# Patient Record
Sex: Male | Born: 1935 | Race: White | Hispanic: No | Marital: Married | Smoking: Never smoker
Health system: Southern US, Community
[De-identification: ages and names within clinical notes are randomized; demographics above are authoritative.]

## PROBLEM LIST (undated history)

## (undated) DIAGNOSIS — I4891 Unspecified atrial fibrillation: Secondary | ICD-10-CM

## (undated) DIAGNOSIS — K279 Peptic ulcer, site unspecified, unspecified as acute or chronic, without hemorrhage or perforation: Secondary | ICD-10-CM

## (undated) DIAGNOSIS — I119 Hypertensive heart disease without heart failure: Secondary | ICD-10-CM

## (undated) DIAGNOSIS — I10 Essential (primary) hypertension: Secondary | ICD-10-CM

## (undated) DIAGNOSIS — I5042 Chronic combined systolic (congestive) and diastolic (congestive) heart failure: Secondary | ICD-10-CM

## (undated) DIAGNOSIS — J90 Pleural effusion, not elsewhere classified: Secondary | ICD-10-CM

## (undated) DIAGNOSIS — I495 Sick sinus syndrome: Secondary | ICD-10-CM

## (undated) DIAGNOSIS — Z950 Presence of cardiac pacemaker: Secondary | ICD-10-CM

## (undated) DIAGNOSIS — N183 Chronic kidney disease, stage 3 unspecified: Secondary | ICD-10-CM

## (undated) DIAGNOSIS — E669 Obesity, unspecified: Secondary | ICD-10-CM

## (undated) DIAGNOSIS — I255 Ischemic cardiomyopathy: Secondary | ICD-10-CM

## (undated) DIAGNOSIS — E119 Type 2 diabetes mellitus without complications: Secondary | ICD-10-CM

## (undated) DIAGNOSIS — N400 Enlarged prostate without lower urinary tract symptoms: Secondary | ICD-10-CM

## (undated) DIAGNOSIS — I2699 Other pulmonary embolism without acute cor pulmonale: Secondary | ICD-10-CM

## (undated) DIAGNOSIS — D649 Anemia, unspecified: Secondary | ICD-10-CM

## (undated) DIAGNOSIS — I219 Acute myocardial infarction, unspecified: Secondary | ICD-10-CM

## (undated) DIAGNOSIS — I340 Nonrheumatic mitral (valve) insufficiency: Secondary | ICD-10-CM

## (undated) DIAGNOSIS — K449 Diaphragmatic hernia without obstruction or gangrene: Secondary | ICD-10-CM

## (undated) DIAGNOSIS — F419 Anxiety disorder, unspecified: Secondary | ICD-10-CM

---

## 2006-05-07 ENCOUNTER — Inpatient Hospital Stay: Payer: Self-pay | Admitting: Internal Medicine

## 2006-05-07 ENCOUNTER — Other Ambulatory Visit: Payer: Self-pay

## 2010-07-28 ENCOUNTER — Ambulatory Visit: Payer: Self-pay | Admitting: Gastroenterology

## 2010-07-29 LAB — PATHOLOGY REPORT

## 2014-04-16 ENCOUNTER — Inpatient Hospital Stay: Payer: Self-pay | Admitting: Internal Medicine

## 2014-04-16 LAB — COMPREHENSIVE METABOLIC PANEL
ALBUMIN: 3.6 g/dL (ref 3.4–5.0)
ALK PHOS: 64 U/L
ALT: 49 U/L (ref 12–78)
ANION GAP: 5 — AB (ref 7–16)
AST: 26 U/L (ref 15–37)
BILIRUBIN TOTAL: 1 mg/dL (ref 0.2–1.0)
BUN: 46 mg/dL — ABNORMAL HIGH (ref 7–18)
CHLORIDE: 112 mmol/L — AB (ref 98–107)
Calcium, Total: 8.4 mg/dL — ABNORMAL LOW (ref 8.5–10.1)
Co2: 22 mmol/L (ref 21–32)
Creatinine: 1.93 mg/dL — ABNORMAL HIGH (ref 0.60–1.30)
EGFR (African American): 37 — ABNORMAL LOW
EGFR (Non-African Amer.): 32 — ABNORMAL LOW
Glucose: 172 mg/dL — ABNORMAL HIGH (ref 65–99)
Osmolality: 294 (ref 275–301)
Potassium: 5.4 mmol/L — ABNORMAL HIGH (ref 3.5–5.1)
SODIUM: 139 mmol/L (ref 136–145)
Total Protein: 6.7 g/dL (ref 6.4–8.2)

## 2014-04-16 LAB — CBC WITH DIFFERENTIAL/PLATELET
Basophil #: 0.1 10*3/uL (ref 0.0–0.1)
Basophil %: 0.8 %
EOS PCT: 2.9 %
Eosinophil #: 0.2 10*3/uL (ref 0.0–0.7)
HCT: 26.8 % — AB (ref 40.0–52.0)
HGB: 8.9 g/dL — ABNORMAL LOW (ref 13.0–18.0)
Lymphocyte #: 1.2 10*3/uL (ref 1.0–3.6)
Lymphocyte %: 15.3 %
MCH: 32 pg (ref 26.0–34.0)
MCHC: 33.3 g/dL (ref 32.0–36.0)
MCV: 96 fL (ref 80–100)
MONOS PCT: 5.6 %
Monocyte #: 0.4 x10 3/mm (ref 0.2–1.0)
Neutrophil #: 5.8 10*3/uL (ref 1.4–6.5)
Neutrophil %: 75.4 %
Platelet: 135 10*3/uL — ABNORMAL LOW (ref 150–440)
RBC: 2.79 10*6/uL — AB (ref 4.40–5.90)
RDW: 14.9 % — ABNORMAL HIGH (ref 11.5–14.5)
WBC: 7.6 10*3/uL (ref 3.8–10.6)

## 2014-04-16 LAB — TROPONIN I: Troponin-I: <0.02 ng/mL

## 2014-04-16 LAB — PRO B NATRIURETIC PEPTIDE: B-Type Natriuretic Peptide: 8307 pg/mL — ABNORMAL HIGH (ref 0–450)

## 2014-04-17 LAB — CBC WITH DIFFERENTIAL/PLATELET
BASOS ABS: 0.1 10*3/uL (ref 0.0–0.1)
Basophil %: 0.8 %
EOS ABS: 0.2 10*3/uL (ref 0.0–0.7)
Eosinophil %: 2.6 %
HCT: 25.4 % — AB (ref 40.0–52.0)
HGB: 8.4 g/dL — AB (ref 13.0–18.0)
Lymphocyte #: 1.3 10*3/uL (ref 1.0–3.6)
Lymphocyte %: 19.4 %
MCH: 31.3 pg (ref 26.0–34.0)
MCHC: 33 g/dL (ref 32.0–36.0)
MCV: 95 fL (ref 80–100)
MONOS PCT: 6.8 %
Monocyte #: 0.5 x10 3/mm (ref 0.2–1.0)
Neutrophil #: 4.7 10*3/uL (ref 1.4–6.5)
Neutrophil %: 70.4 %
Platelet: 122 10*3/uL — ABNORMAL LOW (ref 150–440)
RBC: 2.68 10*6/uL — ABNORMAL LOW (ref 4.40–5.90)
RDW: 14.6 % — ABNORMAL HIGH (ref 11.5–14.5)
WBC: 6.7 10*3/uL (ref 3.8–10.6)

## 2014-04-17 LAB — IRON AND TIBC
IRON SATURATION: 8 %
IRON: 21 ug/dL — AB (ref 65–175)
Iron Bind.Cap.(Total): 248 ug/dL — ABNORMAL LOW (ref 250–450)
Unbound Iron-Bind.Cap.: 227 ug/dL

## 2014-04-17 LAB — BASIC METABOLIC PANEL
Anion Gap: 4 — ABNORMAL LOW (ref 7–16)
BUN: 46 mg/dL — ABNORMAL HIGH (ref 7–18)
CALCIUM: 8.5 mg/dL (ref 8.5–10.1)
CHLORIDE: 109 mmol/L — AB (ref 98–107)
CO2: 27 mmol/L (ref 21–32)
Creatinine: 1.84 mg/dL — ABNORMAL HIGH (ref 0.60–1.30)
EGFR (Non-African Amer.): 34 — ABNORMAL LOW
GFR CALC AF AMER: 39 — AB
Glucose: 105 mg/dL — ABNORMAL HIGH (ref 65–99)
Osmolality: 292 (ref 275–301)
Potassium: 4.6 mmol/L (ref 3.5–5.1)
Sodium: 140 mmol/L (ref 136–145)

## 2014-04-17 LAB — FERRITIN: Ferritin (ARMC): 45 ng/mL (ref 8–388)

## 2014-04-18 LAB — CBC WITH DIFFERENTIAL/PLATELET
BASOS PCT: 1.1 %
Basophil #: 0.1 10*3/uL (ref 0.0–0.1)
EOS PCT: 3.5 %
Eosinophil #: 0.2 10*3/uL (ref 0.0–0.7)
HCT: 24.6 % — ABNORMAL LOW (ref 40.0–52.0)
HGB: 8.6 g/dL — ABNORMAL LOW (ref 13.0–18.0)
Lymphocyte #: 1.4 10*3/uL (ref 1.0–3.6)
Lymphocyte %: 22.5 %
MCH: 32.4 pg (ref 26.0–34.0)
MCHC: 34.9 g/dL (ref 32.0–36.0)
MCV: 93 fL (ref 80–100)
Monocyte #: 0.4 x10 3/mm (ref 0.2–1.0)
Monocyte %: 6.4 %
NEUTROS ABS: 4 10*3/uL (ref 1.4–6.5)
NEUTROS PCT: 66.5 %
Platelet: 132 10*3/uL — ABNORMAL LOW (ref 150–440)
RBC: 2.66 10*6/uL — ABNORMAL LOW (ref 4.40–5.90)
RDW: 14.4 % (ref 11.5–14.5)
WBC: 6 10*3/uL (ref 3.8–10.6)

## 2014-04-18 LAB — BASIC METABOLIC PANEL
Anion Gap: 6 — ABNORMAL LOW (ref 7–16)
BUN: 42 mg/dL — ABNORMAL HIGH (ref 7–18)
Calcium, Total: 8.4 mg/dL — ABNORMAL LOW (ref 8.5–10.1)
Chloride: 106 mmol/L (ref 98–107)
Co2: 27 mmol/L (ref 21–32)
Creatinine: 1.77 mg/dL — ABNORMAL HIGH (ref 0.60–1.30)
GFR CALC AF AMER: 41 — AB
GFR CALC NON AF AMER: 35 — AB
Glucose: 122 mg/dL — ABNORMAL HIGH (ref 65–99)
Osmolality: 289 (ref 275–301)
POTASSIUM: 4.3 mmol/L (ref 3.5–5.1)
SODIUM: 139 mmol/L (ref 136–145)

## 2014-04-18 LAB — PROTIME-INR
INR: 1.2
PROTHROMBIN TIME: 14.8 s — AB (ref 11.5–14.7)

## 2014-04-18 LAB — APTT: ACTIVATED PTT: 37.7 s — AB (ref 23.6–35.9)

## 2014-04-19 HISTORY — PX: INSERT / REPLACE / REMOVE PACEMAKER: SUR710

## 2014-04-19 LAB — BASIC METABOLIC PANEL
ANION GAP: 8 (ref 7–16)
BUN: 32 mg/dL — ABNORMAL HIGH (ref 7–18)
CALCIUM: 8.9 mg/dL (ref 8.5–10.1)
CHLORIDE: 103 mmol/L (ref 98–107)
Co2: 29 mmol/L (ref 21–32)
Creatinine: 1.58 mg/dL — ABNORMAL HIGH (ref 0.60–1.30)
GFR CALC AF AMER: 47 — AB
GFR CALC NON AF AMER: 40 — AB
GLUCOSE: 113 mg/dL — AB (ref 65–99)
Osmolality: 287 (ref 275–301)
Potassium: 3.9 mmol/L (ref 3.5–5.1)
SODIUM: 140 mmol/L (ref 136–145)

## 2014-04-19 LAB — HEMOGLOBIN: HGB: 10.3 g/dL — ABNORMAL LOW (ref 13.0–18.0)

## 2014-06-25 DIAGNOSIS — I495 Sick sinus syndrome: Secondary | ICD-10-CM | POA: Insufficient documentation

## 2014-06-25 DIAGNOSIS — I2510 Atherosclerotic heart disease of native coronary artery without angina pectoris: Secondary | ICD-10-CM | POA: Insufficient documentation

## 2014-06-25 DIAGNOSIS — E119 Type 2 diabetes mellitus without complications: Secondary | ICD-10-CM | POA: Insufficient documentation

## 2014-06-25 DIAGNOSIS — D649 Anemia, unspecified: Secondary | ICD-10-CM | POA: Insufficient documentation

## 2015-01-14 DIAGNOSIS — I482 Chronic atrial fibrillation, unspecified: Secondary | ICD-10-CM | POA: Insufficient documentation

## 2015-01-14 DIAGNOSIS — N183 Chronic kidney disease, stage 3 unspecified: Secondary | ICD-10-CM | POA: Insufficient documentation

## 2015-02-19 ENCOUNTER — Ambulatory Visit: Payer: Self-pay | Admitting: Internal Medicine

## 2015-02-25 ENCOUNTER — Ambulatory Visit
Admit: 2015-02-25 | Disposition: A | Discharge status: Home / Self Care | Payer: Self-pay | Attending: Internal Medicine | Admitting: Internal Medicine

## 2015-03-28 ENCOUNTER — Ambulatory Visit
Admit: 2015-03-28 | Disposition: A | Discharge status: Home / Self Care | Payer: Self-pay | Attending: Internal Medicine | Admitting: Internal Medicine

## 2015-04-19 NOTE — Op Note (Signed)
PATIENT NAME:  Donald Henry, Donald Henry MR#:  [id number] DATE OF BIRTH:  17-Sep-1930  DATE OF PROCEDURE:  04/18/2014  PRIMARY CARE PHYSICIAN:  Dr. Gilford Rile.  PREPROCEDURE DIAGNOSIS: Sick sinus syndrome, atrial fibrillation.   PROCEDURE: Single-chamber pacemaker implantation.   POSTPROCEDURE DIAGNOSIS: Ventricular pacing.   INDICATION: The patient is an 81 year old gentleman who presents with a 3 day history of shortness of breath. In the Emergency Room, the patient was noted to be bradycardic. EKG revealed atrial fibrillation with a slow ventricular rate in the 30s. Following admission clonidine was withheld, however, the patient has remained bradycardic with symptoms of shortness of breath. The procedure, risks, benefits, and alternatives of pacemaker implantation were explained to the patient and informed written consent was obtained.   DESCRIPTION OF PROCEDURE: He was brought to the operating room in a fasting state. The left pectoral region was prepped and draped in the usual sterile manner. Anesthesia was obtained with 1% Xylocaine locally. A 6 cm incision was performed over the left pectoral region. The pacemaker pocket was generated by electrocautery and blunt dissection. Access was obtained to the left subclavian vein by fine needle aspiration. A ventricular lead (Medtronic [id number]) was positioned to right ventricular apex, apical septum. After proper thresholds were obtained, the lead was sutured in place. The lead was connected to a single-chamber rate responsive pacemaker generator (Medtronic Adapta [id number]). The pacemaker pocket was irrigated with gentamicin solution. The pacemaker generator was positioned into the pocket and the pocket was closed with 2-0 and 4-0 Vicryl, respectively. Steri-Strips and pressure dressing were applied.   ____________________________ Isaias Cowman, MD ap:sb D: 04/18/2014 14:29:06 ET T: 04/18/2014 14:47:44 ET JOB#: [id number]  cc: Isaias Cowman, MD,  <Dictator> Isaias Cowman MD ELECTRONICALLY SIGNED 04/30/2014 12:46

## 2015-04-19 NOTE — Consult Note (Signed)
General Aspect 81 year old male admitted with 3 day history of shortness of breath with EKG showing new onset fibrillation with slow ventricular response in the 30s as well as chest x-ray showing  evidence for congestive heart failure. Patient states he went out for the paper this morning and thought he would not make it back to the house.  As long as he sitting or lying quietly he is not symptomatic. He denies having any any evidence of heart failure in the past as well.  He is on atenolol but no other chronotropic type drugs, which he has been on for years,his last dose was last night.  He also has chronic kidney disease with a creatinine around 1.93. Otherwise is been in his usual state of health. No sensation of dizziness or presyncope. Troponin negative patient is anemic, with hemoglobin of 8.9, potassium 5.4. Echocardiogram is pending.   Physical Exam:  GEN well developed, no acute distress   HEENT pink conjunctivae   NECK supple   RESP normal resp effort   CARD Irregular rate and rhythm  Bradycardic   ABD denies tenderness  soft   EXTR negative edema   SKIN skin turgor decreased   NEURO cranial nerves intact, motor/sensory function intact   PSYCH alert, A+O to time, place, person, good insight   Review of Systems:  Subjective/Chief Complaint Shortness of breath   Respiratory: Short of breath  x3 days   Cardiovascular: Dyspnea   Review of Systems: All other systems were reviewed and found to be negative   Medications/Allergies Reviewed Medications/Allergies reviewed   Family & Social History:  Family and Social History:  Family History Non-Contributory   Social History negative tobacco, negative ETOH   Place of Living Home   Lab Results: Hepatic:  21-Apr-15 10:29   Bilirubin, Total 1.0  Alkaline Phosphatase 64 (45-117 NOTE: New Reference Range 11/16/13)  SGPT (ALT) 49  SGOT (AST) 26  Total Protein, Serum 6.7  Albumin, Serum 3.6  Routine Chem:   21-Apr-15 10:29   B-Type Natriuretic Peptide ([facility])  8307 (Result(s) reported on 16 Apr 2014 at 11:01AM.)  Glucose, Serum  172  BUN  46  Creatinine (comp)  1.93  Sodium, Serum 139  Potassium, Serum  5.4  Chloride, Serum  112  CO2, Serum 22  Calcium (Total), Serum  8.4  Osmolality (calc) 294  eGFR (African American)  37  eGFR (Non-African American)  32 (eGFR values <56m/min/1.73 m2 may be an indication of chronic kidney disease (CKD). Calculated eGFR is useful in patients with stable renal function. The eGFR calculation will not be reliable in acutely ill patients when serum creatinine is changing rapidly. It is not useful in  patients on dialysis. The eGFR calculation may not be applicable to patients at the low and high extremes of body sizes, pregnant women, and vegetarians.)  Anion Gap  5  Cardiac:  21-Apr-15 10:29   Troponin I < 0.02 (0.00-0.05 0.05 ng/mL or less: NEGATIVE  Repeat testing in 3-6 hrs  if clinically indicated. >0.05 ng/mL: POTENTIAL  MYOCARDIAL INJURY. Repeat  testing in 3-6 hrs if  clinically indicated. NOTE: An increase or decrease  of 30% or more on serial  testing suggests a  clinically important change)  Routine Hem:  21-Apr-15 10:29   WBC (CBC) 7.6  RBC (CBC)  2.79  Hemoglobin (CBC)  8.9  Hematocrit (CBC)  26.8  Platelet Count (CBC)  135  MCV 96  MCH 32.0  MCHC 33.3  RDW  14.9  Neutrophil % 75.4  Lymphocyte % 15.3  Monocyte % 5.6  Eosinophil % 2.9  Basophil % 0.8  Neutrophil # 5.8  Lymphocyte # 1.2  Monocyte # 0.4  Eosinophil # 0.2  Basophil # 0.1 (Result(s) reported on 16 Apr 2014 at 10:47AM.)   Radiology Results: XRay:    21-Apr-15 11:06, Chest Portable Single View  Chest Portable Single View   REASON FOR EXAM:    SHORT OF BREATH  COMMENTS:       PROCEDURE: DXR - DXR PORTABLE CHEST SINGLE VIEW  - Apr 16 2014 11:06AM     CLINICAL DATA:  Shortness of breath    EXAM:  PORTABLE CHEST - 1 VIEW    COMPARISON:   None.    FINDINGS:  There is bilateral diffuse interstitial thickening likely  representing interstitial edema versus interstitial pneumonitis  secondary to an infectious or inflammatory etiology. There is no  focal parenchymal opacity, pleural effusion, or pneumothorax. The  heart and mediastinal contours are unremarkable.    The osseous structures are unremarkable.     IMPRESSION:  There is bilateral diffuse interstitial thickening likely  representing interstitial edema versus interstitial pneumonitis  secondary to an infectious or inflammatory etiology.      Electronically Signed    By: Kathreen Devoid    On: 04/16/2014 11:16     Verified By: Jennette Banker, M.D., MD    Aspirin: GI Distress  Vital Signs/Nurse's Notes: **Vital Signs.:   21-Apr-15 14:10  Vital Signs Type Admission  Temperature Temperature (F) 97.9  Celsius 36.6  Pulse Pulse 42  Respirations Respirations 18  Systolic BP Systolic BP 938  Diastolic BP (mmHg) Diastolic BP (mmHg) 59  Mean BP 82  Pulse Ox % Pulse Ox % 97  Pulse Ox Activity Level  At rest  Oxygen Delivery 2L    Impression 81 year old male with new onset of atrial fibrillation with slow ventricular response as well as congestive heart failure probably driven by bradycardia.   Plan 1.  Echocardiogram is pending. 2.  Atenolol is on hold to see affect on ventricular rate.  If continues with sick sinus syndrome with symptomatic bradycardia may be a pacemaker candidate. 3.  If atrial fibrillation continues, will have to consider patient for long-term anticoagulant. 4.  Further recommendations per Dr. [name] .   Electronic Signatures: Roderic Palau (NP)  (Signed 21-Apr-15 17:31)  Authored: General Aspect/Present Illness, History and Physical Exam, Review of System, Family & Social History, Labs, Radiology, Allergies, Vital Signs/Nurse's Notes, Impression/Plan   Last Updated: 21-Apr-15 17:31 by Roderic Palau (NP)

## 2015-04-19 NOTE — Discharge Summary (Signed)
PATIENT NAME:  Donald Henry, Donald Henry MR#:  [id number] DATE OF BIRTH:  Jun 02, 1930  DATE OF ADMISSION:  04/16/2014 DATE OF DISCHARGE:  04/19/2014  HISTORY OF PRESENT ILLNESS: Donald Henry was an 81 year old white gentleman with known coronary artery disease who presented to the Emergency Room with increasing shortness of breath. In the Emergency Room, he was noted to have pulmonary edema on chest x-ray. He was also noted to have a slow heart rate in the 30s to 40s. The patient was therefore admitted for further evaluation and treatment.   PAST MEDICAL HISTORY: Type 2 diabetes, hyperlipidemia, coronary artery disease, peptic ulcer disease, benign prosthetic hypertrophy, obesity, chronic anemia, chronic kidney disease, and new history of atrial fibrillation.   ALLERGIES: No known drug allergies.   MEDICATIONS ON ADMISSION: Included simvastatin 40 mg daily, metformin 1000 mg daily, glipizide 10 mg daily, atenolol 25 mg daily, chlorthalidone 25 mg daily, omeprazole 20 mg daily, and terazosin 2 mg daily.   ADMISSION PHYSICAL EXAMINATION: As described by the admitting physician, revealed a temperature of 97, a pulse of 56, respirations 18, and a blood pressure of 137/55. Despite his heart failure he had a pulse ox of 95% on room air. There was no JVD noted. He was noted to have crackles in both lung bases. He was also noted to have a slow, irregular rate. He did have 2+ pitting edema.   DIAGNOSTIC DATA: Admission CBC showed a hemoglobin of 8.9 with a hematocrit of 26.8. White count was 7600. Platelet count was 135,000. Admission basic metabolic panel showed a BUN of 46 with a creatinine 1.93. Blood sugar was 172, sodium 139, potassium 5.4, chloride 112, bicarb 22, and a calcium 8.4. Liver function studies were within normal limits. Troponin was less than 0.02. BNP was 8307.   Chest x-ray showed bilateral diffuse interstitial thickening.   EKG showed atrial fibrillation with a slow ventricular response.    HOSPITAL COURSE: The patient was admitted to the regular floor where he was placed on telemetry. He was treated with IV diuretics for his mild congestive heart failure. He was seen in consultation by cardiology and eventually underwent cardiac pacemaker placement due to pauses in his rhythm up to 3 seconds. The patient was eventually converted to p.o. medication. Echocardiogram showed a left ventricular ejection fraction of 55% to 60%. He did have a moderately dilated left atrium, right atrium. He had moderately elevated pulmonary artery pressures. There was moderate mitral valve regurgitation.   DISCHARGE DIAGNOSES: 1.  Sick sinus syndrome.  2.  Acute systolic congestive heart failure.   DISCHARGE MEDICATIONS: The patient is continued on his preadmission medications with the exception of chlorthalidone which was replaced by furosemide 20 mg once a day.   DISCHARGE DIET: The patient was discharged on a low sodium diet. He is also to follow an ADA diet.   DISCHARGE FOLLOWUP: He is to be followed up in the office in 1 to 2 days by Dr. [name] in cardiology. He is to keep his regular appointment with me later in the year.  ____________________________ Hewitt Blade. Sarina Ser, MD jbw:sb D: 05/07/2014 14:01:12 ET T: 05/07/2014 16:12:16 ET JOB#: [id number]  cc: Jenny Reichmann B. Sarina Ser, MD, <Dictator> Lottie Mussel III MD ELECTRONICALLY SIGNED 05/14/2014 8:08

## 2015-04-19 NOTE — H&P (Signed)
PATIENT NAME:  Donald Henry, Donald Henry MR#:  [id number] DATE OF BIRTH:  [birth year]  DATE OF ADMISSION:  04/16/2014  ADMITTING PHYSICIAN:  Dr. Gladstone Lighter.  PRIMARY CARE PHYSICIAN: Dr. Lisette Grinder.   CHIEF COMPLAINT: Difficulty breathing.   HISTORY OF PRESENT ILLNESS: Donald Henry is an 81 year old, very pleasant Caucasian male with past medical history significant for non-insulin-dependent diabetes mellitus, hyperlipidemia, coronary artery disease, peptic ulcer disease in the past, chronic kidney disease, who presents to the hospital secondary to dyspnea going on for 3 days now. The patient said he never had a prior diagnosis of congestive heart failure or atrial fibrillation. He started noticing difficulty breathing on Sunday, which was 2 days ago from now, and then it gradually got worse; that when he woke up this morning, he could not breathe and decided to come to the ER. He denies any chest pain. No palpitations, nausea, vomiting or diaphoresis. He is noted to have pulmonary edema on a chest x-ray, and he is being admitted for congestive heart failure exacerbation. Also of note, the patient's heart rate has been in the 30 to 45 range while in the hospital. He denies any dizziness, lightheadedness, chest pain secondary to that. He received a dose of atropine for heart rate less than 30 here once in the Emergency Room. His rhythm is noted to be atrial fibrillation.   PAST MEDICAL HISTORY: 1.  Atrial fibrillation. No prior diagnosis.  2.  Diabetes mellitus, non-insulin-dependent.  3.  Hyperlipidemia.  4.  Coronary artery disease, status post myocardial infarction without any intervention other than medical therapy in 1984.  5.  Peptic ulcer disease.  6.  Benign prostatic hypertrophy.  7.  Obesity.  8.  Chronic anemia.  9.  Chronic kidney disease with baseline creatinine of 1.9.  10.  Anemia of chronic disease.  PAST SURGICAL HISTORY: None.   ALLERGIES TO MEDICATIONS: HE WAS ADVISED TO  STOP ASPIRIN WHEN HE HAD THE PEPTIC ULCER DISEASE, BUT OTHERWISE ASPIRIN DOES NOT CAUSE ANY REACTIONS TO HIM.   CURRENT HOME MEDICATIONS:  1.  Simvastatin 40 mg p.o. daily.  2.  Metformin 1000 mg p.o. daily.  3.  Glipizide 10 mg p.o. daily.  4.  Atenolol 25 mg p.o. daily.  5.  Chlorthalidone 25 mg a daily.  6.  Omeprazole 20 mg p.o. daily.  7.  Terazosin 2 mg p.o. daily.   SOCIAL HISTORY: Lives at home with his wife. No history of any smoking. He is a retired Engineer, building services. He drinks occasional red wine.   FAMILY HISTORY: Mom lived up to age 40 and died of natural causes and dad with heart disease in his 38s.   REVIEW OF SYSTEMS:    CONSTITUTIONAL: No fever, fatigue or weakness.  EYES: No blurred vision, double vision, inflammation, glaucoma or cataracts.  ENT: No tinnitus, ear pain, hearing loss, epistaxis or discharge.  RESPIRATORY: No cough, wheeze, hemoptysis or COPD.  CARDIOVASCULAR: No chest pain. Positive for orthopnea and pedal edema. Positive for dyspnea on exertion.  GASTROINTESTINAL: No nausea, vomiting, diarrhea, abdominal pain, hematemesis or melena.  GENITOURINARY: No dysuria, hematuria, renal calculus, frequently or incontinence.  ENDOCRINE: No polyuria, nocturia, thyroid problems, heat or cold tolerance.  HEMATOLOGY: No anemia, easy bruising or bleeding.  SKIN: No acne, rash or lesions.  MUSCULOSKELETAL: No neck, back, shoulder pain, arthritis or gout.  NEUROLOGIC: No numbness, weakness, CVA, TIA or seizures.  PSYCHOLOGICAL: No anxiety, insomnia, depression.   PHYSICAL EXAMINATION: VITAL SIGNS: Temperature 97 degrees Fahrenheit, pulse 56,  respirations 18, blood pressure 137/55, pulse ox 95% on room air.  GENERAL: Well-built, well-nourished male, sitting in bed, not in any acute distress.  HEENT: Normocephalic, atraumatic. Pupils equal, round, reacting to light. Anicteric sclerae. Extraocular movements intact. Oropharynx clear without any erythema, mass or exudates.   NECK: Supple. No thyromegaly, JVD or carotid bruits. No lymphadenopathy. Normal range of motion without pain.  LUNGS: Moving air bilaterally. Coarse crackles heard, posterior around two thirds of the lungs. No wheezes. No use of accessory muscles for breathing.  CARDIOVASCULAR: S1, S2. Slow rate and irregular rhythm. No murmurs, rubs or gallops.  ABDOMEN: Soft, nontender, nondistended. No hepatosplenomegaly. Normal bowel sounds.  EXTREMITIES: 2+ pitting edema. No clubbing or cyanosis, 3+ dorsalis pedis pulses palpable bilaterally.  SKIN: No acne, rash or lesions.  LYMPHATICS: No cervical or inguinal lymphadenopathy.  NEUROLOGIC: Cranial nerves intact. No focal motor or sensory deficits.  PSYCHOLOGICAL: The patient is awake, alert, oriented x 3.   LABORATORY DATA: WBC 7.6, hemoglobin 8.9, hematocrit 26.8, platelet count 135.   Sodium 139, potassium 5.4, chloride 112, bicarb 22, BUN 46, creatinine 1.93, glucose 172 and calcium of 8.4.   ALT 49, AST 26, alk phos 64, total bili 1.0 and albumin of 3.6. Troponin less than 0.02. BNP is 8307.    Chest x-ray showing bilateral diffuse interstitial thickening, likely interstitial edema or pneumonitis.  EKG showing atrial fibrillation with slow ventricular response; heart rate of 41. No acute ST-T wave changes noted.   ASSESSMENT AND PLAN: This is an 81 year old male with history of diabetes, hypertension, coronary artery disease, gastroesophageal reflux disease, comes with dyspnea on exertion and noted to be in atrial fib with slow ventricular response and also acute congestive heart failure.  1.  Acute congestive heart failure exacerbation. No prior history, unknown ejection fraction. We will admit to telemetry, get an echocardiogram. Cardiology has been consulted and started on IV Lasix b.i.d. at this time.  2.  Atrial fib with slow ventricular response, likely has underlying sick sinus syndrome and will need pacemaker at some point. Currently, heart  rate in the 30 to 40 range. The patient is completely asymptomatic. He took atenolol at home; last dose was last night, so hopefully will clear off his system maybe later in the day, especially likely due to his CKD.  Hold off on any beta blockers or calcium channel blockers, monitor on telemetry, and have atropine and pacer pads at bedside p.r.n.  3.  Chronic kidney disease stage III. Creatinine appears to be at baseline. Continue to monitor.  4.  Hyperlipidemia, continue statin.  5.  Diabetes mellitus, non-insulin-dependent. Continue with glipizide and sliding scale insulin is added here.  Hold metformin due to his CKD. 6.  Hypertension. Continue chlorthalidone for now. Atenolol is held.  7.  Gastroesophageal reflux disease.  Continue PPI on Protonix.   Code status of the patient is FULL CODE.   Time spent on admission is 50 minutes.     ____________________________ Gladstone Lighter, MD rk:dmm D: 04/16/2014 11:59:00 ET T: 04/16/2014 12:20:35 ET JOB#: [id number]  cc: Gladstone Lighter, MD, <Dictator> John B. Sarina Ser, MD Corey Skains, MD Gladstone Lighter MD ELECTRONICALLY SIGNED 04/16/2014 13:27

## 2015-05-22 DIAGNOSIS — I10 Essential (primary) hypertension: Secondary | ICD-10-CM | POA: Insufficient documentation

## 2015-10-29 DIAGNOSIS — I071 Rheumatic tricuspid insufficiency: Secondary | ICD-10-CM | POA: Insufficient documentation

## 2015-10-29 DIAGNOSIS — I340 Nonrheumatic mitral (valve) insufficiency: Secondary | ICD-10-CM | POA: Insufficient documentation

## 2015-11-15 ENCOUNTER — Emergency Department: Payer: Medicare Other

## 2015-11-15 ENCOUNTER — Emergency Department
Admission: EM | Admit: 2015-11-15 | Discharge: 2015-11-15 | Disposition: A | Discharge status: Home / Self Care | Payer: Medicare Other | Source: Home / Self Care | Attending: Emergency Medicine | Admitting: Emergency Medicine

## 2015-11-15 ENCOUNTER — Encounter: Payer: Self-pay | Admitting: Emergency Medicine

## 2015-11-15 DIAGNOSIS — Y9289 Other specified places as the place of occurrence of the external cause: Secondary | ICD-10-CM | POA: Insufficient documentation

## 2015-11-15 DIAGNOSIS — W101XXA Fall (on)(from) sidewalk curb, initial encounter: Secondary | ICD-10-CM

## 2015-11-15 DIAGNOSIS — S0990XA Unspecified injury of head, initial encounter: Secondary | ICD-10-CM | POA: Insufficient documentation

## 2015-11-15 DIAGNOSIS — Z950 Presence of cardiac pacemaker: Secondary | ICD-10-CM | POA: Diagnosis not present

## 2015-11-15 DIAGNOSIS — Y998 Other external cause status: Secondary | ICD-10-CM

## 2015-11-15 DIAGNOSIS — Z8249 Family history of ischemic heart disease and other diseases of the circulatory system: Secondary | ICD-10-CM | POA: Diagnosis not present

## 2015-11-15 DIAGNOSIS — S60511A Abrasion of right hand, initial encounter: Secondary | ICD-10-CM

## 2015-11-15 DIAGNOSIS — S01111A Laceration without foreign body of right eyelid and periocular area, initial encounter: Secondary | ICD-10-CM | POA: Insufficient documentation

## 2015-11-15 DIAGNOSIS — E119 Type 2 diabetes mellitus without complications: Secondary | ICD-10-CM

## 2015-11-15 DIAGNOSIS — W19XXXA Unspecified fall, initial encounter: Secondary | ICD-10-CM

## 2015-11-15 DIAGNOSIS — S80211A Abrasion, right knee, initial encounter: Secondary | ICD-10-CM

## 2015-11-15 DIAGNOSIS — I214 Non-ST elevation (NSTEMI) myocardial infarction: Secondary | ICD-10-CM | POA: Diagnosis not present

## 2015-11-15 DIAGNOSIS — Z23 Encounter for immunization: Secondary | ICD-10-CM | POA: Insufficient documentation

## 2015-11-15 DIAGNOSIS — Y9301 Activity, walking, marching and hiking: Secondary | ICD-10-CM

## 2015-11-15 DIAGNOSIS — S80212A Abrasion, left knee, initial encounter: Secondary | ICD-10-CM | POA: Insufficient documentation

## 2015-11-15 DIAGNOSIS — I252 Old myocardial infarction: Secondary | ICD-10-CM | POA: Diagnosis not present

## 2015-11-15 DIAGNOSIS — S20211A Contusion of right front wall of thorax, initial encounter: Secondary | ICD-10-CM | POA: Insufficient documentation

## 2015-11-15 DIAGNOSIS — I420 Dilated cardiomyopathy: Secondary | ICD-10-CM | POA: Diagnosis not present

## 2015-11-15 DIAGNOSIS — I495 Sick sinus syndrome: Secondary | ICD-10-CM | POA: Diagnosis not present

## 2015-11-15 DIAGNOSIS — I5022 Chronic systolic (congestive) heart failure: Secondary | ICD-10-CM | POA: Diagnosis not present

## 2015-11-15 DIAGNOSIS — I2699 Other pulmonary embolism without acute cor pulmonale: Secondary | ICD-10-CM | POA: Diagnosis not present

## 2015-11-15 DIAGNOSIS — Z79899 Other long term (current) drug therapy: Secondary | ICD-10-CM | POA: Diagnosis not present

## 2015-11-15 DIAGNOSIS — T148XXA Other injury of unspecified body region, initial encounter: Secondary | ICD-10-CM

## 2015-11-15 DIAGNOSIS — I2510 Atherosclerotic heart disease of native coronary artery without angina pectoris: Secondary | ICD-10-CM | POA: Diagnosis not present

## 2015-11-15 DIAGNOSIS — I480 Paroxysmal atrial fibrillation: Secondary | ICD-10-CM | POA: Diagnosis not present

## 2015-11-15 DIAGNOSIS — I10 Essential (primary) hypertension: Secondary | ICD-10-CM | POA: Diagnosis not present

## 2015-11-15 DIAGNOSIS — K219 Gastro-esophageal reflux disease without esophagitis: Secondary | ICD-10-CM | POA: Diagnosis not present

## 2015-11-15 HISTORY — DX: Type 2 diabetes mellitus without complications: E11.9

## 2015-11-15 MED ORDER — BACITRACIN ZINC 500 UNIT/GM EX OINT
TOPICAL_OINTMENT | CUTANEOUS | Status: AC
  Administered 2015-11-15: 1
  Filled 2015-11-15: qty 0.9

## 2015-11-15 MED ORDER — TRAMADOL HCL 50 MG PO TABS
50.0000 mg | ORAL_TABLET | Freq: Once | ORAL | Status: AC
  Administered 2015-11-15: 50 mg via ORAL
  Filled 2015-11-15: qty 1

## 2015-11-15 MED ORDER — TETANUS-DIPHTH-ACELL PERTUSSIS 5-2.5-18.5 LF-MCG/0.5 IM SUSP
0.5000 mL | Freq: Once | INTRAMUSCULAR | Status: AC
  Administered 2015-11-15: 0.5 mL via INTRAMUSCULAR
  Filled 2015-11-15: qty 0.5

## 2015-11-15 MED ORDER — TRAMADOL HCL 50 MG PO TABS: 50.0000 mg | ORAL_TABLET | Freq: Four times a day (QID) | ORAL | Status: DC | PRN

## 2015-11-15 NOTE — ED Provider Notes (Signed)
[facility] Emergency Department Provider Note    ____________________________________________  Time seen: 102  I have reviewed the triage vital signs and the nursing notes.   HISTORY  Chief Complaint Fall and Head Injury   History limited by: Not Limited   HPI Donald Henry is a 81 y.o. male who presents to the emergency department today with concerns for head injury after a fall. The patient states he was walking around his car when he cut his foot on his current. He then fell. He said he did strike his head however he did not lose consciousness. He is now complaining primarily of right chest wall pain. He denies any shortness of breath with this. He also has a small laceration to his right forehead. He additionally suffered abrasions to his right hand and bilateral knees. He was able to get up and bear weight after he fell.   Past Medical History  Diagnosis Date  . Diabetes mellitus without complication ([place])   . Pacemaker   . Coronary artery disease   . MI (myocardial infarction) ([place])   . Hernia, abdominal     There are no active problems to display for this patient.   History reviewed. No pertinent past surgical history.  No current outpatient prescriptions on file.  Allergies Review of patient's allergies indicates no known allergies.  No family history on file.  Social History Social History  Substance Use Topics  . Smoking status: Never Smoker   . Smokeless tobacco: None  . Alcohol Use: No    Review of Systems  Constitutional: Negative for fever. Cardiovascular: positive for right chest wall pain Respiratory: Negative for shortness of breath. Gastrointestinal: Negative for abdominal pain, vomiting and diarrhea. Genitourinary: Negative for dysuria. Musculoskeletal: Negative for back pain. Skin: Negative for rash.small abrasions and lacerations Neurological: Negative for headaches, focal weakness or numbness.  10-point  ROS otherwise negative.  ____________________________________________   PHYSICAL EXAM:  VITAL SIGNS: ED Triage Vitals  Enc Vitals Group     BP 11/15/15 1521 134/71 mmHg     Pulse Rate 11/15/15 1521 60     Resp 11/15/15 1521 18     Temp 11/15/15 1521 98.1 F (36.7 C)     Temp Source 11/15/15 1521 Oral     SpO2 11/15/15 1521 94 %     Weight 11/15/15 1517 175 lb (79.379 kg)     Height 11/15/15 1517 5\' 8"  (1.727 m)     Head Cir --      Peak Flow --      Pain Score 11/15/15 1517 9   Constitutional: Alert and oriented. Well appearing and in no distress. Eyes: Conjunctivae are normal. PERRL. Normal extraocular movements. ENT   Head: Normocephalic. Small stellate laceration to his right lateral eyebrow. Hemostatic. Small abrasions to his right cheek.   Nose: No congestion/rhinnorhea.   Mouth/Throat: Mucous membranes are moist.   Neck: No stridor. Hematological/Lymphatic/Immunilogical: No cervical lymphadenopathy. Cardiovascular: Normal rate, regular rhythm.  No murmurs, rubs, or gallops. Respiratory: Normal respiratory effort without tachypnea nor retractions. Breath sounds are clear and equal bilaterally. No [email or web address] tenderness to outpatient of the right chest wall. No ecchymosis. Gastrointestinal: Soft and nontender. No distention.  Genitourinary: Deferred Musculoskeletal: Normal range of motion in all extremities. No joint effusions.  No lower extremity tenderness nor edema. Neurologic:  Normal speech and language. No gross focal neurologic deficits are appreciated.  Skin:  Abrasions to right face, right hand and bilateral knees. Psychiatric: Mood and affect are  normal. Speech and behavior are normal. Patient exhibits appropriate insight and judgment.  ____________________________________________    LABS (pertinent positives/negatives)  None ____________________________________________   EKG  I, Nance Pear, attending physician,  personally viewed and interpreted this EKG  EKG Time: 1522 Rate: 61 Rhythm: ventricular paced rhythm Axis: left axis deviation Intervals: qtc 473 QRS: wide, ventricular paced rhythm ST changes: no st elevation equivalent Impression: ventricular paced rhythm ____________________________________________    RADIOLOGY  CT head/cervical spine  IMPRESSION: 1. No evidence of acute intracranial abnormality. No calvarial fracture. 2. Intracranial atherosclerosis and chronic small vessel ischemic white matter change. 3. No cervical spine fracture. 4. Moderate degenerative disc disease in the cervical spine, most prominent at C6-7. Degenerative foraminal stenosis and minimal spondylolisthesis as described.  CXR IMPRESSION: No acute cardiopulmonary disease.  ____________________________________________   PROCEDURES  Procedure(s) performed: None  Critical Care performed: No  ____________________________________________   INITIAL IMPRESSION / ASSESSMENT AND PLAN / ED COURSE  Pertinent labs & imaging results that were available during my care of the patient were reviewed by me and considered in my medical decision making (see chart for details).  Patient presented to the emergency department today after a mechanical fall. The patient hit the right side of his head and chest. Head CT, cervical spine CT and chest x-ray without any acute osseous injury. On exam patient has some abrasions. No abrasions or cuts that would require advanced closure. Will update tetanus. Additionally I think likely patient suffering from right rib contusion. Will have him receive an incentive spirometer.  ____________________________________________   FINAL CLINICAL IMPRESSION(S) / ED DIAGNOSES  Final diagnoses:  Fall, initial encounter  Rib contusion, right, initial encounter  Abrasion     Nance Pear, MD 11/15/15 2001

## 2015-11-15 NOTE — ED Notes (Signed)
Patient to ER after fall. Patient states he tripped over curb. Denies any LOC. Has laceration to right side of head.

## 2015-11-15 NOTE — Discharge Instructions (Signed)
Please seek medical attention for any high fevers, chest pain, shortness of breath, change in behavior, persistent vomiting, bloody stool or any other new or concerning symptoms.   Chest Contusion A chest contusion is a deep bruise on your chest area. Contusions are the result of an injury that caused bleeding under the skin. A chest contusion may involve bruising of the skin, muscles, or ribs. The contusion may turn blue, purple, or yellow. Minor injuries will give you a painless contusion, but more severe contusions may stay painful and swollen for a few weeks. CAUSES  A contusion is usually caused by a blow, trauma, or direct force to an area of the body. SYMPTOMS   Swelling and redness of the injured area.  Discoloration of the injured area.  Tenderness and soreness of the injured area.  Pain. DIAGNOSIS  The diagnosis can be made by taking a history and performing a physical exam. An X-ray, CT scan, or MRI may be needed to determine if there were any associated injuries, such as broken bones (fractures) or internal injuries. TREATMENT  Often, the best treatment for a chest contusion is resting, icing, and applying cold compresses to the injured area. Deep breathing exercises may be recommended to reduce the risk of pneumonia. Over-the-counter medicines may also be recommended for pain control. HOME CARE INSTRUCTIONS   Put ice on the injured area.  Put ice in a plastic bag.  Place a towel between your skin and the bag.  Leave the ice on for 15-20 minutes, 03-04 times a day.  Only take over-the-counter or prescription medicines as directed by your caregiver. Your caregiver may recommend avoiding anti-inflammatory medicines (aspirin, ibuprofen, and naproxen) for 48 hours because these medicines may increase bruising.  Rest the injured area.  Perform deep-breathing exercises as directed by your caregiver.  Stop smoking if you smoke.  Do not lift objects over 5 pounds (2.3 kg)  for 3 days or longer if recommended by your caregiver. SEEK IMMEDIATE MEDICAL CARE IF:   You have increased bruising or swelling.  You have pain that is getting worse.  You have difficulty breathing.  You have dizziness, weakness, or fainting.  You have blood in your urine or stool.  You cough up or vomit blood.  Your swelling or pain is not relieved with medicines. MAKE SURE YOU:   Understand these instructions.  Will watch your condition.  Will get help right away if you are not doing well or get worse.   This information is not intended to replace advice given to you by your health care provider. Make sure you discuss any questions you have with your health care provider.   Document Released: 09/07/2001 Document Revised: 09/06/2012 Document Reviewed: 06/05/2012 Elsevier Interactive Patient Education [employer].

## 2015-11-15 NOTE — ED Notes (Signed)
Pt verbalizes understanding of discharge instructions.

## 2015-11-18 ENCOUNTER — Emergency Department: Payer: Medicare Other

## 2015-11-18 ENCOUNTER — Inpatient Hospital Stay
Admission: EM | Admit: 2015-11-18 | Discharge: 2015-11-24 | DRG: 175 | Disposition: A | Discharge status: Skilled Nursing Facility | Payer: Medicare Other | Attending: Internal Medicine | Admitting: Internal Medicine

## 2015-11-18 ENCOUNTER — Encounter: Payer: Self-pay | Admitting: *Deleted

## 2015-11-18 DIAGNOSIS — I2510 Atherosclerotic heart disease of native coronary artery without angina pectoris: Secondary | ICD-10-CM | POA: Diagnosis present

## 2015-11-18 DIAGNOSIS — E119 Type 2 diabetes mellitus without complications: Secondary | ICD-10-CM | POA: Diagnosis present

## 2015-11-18 DIAGNOSIS — Z79899 Other long term (current) drug therapy: Secondary | ICD-10-CM | POA: Diagnosis not present

## 2015-11-18 DIAGNOSIS — I5022 Chronic systolic (congestive) heart failure: Secondary | ICD-10-CM | POA: Diagnosis present

## 2015-11-18 DIAGNOSIS — I420 Dilated cardiomyopathy: Secondary | ICD-10-CM | POA: Diagnosis present

## 2015-11-18 DIAGNOSIS — Z950 Presence of cardiac pacemaker: Secondary | ICD-10-CM

## 2015-11-18 DIAGNOSIS — I2699 Other pulmonary embolism without acute cor pulmonale: Principal | ICD-10-CM | POA: Diagnosis present

## 2015-11-18 DIAGNOSIS — I252 Old myocardial infarction: Secondary | ICD-10-CM | POA: Diagnosis not present

## 2015-11-18 DIAGNOSIS — I214 Non-ST elevation (NSTEMI) myocardial infarction: Secondary | ICD-10-CM | POA: Diagnosis present

## 2015-11-18 DIAGNOSIS — Z8249 Family history of ischemic heart disease and other diseases of the circulatory system: Secondary | ICD-10-CM | POA: Diagnosis not present

## 2015-11-18 DIAGNOSIS — W19XXXA Unspecified fall, initial encounter: Secondary | ICD-10-CM | POA: Diagnosis present

## 2015-11-18 DIAGNOSIS — I480 Paroxysmal atrial fibrillation: Secondary | ICD-10-CM | POA: Diagnosis present

## 2015-11-18 DIAGNOSIS — I495 Sick sinus syndrome: Secondary | ICD-10-CM | POA: Diagnosis present

## 2015-11-18 DIAGNOSIS — I10 Essential (primary) hypertension: Secondary | ICD-10-CM | POA: Diagnosis present

## 2015-11-18 DIAGNOSIS — I4891 Unspecified atrial fibrillation: Secondary | ICD-10-CM | POA: Diagnosis present

## 2015-11-18 DIAGNOSIS — K219 Gastro-esophageal reflux disease without esophagitis: Secondary | ICD-10-CM | POA: Diagnosis present

## 2015-11-18 HISTORY — DX: Essential (primary) hypertension: I10

## 2015-11-18 LAB — TROPONIN I
TROPONIN I: 0.24 ng/mL — AB (ref <0.031)
Troponin I: 0.2 ng/mL — ABNORMAL HIGH (ref <0.031)

## 2015-11-18 LAB — CBC WITH DIFFERENTIAL/PLATELET
BASOS ABS: 0 10*3/uL (ref 0–0.1)
Basophils Relative: 1 %
Eosinophils Absolute: 0.1 10*3/uL (ref 0–0.7)
Eosinophils Relative: 1 %
HEMATOCRIT: 31 % — AB (ref 40.0–52.0)
HEMOGLOBIN: 10.3 g/dL — AB (ref 13.0–18.0)
LYMPHS PCT: 5 %
Lymphs Abs: 0.4 10*3/uL — ABNORMAL LOW (ref 1.0–3.6)
MCH: 30.4 pg (ref 26.0–34.0)
MCHC: 33.1 g/dL (ref 32.0–36.0)
MCV: 91.7 fL (ref 80.0–100.0)
MONO ABS: 0.6 10*3/uL (ref 0.2–1.0)
MONOS PCT: 7 %
NEUTROS ABS: 7.3 10*3/uL — AB (ref 1.4–6.5)
NEUTROS PCT: 86 %
Platelets: 178 10*3/uL (ref 150–440)
RBC: 3.38 MIL/uL — ABNORMAL LOW (ref 4.40–5.90)
RDW: 17.1 % — AB (ref 11.5–14.5)
WBC: 8.4 10*3/uL (ref 3.8–10.6)

## 2015-11-18 LAB — COMPREHENSIVE METABOLIC PANEL
ALBUMIN: 3.7 g/dL (ref 3.5–5.0)
ALK PHOS: 148 U/L — AB (ref 38–126)
ALT: 21 U/L (ref 17–63)
AST: 28 U/L (ref 15–41)
Anion gap: 10 (ref 5–15)
BILIRUBIN TOTAL: 2.1 mg/dL — AB (ref 0.3–1.2)
BUN: 41 mg/dL — ABNORMAL HIGH (ref 6–20)
CHLORIDE: 94 mmol/L — AB (ref 101–111)
CO2: 25 mmol/L (ref 22–32)
CREATININE: 1.59 mg/dL — AB (ref 0.61–1.24)
Calcium: 8.7 mg/dL — ABNORMAL LOW (ref 8.9–10.3)
GFR calc Af Amer: 45 mL/min — ABNORMAL LOW (ref >60)
GFR, EST NON AFRICAN AMERICAN: 38 mL/min — AB (ref >60)
Glucose, Bld: 299 mg/dL — ABNORMAL HIGH (ref 65–99)
POTASSIUM: 4.4 mmol/L (ref 3.5–5.1)
Sodium: 129 mmol/L — ABNORMAL LOW (ref 135–145)
Total Protein: 7.1 g/dL (ref 6.5–8.1)

## 2015-11-18 LAB — TSH: TSH: 1.866 u[IU]/mL (ref 0.350–4.500)

## 2015-11-18 LAB — APTT: APTT: 37 s — AB (ref 24–36)

## 2015-11-18 LAB — BRAIN NATRIURETIC PEPTIDE: B NATRIURETIC PEPTIDE 5: 1114 pg/mL — AB (ref 0.0–100.0)

## 2015-11-18 LAB — PROTIME-INR
INR: 1.37
Prothrombin Time: 17 seconds — ABNORMAL HIGH (ref 11.4–15.0)

## 2015-11-18 LAB — GLUCOSE, CAPILLARY: GLUCOSE-CAPILLARY: 228 mg/dL — AB (ref 65–99)

## 2015-11-18 MED ORDER — IPRATROPIUM-ALBUTEROL 0.5-2.5 (3) MG/3ML IN SOLN: 3.0000 mL | RESPIRATORY_TRACT | Status: DC | PRN

## 2015-11-18 MED ORDER — PANTOPRAZOLE SODIUM 40 MG PO TBEC
40.0000 mg | DELAYED_RELEASE_TABLET | Freq: Every day | ORAL | Status: DC
Start: 2015-11-19 — End: 2015-11-24
  Administered 2015-11-19 – 2015-11-24 (×6): 40 mg via ORAL
  Filled 2015-11-18 (×6): qty 1

## 2015-11-18 MED ORDER — MORPHINE SULFATE (PF) 4 MG/ML IV SOLN
4.0000 mg | Freq: Once | INTRAVENOUS | Status: AC
  Administered 2015-11-18: 4 mg via INTRAVENOUS
  Filled 2015-11-18: qty 1

## 2015-11-18 MED ORDER — POLYETHYLENE GLYCOL 3350 17 G PO PACK
17.0000 g | PACK | Freq: Every day | ORAL | Status: DC | PRN
  Administered 2015-11-22: 17 g via ORAL
  Filled 2015-11-18: qty 1

## 2015-11-18 MED ORDER — ACETAMINOPHEN 650 MG RE SUPP: 650.0000 mg | Freq: Four times a day (QID) | RECTAL | Status: DC | PRN

## 2015-11-18 MED ORDER — OXYCODONE HCL 5 MG PO TABS
5.0000 mg | ORAL_TABLET | ORAL | Status: DC | PRN
  Administered 2015-11-18 – 2015-11-22 (×10): 5 mg via ORAL
  Filled 2015-11-18 (×10): qty 1

## 2015-11-18 MED ORDER — IOHEXOL 350 MG/ML SOLN
75.0000 mL | Freq: Once | INTRAVENOUS | Status: AC | PRN
  Administered 2015-11-18: 75 mL via INTRAVENOUS

## 2015-11-18 MED ORDER — MORPHINE SULFATE (PF) 2 MG/ML IV SOLN
2.0000 mg | INTRAVENOUS | Status: DC | PRN
  Administered 2015-11-19 (×2): 2 mg via INTRAVENOUS
  Filled 2015-11-18 (×2): qty 1

## 2015-11-18 MED ORDER — ACETAMINOPHEN 325 MG PO TABS
650.0000 mg | ORAL_TABLET | Freq: Four times a day (QID) | ORAL | Status: DC | PRN
  Administered 2015-11-22: 650 mg via ORAL
  Filled 2015-11-18: qty 2

## 2015-11-18 MED ORDER — HEPARIN (PORCINE) IN NACL 100-0.45 UNIT/ML-% IJ SOLN
1200.0000 [IU]/h | INTRAMUSCULAR | Status: DC
  Administered 2015-11-18: 1300 [IU]/h via INTRAVENOUS
  Administered 2015-11-19: 1200 [IU]/h via INTRAVENOUS
  Filled 2015-11-18 (×5): qty 250

## 2015-11-18 MED ORDER — ASPIRIN 325 MG PO TABS
325.0000 mg | ORAL_TABLET | Freq: Once | ORAL | Status: AC
  Administered 2015-11-18: 325 mg via ORAL
  Filled 2015-11-18: qty 1

## 2015-11-18 MED ORDER — ONDANSETRON HCL 4 MG PO TABS: 4.0000 mg | ORAL_TABLET | Freq: Four times a day (QID) | ORAL | Status: DC | PRN

## 2015-11-18 MED ORDER — VITAMIN B-12 1000 MCG PO TABS
1000.0000 ug | ORAL_TABLET | Freq: Every day | ORAL | Status: DC
  Administered 2015-11-19 – 2015-11-24 (×6): 1000 ug via ORAL
  Filled 2015-11-18 (×6): qty 1

## 2015-11-18 MED ORDER — TERAZOSIN HCL 2 MG PO CAPS
2.0000 mg | ORAL_CAPSULE | Freq: Every day | ORAL | Status: DC
  Administered 2015-11-18 – 2015-11-21 (×4): 2 mg via ORAL
  Filled 2015-11-18 (×5): qty 1

## 2015-11-18 MED ORDER — SODIUM CHLORIDE 0.9 % IJ SOLN
3.0000 mL | Freq: Two times a day (BID) | INTRAMUSCULAR | Status: DC
  Administered 2015-11-18 – 2015-11-19 (×3): 3 mL via INTRAVENOUS

## 2015-11-18 MED ORDER — HEPARIN BOLUS VIA INFUSION
4000.0000 [IU] | Freq: Once | INTRAVENOUS | Status: AC
  Administered 2015-11-18: 4000 [IU] via INTRAVENOUS
  Filled 2015-11-18: qty 4000

## 2015-11-18 MED ORDER — FUROSEMIDE 20 MG PO TABS
20.0000 mg | ORAL_TABLET | ORAL | Status: DC
  Administered 2015-11-19 – 2015-11-24 (×3): 20 mg via ORAL
  Filled 2015-11-18 (×3): qty 1

## 2015-11-18 MED ORDER — INSULIN ASPART 100 UNIT/ML ~~LOC~~ SOLN: 0.0000 [IU] | Freq: Three times a day (TID) | SUBCUTANEOUS | Status: DC

## 2015-11-18 MED ORDER — INSULIN ASPART 100 UNIT/ML ~~LOC~~ SOLN
0.0000 [IU] | Freq: Three times a day (TID) | SUBCUTANEOUS | Status: DC
  Administered 2015-11-19 – 2015-11-21 (×4): 2 [IU] via SUBCUTANEOUS
  Administered 2015-11-21 – 2015-11-22 (×2): 3 [IU] via SUBCUTANEOUS
  Administered 2015-11-23 (×2): 2 [IU] via SUBCUTANEOUS
  Administered 2015-11-24: 3 [IU] via SUBCUTANEOUS
  Administered 2015-11-24: 1 [IU] via SUBCUTANEOUS
  Filled 2015-11-18 (×2): qty 2
  Filled 2015-11-18: qty 1
  Filled 2015-11-18: qty 2
  Filled 2015-11-18 (×2): qty 3
  Filled 2015-11-18 (×3): qty 2

## 2015-11-18 MED ORDER — INSULIN ASPART 100 UNIT/ML ~~LOC~~ SOLN
0.0000 [IU] | Freq: Every day | SUBCUTANEOUS | Status: DC
  Administered 2015-11-18: 2 [IU] via SUBCUTANEOUS
  Administered 2015-11-20: 3 [IU] via SUBCUTANEOUS
  Filled 2015-11-18: qty 2
  Filled 2015-11-18: qty 3
  Filled 2015-11-18: qty 2
  Filled 2015-11-18: qty 3

## 2015-11-18 MED ORDER — CHLORTHALIDONE 25 MG PO TABS
12.5000 mg | ORAL_TABLET | Freq: Every day | ORAL | Status: DC
  Administered 2015-11-19 – 2015-11-22 (×4): 12.5 mg via ORAL
  Filled 2015-11-18 (×4): qty 1

## 2015-11-18 MED ORDER — INSULIN ASPART 100 UNIT/ML ~~LOC~~ SOLN: 0.0000 [IU] | Freq: Every day | SUBCUTANEOUS | Status: DC

## 2015-11-18 MED ORDER — TRAMADOL HCL 50 MG PO TABS: 50.0000 mg | ORAL_TABLET | Freq: Four times a day (QID) | ORAL | Status: DC | PRN

## 2015-11-18 MED ORDER — ISOSORBIDE MONONITRATE ER 30 MG PO TB24
30.0000 mg | ORAL_TABLET | Freq: Every day | ORAL | Status: DC
  Administered 2015-11-19 – 2015-11-22 (×4): 30 mg via ORAL
  Filled 2015-11-18 (×4): qty 1

## 2015-11-18 MED ORDER — ONDANSETRON HCL 4 MG/2ML IJ SOLN: 4.0000 mg | Freq: Four times a day (QID) | INTRAMUSCULAR | Status: DC | PRN

## 2015-11-18 MED ORDER — DOCUSATE SODIUM 100 MG PO CAPS
100.0000 mg | ORAL_CAPSULE | Freq: Two times a day (BID) | ORAL | Status: DC
  Administered 2015-11-18 – 2015-11-22 (×8): 100 mg via ORAL
  Filled 2015-11-18 (×8): qty 1

## 2015-11-18 MED ORDER — ZOLPIDEM TARTRATE 5 MG PO TABS
5.0000 mg | ORAL_TABLET | Freq: Every evening | ORAL | Status: DC | PRN
  Administered 2015-11-23: 5 mg via ORAL
  Filled 2015-11-18: qty 1

## 2015-11-18 NOTE — ED Notes (Signed)
Patient transported to CT 

## 2015-11-18 NOTE — ED Notes (Signed)
MD Yao at bedside 

## 2015-11-18 NOTE — ED Notes (Signed)
Pt to ED from home via EMS due to right shoulder pain/chest pain with movement. Pt fell Saturday, seen here, d/c same day. Pt states increased pain since then, full ROM but painful 9/10. Right radial pulses present and strong, vitals wnl at this time. No acute distress noted.

## 2015-11-18 NOTE — Progress Notes (Signed)
Pt admitted into 258. Patient alert and oriented.  Vital signs stable. Skin checked with Jake Samples, RN. Safety precautions implemented, bed alarm on. Assisted patient with bathroom help. Patient oriented to unit. Patient is resting in bed. Sons at bedside. Will continue to monitor.   Iran Sizer M

## 2015-11-18 NOTE — ED Provider Notes (Signed)
CSN: [id number]     Arrival date & time 11/18/15  1610 History   First MD Initiated Contact with Patient 11/18/15 1616     Chief Complaint  Patient presents with  . Shoulder Pain  . Chest Pain     (Consider location/radiation/quality/duration/timing/severity/associated sxs/prior Treatment) The history is provided by the patient.  Donald Henry is a 81 y.o. male history of diabetes, MI, here presenting with persistent right-sided chest pain. Patient states that he fell about 3 days ago. He came to the ER had a unremarkable CT head neck and chest x-ray. Patient was prescribed pain medicines and incentive spirometer but he states that he was in too much pain to use incentive spirometer. States that he has persistent right sided rib pain since the fall. Denies another fall. Denies fever or cough.    Level V caveat- DM   Past Medical History  Diagnosis Date  . Diabetes mellitus without complication ([place])   . Pacemaker   . Coronary artery disease   . MI (myocardial infarction) ([place])   . Hernia, abdominal   . CHF (congestive heart failure) ([place])   . Hypertension    History reviewed. No pertinent past surgical history. History reviewed. No pertinent family history. Social History  Substance Use Topics  . Smoking status: Never Smoker   . Smokeless tobacco: None  . Alcohol Use: No    Review of Systems  Cardiovascular: Positive for chest pain.  All other systems reviewed and are negative.     Allergies  Review of patient's allergies indicates no known allergies.  Home Medications   Prior to Admission medications   Medication Sig Start Date End Date Taking? Authorizing Provider  traMADol (ULTRAM) 50 MG tablet Take 1 tablet (50 mg total) by mouth every 6 (six) hours as needed. 11/15/15 11/14/16  Nance Pear, MD   BP 138/84 mmHg  Pulse 98  Temp(Src) 98 F (36.7 C) (Oral)  Resp 20  Ht 5\' 8"  (1.727 m)  Wt 175 lb (79.379 kg)  BMI 26.61 kg/m2  SpO2 93% Physical  Exam  Constitutional: He is oriented to person, place, and time.  Uncomfortable   HENT:  Head: Normocephalic.  Mouth/Throat: Oropharynx is clear and moist.  Eyes: Conjunctivae are normal. Pupils are equal, round, and reactive to light.  Neck: Normal range of motion. Neck supple.  Cardiovascular: Normal rate, regular rhythm and normal heart sounds.   Pulmonary/Chest: Effort normal and breath sounds normal. No respiratory distress. He has no wheezes. He has no rales.  + reproducible R sided tenderness, no obvious bruising   Abdominal: Soft. Bowel sounds are normal. He exhibits no distension. There is no tenderness. There is no rebound.  Musculoskeletal: Normal range of motion. He exhibits no edema or tenderness.  Neurological: He is alert and oriented to person, place, and time. No cranial nerve deficit. Coordination normal.  Skin: Skin is warm and dry.  Psychiatric: He has a normal mood and affect. His behavior is normal. Judgment and thought content normal.  Nursing note and vitals reviewed.   ED Course  Procedures (including critical care time)  CRITICAL CARE Performed by: Darl Householder, Espyn Radwan   Total critical care time: 30 minutes  Critical care time was exclusive of separately billable procedures and treating other patients.  Critical care was necessary to treat or prevent imminent or life-threatening deterioration.  Critical care was time spent personally by me on the following activities: development of treatment plan with patient and/or surrogate as well as nursing, discussions  with consultants, evaluation of patient's response to treatment, examination of patient, obtaining history from patient or surrogate, ordering and performing treatments and interventions, ordering and review of laboratory studies, ordering and review of radiographic studies, pulse oximetry and re-evaluation of patient's condition.   Labs Review Labs Reviewed  CBC WITH DIFFERENTIAL/PLATELET - Abnormal; Notable  for the following:    RBC 3.38 (*)    Hemoglobin 10.3 (*)    HCT 31.0 (*)    RDW 17.1 (*)    Neutro Abs 7.3 (*)    Lymphs Abs 0.4 (*)    All other components within normal limits  COMPREHENSIVE METABOLIC PANEL - Abnormal; Notable for the following:    Sodium 129 (*)    Chloride 94 (*)    Glucose, Bld 299 (*)    BUN 41 (*)    Creatinine, Ser 1.59 (*)    Calcium 8.7 (*)    Alkaline Phosphatase 148 (*)    Total Bilirubin 2.1 (*)    GFR calc non Af Amer 38 (*)    GFR calc Af Amer 45 (*)    All other components within normal limits  TROPONIN I - Abnormal; Notable for the following:    Troponin I 0.20 (*)    All other components within normal limits  BRAIN NATRIURETIC PEPTIDE - Abnormal; Notable for the following:    B Natriuretic Peptide 1114.0 (*)    All other components within normal limits  APTT  PROTIME-INR    Imaging Review Dg Chest 2 View  11/18/2015  CLINICAL DATA:  Right-sided shoulder and chest pain with movement. Post fall on Saturday. EXAM: CHEST  2 VIEW COMPARISON:  11/15/2015; 04/18/2014 FINDINGS: Grossly unchanged borderline enlarged cardiac silhouette and mediastinal contours given reduced lung volumes. Stable position of support apparatus. Interval development of bilateral medial basilar heterogeneous possible airspace opacities. Interval development trace bilateral effusions. Mild cephalization of flow. No pneumothorax. No definite acute osseous abnormalities. Specifically, no definite displaced right-sided rib fractures. IMPRESSION: 1. Decreased lung volumes with worsening bibasilar heterogeneous potential airspace opacities in development small bilateral effusions constellation of findings worrisome for mild pulmonary edema with associated bibasilar atelectasis though underlying infection not excluded. Continued attention on follow-up is recommended 2. No definite displaced right-sided rib fractures. Electronically Signed   By: Sandi Mariscal M.D.   On: 11/18/2015 17:41    Ct Angio Chest Pe W/cm &/or Wo Cm  11/18/2015  CLINICAL DATA:  Right shoulder in chest pain with movement. Fell Saturday EXAM: CT ANGIOGRAPHY CHEST WITH CONTRAST TECHNIQUE: Multidetector CT imaging of the chest was performed using the standard protocol during bolus administration of intravenous contrast. Multiplanar CT image reconstructions and MIPs were obtained to evaluate the vascular anatomy. CONTRAST:  30mL OMNIPAQUE IOHEXOL 350 MG/ML SOLN COMPARISON:  None. FINDINGS: Right arm IV contrast administration. The SVC is patent. Left subclavian transvenous pacemaker lead extends towards the right ventricular apex. There is mild four-chamber cardiomegaly. RV/LV ratio less than 1. Partially occlusive segmental embolus in apical segment right upper lobe pulmonary artery branch. Occlusive segmental embolus in the right lower lobe pulmonary artery. Partially occlusive thrombus in left upper lobe pulmonary artery. Segmental embolus distally in the lingular branch of the pulmonary artery. Pulmonary veins are patent. Moderate coronary calcifications. Incomplete opacification of the aortic lumen. There is 3 vessel brachiocephalic arterial origin anatomy with calcified plaque in the proximal left subclavian artery. Scattered plaque in the aortic arch and descending thoracic aorta without aneurysm. Moderate right and smaller left pleural effusions. No pericardial  effusion. Subcentimeter prevascular, AP window, pretracheal, and precarinal lymph nodes. No definite hilar adenopathy. Dependent atelectasis/consolidation posteriorly in both lower lobes. Advanced degenerative disc disease C6-7 and T9-10. Sternum intact. Visualized portions of upper abdomen unremarkable. Review of the MIP images confirms the above findings. IMPRESSION: 1. Scattered bilateral central and segmental pulmonary emboli without CT evidence of right heart strain. Critical Value/emergent results were called by telephone at the time of interpretation on  11/18/2015 at 6:47 pm to Dr. Shirlyn Goltz , who verbally acknowledged these results. 2. Bilateral pleural effusions, right greater than left, with atelectasis/consolidation posteriorly in both lower lobes. 3. Atherosclerosis, including aortic and coronary artery disease. Please note that although the presence of coronary artery calcium documents the presence of coronary artery disease, the severity of this disease and any potential stenosis cannot be assessed on this non-gated CT examination. Assessment for potential risk factor modification, dietary therapy or pharmacologic therapy may be warranted, if clinically indicated. Electronically Signed   By: Lucrezia Europe M.D.   On: 11/18/2015 18:49   I have personally reviewed and evaluated these images and lab results as part of my medical decision-making.   EKG Interpretation None        ED ECG REPORT I, Winter Trefz, the attending physician, personally viewed and interpreted this ECG.   Date: 11/18/2015  EKG Time:18:07  Rate: 61  Rhythm: atrial fibrillation, rate 61  Axis: normal  Intervals:left bundle branch block  ST&T Change: nonspecific   MDM   Final diagnoses:  None    TYMELL Henry is a 81 y.o. male here with rib pain. I think likely chest contusion from previous fall. Vitals stable. Will repeat CXR, get labs, EKG.   6:15 pm  Trop positive 0.2. EKG unchanged. CXR showed possible pulm edema vs pneumonia. WBC nl. Will add BNP. Will get CT to further assess.   7:01 PM CT showed bilateral PEs. BNP elevated. I think elevated trop likely demand ischemia from PE. Also new onset afib. Started on heparin. Hospitalist to admit. Patient's family came and told me that he actually has been short of breath for several weeks that is getting worse.    Wandra Arthurs, MD 11/18/15 [phone]

## 2015-11-18 NOTE — ED Notes (Signed)
EKG done upon pt's arrival, printed and given to MD Darl Householder.

## 2015-11-18 NOTE — ED Notes (Signed)
Heparin unable to be started at this time due to lack of pumps, orderly notified, sending some down to ED momentarily.

## 2015-11-18 NOTE — ED Notes (Signed)
Lab called regarding add on APTT and PT- INR

## 2015-11-18 NOTE — H&P (Signed)
[facility] NAME: Donald Henry    MR#:  [id number]  DATE OF BIRTH:  11/16/1930   DATE OF ADMISSION:  11/18/2015  PRIMARY CARE PHYSICIAN: Madelyn Brunner, MD   REQUESTING/REFERRING PHYSICIAN: yao  CHIEF COMPLAINT:   Chief Complaint  Patient presents with  . Shoulder Pain  . Chest Pain    HISTORY OF PRESENT ILLNESS:  Donald Henry  is a 81 y.o. male with a known history of type 2 diabetes non-insulin-requiring, essential hypertension, coronary artery disease who is presenting with right-sided chest/shoulder pain. The patient suffered a mechanical fall approximately 4 days prior to this presentation he is evaluated [facility] emergency Department at that time discharged with as needed medications for pain. His symptoms have been persistent and worsening so he presented Hospital further workup and evaluation. He now complains of pleuritic chest pain.Of note on further questioning he's actually been complaining of a dry nonproductive cough for a total of 1 month duration with associated dyspnea on exertion. He denies fevers chills or further chest pain symptoms lower extremity edema, extended travel, extended periods of time in activity. Emergency department course workup was revealing of multiple pulmonary emboli as well as elevated troponin subsequent was started on heparin drip. He originally was in atrial fibrillation rate controlled and emergency department  PAST MEDICAL HISTORY:   Past Medical History  Diagnosis Date  . Diabetes mellitus without complication ([place])   . Pacemaker   . Coronary artery disease   . MI (myocardial infarction) ([place])   . Hernia, abdominal   . CHF (congestive heart failure) ([place])   . Hypertension     PAST SURGICAL HISTORY:  History reviewed. No pertinent past surgical history.  SOCIAL HISTORY:   Social History  Substance Use Topics  . Smoking status:  Never Smoker   . Smokeless tobacco: Not on file  . Alcohol Use: No    FAMILY HISTORY:   Family History  Problem Relation Age of Onset  . Hypertension Other     DRUG ALLERGIES:  No Known Allergies  REVIEW OF SYSTEMS:  REVIEW OF SYSTEMS:  CONSTITUTIONAL: Denies fevers, chills, positive fatigue, weakness.  EYES: Denies blurred vision, double vision, or eye pain.  EARS, NOSE, THROAT: Denies tinnitus, ear pain, hearing loss.  RESPIRATORY: Positive cough, shortness of breath, denies wheezing  CARDIOVASCULAR: Denies chest pain, palpitations, edema.  GASTROINTESTINAL: Denies nausea, vomiting, diarrhea, abdominal pain.  GENITOURINARY: Denies dysuria, hematuria.  ENDOCRINE: Denies nocturia or thyroid problems. HEMATOLOGIC AND LYMPHATIC: Denies easy bruising or bleeding.  SKIN: Denies rash or lesions.  MUSCULOSKELETAL: Denies pain in neck, back, knees, hips, or further arthritic symptoms. Positive right-sided shoulder pain NEUROLOGIC: Denies paralysis, paresthesias.  PSYCHIATRIC: Denies anxiety or depressive symptoms. Otherwise full review of systems performed by me is negative.   MEDICATIONS AT HOME:   Prior to Admission medications   Medication Sig Start Date End Date Taking? Authorizing Provider  chlorthalidone (HYGROTON) 25 MG tablet Take 12.5 mg by mouth daily.    Yes Historical Provider, MD  furosemide (LASIX) 20 MG tablet Take 20 mg by mouth 3 (three) times a week.   Yes Historical Provider, MD  glipiZIDE (GLUCOTROL) 10 MG tablet Take 10 mg by mouth 2 (two) times daily before a meal.   Yes Historical Provider, MD  isosorbide mononitrate (IMDUR) 30 MG 24 hr tablet Take 30 mg by mouth daily.   Yes Historical Provider, MD  omeprazole (PRILOSEC) 20  MG capsule Take 20 mg by mouth daily.   Yes Historical Provider, MD  terazosin (HYTRIN) 2 MG capsule Take 2 mg by mouth at bedtime.   Yes Historical Provider, MD  traMADol (ULTRAM) 50 MG tablet Take 1 tablet (50 mg total) by mouth every  6 (six) hours as needed. Patient taking differently: Take 50 mg by mouth every 6 (six) hours as needed for moderate pain.  11/15/15 11/14/16 Yes Nance Pear, MD  vitamin B-12 (CYANOCOBALAMIN) 1000 MCG tablet Take 1,000 mcg by mouth daily.   Yes Historical Provider, MD  zolpidem (AMBIEN) 5 MG tablet Take 5 mg by mouth at bedtime as needed for sleep.   Yes Historical Provider, MD      VITAL SIGNS:  Blood pressure 146/74, pulse 63, temperature 98.4 F (36.9 C), temperature source Oral, resp. rate 19, height 5\' 8"  (1.727 m), weight 175 lb (79.379 kg), SpO2 100 %.  PHYSICAL EXAMINATION:  VITAL SIGNS: Filed Vitals:   11/18/15 2030 11/18/15 2116  BP: 139/95 146/74  Pulse: 62 63  Temp:  98.4 F (36.9 C)  Resp: 19    GENERAL:81 y.o.male currently in no acute distress.  HEAD: Normocephalic, atraumatic.  EYES: Pupils equal, round, reactive to light. Extraocular muscles intact. No scleral icterus.  MOUTH: Moist mucosal membrane. Dentition intact. No abscess noted.  EAR, NOSE, THROAT: Clear without exudates. No external lesions.  NECK: Supple. No thyromegaly. No nodules. No JVD.  PULMONARY: Diminished breath sounds throughout Clear to ascultation, without wheeze rails or rhonci. No use of accessory muscles, poor respiratory effort. Poor air entry bilaterally CHEST: Nontender to palpation.  CARDIOVASCULAR: S1 and S2. Regular rate and rhythm. No murmurs, rubs, or gallops. No edema. Pedal pulses 2+ bilaterally.  GASTROINTESTINAL: Soft, nontender, nondistended. No masses. Positive bowel sounds. No hepatosplenomegaly.  MUSCULOSKELETAL: No swelling, clubbing, or edema. Range of motion full in all extremities.  NEUROLOGIC: Cranial nerves II through XII are intact. No gross focal neurological deficits. Sensation intact. Reflexes intact.  SKIN: No ulceration, lesions, rashes, or cyanosis. Skin warm and dry. Turgor intact.  PSYCHIATRIC: Mood, affect within normal limits. The patient is awake, alert  and oriented x 3. Insight, judgment intact.    LABORATORY PANEL:   CBC  Recent Labs Lab 11/18/15 1618  WBC 8.4  HGB 10.3*  HCT 31.0*  PLT 178   ------------------------------------------------------------------------------------------------------------------  Chemistries   Recent Labs Lab 11/18/15 1618  NA 129*  K 4.4  CL 94*  CO2 25  GLUCOSE 299*  BUN 41*  CREATININE 1.59*  CALCIUM 8.7*  AST 28  ALT 21  ALKPHOS 148*  BILITOT 2.1*   ------------------------------------------------------------------------------------------------------------------  Cardiac Enzymes  Recent Labs Lab 11/18/15 1618  TROPONINI 0.20*   ------------------------------------------------------------------------------------------------------------------  RADIOLOGY:  Dg Chest 2 View  11/18/2015  CLINICAL DATA:  Right-sided shoulder and chest pain with movement. Post fall on Saturday. EXAM: CHEST  2 VIEW COMPARISON:  11/15/2015; 04/18/2014 FINDINGS: Grossly unchanged borderline enlarged cardiac silhouette and mediastinal contours given reduced lung volumes. Stable position of support apparatus. Interval development of bilateral medial basilar heterogeneous possible airspace opacities. Interval development trace bilateral effusions. Mild cephalization of flow. No pneumothorax. No definite acute osseous abnormalities. Specifically, no definite displaced right-sided rib fractures. IMPRESSION: 1. Decreased lung volumes with worsening bibasilar heterogeneous potential airspace opacities in development small bilateral effusions constellation of findings worrisome for mild pulmonary edema with associated bibasilar atelectasis though underlying infection not excluded. Continued attention on follow-up is recommended 2. No definite displaced right-sided rib fractures. Electronically Signed  By: Sandi Mariscal M.D.   On: 11/18/2015 17:41   Ct Angio Chest Pe W/cm &/or Wo Cm  11/18/2015  CLINICAL DATA:  Right  shoulder in chest pain with movement. Fell Saturday EXAM: CT ANGIOGRAPHY CHEST WITH CONTRAST TECHNIQUE: Multidetector CT imaging of the chest was performed using the standard protocol during bolus administration of intravenous contrast. Multiplanar CT image reconstructions and MIPs were obtained to evaluate the vascular anatomy. CONTRAST:  70mL OMNIPAQUE IOHEXOL 350 MG/ML SOLN COMPARISON:  None. FINDINGS: Right arm IV contrast administration. The SVC is patent. Left subclavian transvenous pacemaker lead extends towards the right ventricular apex. There is mild four-chamber cardiomegaly. RV/LV ratio less than 1. Partially occlusive segmental embolus in apical segment right upper lobe pulmonary artery branch. Occlusive segmental embolus in the right lower lobe pulmonary artery. Partially occlusive thrombus in left upper lobe pulmonary artery. Segmental embolus distally in the lingular branch of the pulmonary artery. Pulmonary veins are patent. Moderate coronary calcifications. Incomplete opacification of the aortic lumen. There is 3 vessel brachiocephalic arterial origin anatomy with calcified plaque in the proximal left subclavian artery. Scattered plaque in the aortic arch and descending thoracic aorta without aneurysm. Moderate right and smaller left pleural effusions. No pericardial effusion. Subcentimeter prevascular, AP window, pretracheal, and precarinal lymph nodes. No definite hilar adenopathy. Dependent atelectasis/consolidation posteriorly in both lower lobes. Advanced degenerative disc disease C6-7 and T9-10. Sternum intact. Visualized portions of upper abdomen unremarkable. Review of the MIP images confirms the above findings. IMPRESSION: 1. Scattered bilateral central and segmental pulmonary emboli without CT evidence of right heart strain. Critical Value/emergent results were called by telephone at the time of interpretation on 11/18/2015 at 6:47 pm to Dr. Shirlyn Goltz , who verbally acknowledged these  results. 2. Bilateral pleural effusions, right greater than left, with atelectasis/consolidation posteriorly in both lower lobes. 3. Atherosclerosis, including aortic and coronary artery disease. Please note that although the presence of coronary artery calcium documents the presence of coronary artery disease, the severity of this disease and any potential stenosis cannot be assessed on this non-gated CT examination. Assessment for potential risk factor modification, dietary therapy or pharmacologic therapy may be warranted, if clinically indicated. Electronically Signed   By: Lucrezia Europe M.D.   On: 11/18/2015 18:49    EKG:   Orders placed or performed during the hospital encounter of 11/18/15  . EKG 12-Lead  . EKG 12-Lead  . EKG 12-Lead  . EKG 12-Lead    IMPRESSION AND PLAN:   81 year old Caucasian gentleman history of coronary artery disease who is presenting with chest pain shortness of breath  1 multiple acute pulmonary emboli: Unclear risk factors at this time, heparin drip initiated, had lengthy discussion with family and patient ever general concerns for his mobility issues with the requirement of anticoagulation. We'll get physical therapy for further assessment however in the acute setting will continue with heparin for now, place on telemetry, check echocardiogram as well as transient cardiac enzymes 2. Elevated troponin: Likely secondary to cardiac strain given multiple pulmonary emboli: Regardless already on heparin therapy will trend cardiac enzymes 3. Paroxysmal atrial fibrillation: New onset check TSH check echocardiogram will consult Dr. [name] cardiology as he is to see the patient tomorrow regardless, currently appears normal sinus rhythm rate controlled 4. Type 2 diabetes non-insulin-requiring: Hold oral agents at insulin sliding scale 5. GERD without esophagitis: PPI therapy 6. Venous thromboembolism prophylactic: Therapeutic heparin    All the records are reviewed and  case discussed with ED provider. Management plans  discussed with the patient, family and they are in agreement.  CODE STATUS: Full  TOTAL TIME TAKING CARE OF THIS PATIENT: 45 minutes.    [facility]ower,  Karenann Cai.D on 11/18/2015 at 9:40 PM  Between 7am to 6pm - Pager - [phone]  After 6pm: House Pager: - [place]  Office  [phone]  CC: Primary care physician; Madelyn Brunner, MD

## 2015-11-18 NOTE — Progress Notes (Signed)
ANTICOAGULATION CONSULT NOTE - Initial Consult  Pharmacy Consult for Heparin Drip Indication: pulmonary embolus  No Known Allergies  Patient Measurements: Height: 5\' 8"  (172.7 cm) Weight: 175 lb (79.379 kg) IBW/kg (Calculated) : 68.4 Heparin Dosing Weight: 79.4 kg  Vital Signs: Temp: 98 F (36.7 C) (11/22 1613) Temp Source: Oral (11/22 1613) BP: 145/72 mmHg (11/22 1952) Pulse Rate: 60 (11/22 1952)  Labs:  Recent Labs  11/18/15 1618  HGB 10.3*  HCT 31.0*  PLT 178  APTT 37*  LABPROT 17.0*  INR 1.37  CREATININE 1.59*  TROPONINI 0.20*    Estimated Creatinine Clearance: 34.1 mL/min (by C-G formula based on Cr of 1.59).   Medical History: Past Medical History  Diagnosis Date  . Diabetes mellitus without complication ([place])   . Pacemaker   . Coronary artery disease   . MI (myocardial infarction) ([place])   . Hernia, abdominal   . CHF (congestive heart failure) ([place])   . Hypertension     Medications:  Scheduled:   Infusions:  . heparin 1,300 Units/hr (11/18/15 1949)    Assessment: Pharmacy consulted to initiate and titrate heparin drip in an 81 yo male with CT showing bilateral PEs.    APTT: 37, INR: 1.37, Hgb: 10.3, plts: 178  Goal of Therapy:  Heparin level 0.3-0.7 units/ml Monitor CBC per policy   Plan:  Give [id number] units bolus x 1 Start heparin infusion at 1300 units/hr Check anti-Xa level in 8 hours and daily while on heparin Continue to monitor H&H and platelets   Pharmacy will continue to follow.   Khaliyah Northrop G 11/18/2015,8:03 PM

## 2015-11-19 ENCOUNTER — Inpatient Hospital Stay
Admit: 2015-11-19 | Discharge: 2015-11-19 | Disposition: A | Discharge status: Home / Self Care | Payer: Medicare Other | Attending: Internal Medicine | Admitting: Internal Medicine

## 2015-11-19 ENCOUNTER — Inpatient Hospital Stay: Payer: Medicare Other

## 2015-11-19 LAB — CBC
HCT: 28.7 % — ABNORMAL LOW (ref 40.0–52.0)
Hemoglobin: 9.9 g/dL — ABNORMAL LOW (ref 13.0–18.0)
MCH: 31.5 pg (ref 26.0–34.0)
MCHC: 34.5 g/dL (ref 32.0–36.0)
MCV: 91.2 fL (ref 80.0–100.0)
PLATELETS: 161 10*3/uL (ref 150–440)
RBC: 3.15 MIL/uL — ABNORMAL LOW (ref 4.40–5.90)
RDW: 17.3 % — AB (ref 11.5–14.5)
WBC: 7.5 10*3/uL (ref 3.8–10.6)

## 2015-11-19 LAB — HEPARIN LEVEL (UNFRACTIONATED)
HEPARIN UNFRACTIONATED: 0.71 [IU]/mL — AB (ref 0.30–0.70)
HEPARIN UNFRACTIONATED: 0.42 [IU]/mL (ref 0.30–0.70)
Heparin Unfractionated: 0.5 IU/mL (ref 0.30–0.70)

## 2015-11-19 LAB — TROPONIN I
TROPONIN I: 0.16 ng/mL — AB (ref <0.031)
Troponin I: 0.26 ng/mL — ABNORMAL HIGH (ref <0.031)

## 2015-11-19 LAB — GLUCOSE, CAPILLARY
Glucose-Capillary: 100 mg/dL — ABNORMAL HIGH (ref 65–99)
Glucose-Capillary: 177 mg/dL — ABNORMAL HIGH (ref 65–99)
Glucose-Capillary: 219 mg/dL — ABNORMAL HIGH (ref 65–99)
Glucose-Capillary: 167 mg/dL — ABNORMAL HIGH (ref 65–99)

## 2015-11-19 LAB — IRON AND TIBC
IRON: 23 ug/dL — AB (ref 45–182)
SATURATION RATIOS: 8 % — AB (ref 17.9–39.5)
TIBC: 280 ug/dL (ref 250–450)
UIBC: 257 ug/dL

## 2015-11-19 NOTE — Care Management (Signed)
Made referral to CSW .  Physical therapy made recommendation that patient will benefit from skilled nursing placement.  At present time, this CM would doubt patient will accept this recommendation.   Physical therapy states that patient blaming equipment on his balance and weakness issues.

## 2015-11-19 NOTE — Consult Note (Signed)
[facility] Cardiology  CARDIOLOGY CONSULT NOTE  Patient ID: DAQUANTE CALLIS MRN: [id number] DOB/AGE: [id number] 79 y.o.  Admit date: 11/18/2015 Referring Physician Vianne Bulls Primary Physician Gilford Rile Primary Cardiologist [name] Reason for Consultation atrial fibrillation  HPI: 81 year old gentleman referred for evaluation of atrial fibrillation. She has a history of sick sinus syndrome, chronic atrial fibrillation, status post pacemaker. Patient fell approximately 5 days ago, evaluated [facility] emergency room, discharge home, only to return with pleuritic-type chest pain. CT scan was diagnostic for pulmonary emboli. She currently on heparin drip. Initial labs notable for borderline elevated troponin of 0.26. Approximately 3 months ago, the patient was prescribed Eliquis for stroke prevention, however the prescription was not filled because of cost.  Review of systems complete and found to be negative unless listed above     Past Medical History  Diagnosis Date  . Diabetes mellitus without complication ([place])   . Pacemaker   . Coronary artery disease   . MI (myocardial infarction) ([place])   . Hernia, abdominal   . CHF (congestive heart failure) ([place])   . Hypertension     History reviewed. No pertinent past surgical history.  Prescriptions prior to admission  Medication Sig Dispense Refill Last Dose  . chlorthalidone (HYGROTON) 25 MG tablet Take 12.5 mg by mouth daily.    11/18/2015 at Unknown time  . furosemide (LASIX) 20 MG tablet Take 20 mg by mouth 3 (three) times a week.   Past Week at Unknown time  . glipiZIDE (GLUCOTROL) 10 MG tablet Take 10 mg by mouth 2 (two) times daily before a meal.   11/18/2015 at Unknown time  . isosorbide mononitrate (IMDUR) 30 MG 24 hr tablet Take 30 mg by mouth daily.   11/18/2015 at Unknown time  . omeprazole (PRILOSEC) 20 MG capsule Take 20 mg by mouth daily.   11/18/2015 at Unknown time  . terazosin (HYTRIN) 2 MG capsule Take 2 mg by mouth at bedtime.    11/17/2015 at Unknown time  . traMADol (ULTRAM) 50 MG tablet Take 1 tablet (50 mg total) by mouth every 6 (six) hours as needed. (Patient taking differently: Take 50 mg by mouth every 6 (six) hours as needed for moderate pain. ) 20 tablet 0 11/18/2015 at 0830  . vitamin B-12 (CYANOCOBALAMIN) 1000 MCG tablet Take 1,000 mcg by mouth daily.   11/18/2015 at Unknown time  . zolpidem (AMBIEN) 5 MG tablet Take 5 mg by mouth at bedtime as needed for sleep.   11/17/2015 at Unknown time   Social History   Social History  . Marital Status: Married    Spouse Name: N/A  . Number of Children: N/A  . Years of Education: N/A   Occupational History  . Not on file.   Social History Main Topics  . Smoking status: Never Smoker   . Smokeless tobacco: Not on file  . Alcohol Use: No  . Drug Use: Not on file  . Sexual Activity: Not on file   Other Topics Concern  . Not on file   Social History Narrative    Family History  Problem Relation Age of Onset  . Hypertension Other       Review of systems complete and found to be negative unless listed above      PHYSICAL EXAM  General: Well developed, well nourished, in no acute distress HEENT:  Normocephalic and atramatic Neck:  No JVD.  Lungs: Clear bilaterally to auscultation and percussion. Heart: HRRR . Normal S1 and S2 without gallops or murmurs.  Abdomen: Bowel sounds are positive, abdomen soft and non-tender  Msk:  Back normal, normal gait. Normal strength and tone for age. Extremities: No clubbing, cyanosis or edema.   Neuro: Alert and oriented X 3. Psych:  Good affect, responds appropriately  Labs:   Lab Results  Component Value Date   WBC 7.5 11/19/2015   HGB 9.9* 11/19/2015   HCT 28.7* 11/19/2015   MCV 91.2 11/19/2015   PLT 161 11/19/2015    Recent Labs Lab 11/18/15 1618  NA 129*  K 4.4  CL 94*  CO2 25  BUN 41*  CREATININE 1.59*  CALCIUM 8.7*  PROT 7.1  BILITOT 2.1*  ALKPHOS 148*  ALT 21  AST 28  GLUCOSE 299*    Lab Results  Component Value Date   TROPONINI 0.16* 11/19/2015   No results found for: CHOL No results found for: HDL No results found for: LDLCALC No results found for: TRIG No results found for: CHOLHDL No results found for: LDLDIRECT    Radiology: Dg Chest 2 View  11/18/2015  CLINICAL DATA:  Right-sided shoulder and chest pain with movement. Post fall on Saturday. EXAM: CHEST  2 VIEW COMPARISON:  11/15/2015; 04/18/2014 FINDINGS: Grossly unchanged borderline enlarged cardiac silhouette and mediastinal contours given reduced lung volumes. Stable position of support apparatus. Interval development of bilateral medial basilar heterogeneous possible airspace opacities. Interval development trace bilateral effusions. Mild cephalization of flow. No pneumothorax. No definite acute osseous abnormalities. Specifically, no definite displaced right-sided rib fractures. IMPRESSION: 1. Decreased lung volumes with worsening bibasilar heterogeneous potential airspace opacities in development small bilateral effusions constellation of findings worrisome for mild pulmonary edema with associated bibasilar atelectasis though underlying infection not excluded. Continued attention on follow-up is recommended 2. No definite displaced right-sided rib fractures. Electronically Signed   By: Sandi Mariscal M.D.   On: 11/18/2015 17:41   Dg Chest 2 View  11/15/2015  CLINICAL DATA:  Tripped over parking curb today, hit head and right side of chest. Pt has been SOB for over 6 months, has cardiology appt this week for eval. EXAM: CHEST  2 VIEW COMPARISON:  04/18/2014 FINDINGS: Cardiac silhouette is normal in size and configuration. Aorta is mildly uncoiled. No mediastinal or hilar masses or evidence of adenopathy. Mild reticular opacities noted at the lung bases likely combination scarring and subsegmental atelectasis. Lungs otherwise clear. No pleural effusion or pneumothorax. Left anterior chest wall single lead pacemaker is  stable and well positioned. Bony thorax is demineralized but grossly intact. IMPRESSION: No acute cardiopulmonary disease. Electronically Signed   By: Lajean Manes M.D.   On: 11/15/2015 15:54   Ct Head Wo Contrast  11/15/2015  CLINICAL DATA:  Fall with head injury. Right head laceration. Neck pain . EXAM: CT HEAD WITHOUT CONTRAST CT CERVICAL SPINE WITHOUT CONTRAST TECHNIQUE: Multidetector CT imaging of the head and cervical spine was performed following the standard protocol without intravenous contrast. Multiplanar CT image reconstructions of the cervical spine were also generated. COMPARISON:  None. FINDINGS: CT HEAD FINDINGS No evidence of parenchymal hemorrhage or extra-axial fluid collection. No mass lesion, mass effect, or midline shift. No CT evidence of acute infarction. There is intracranial atherosclerosis. Nonspecific subcortical and periventricular white matter hypodensity, most in keeping with chronic small vessel ischemic change. Cerebral volume is age appropriate. No ventriculomegaly. The visualized paranasal sinuses are essentially clear. The mastoid air cells are unopacified. No evidence of calvarial fracture. CT CERVICAL SPINE FINDINGS No fracture is detected in the cervical spine. No prevertebral soft tissue  swelling. There is mild straightening of the cervical spine, usually due to positioning and/or muscle spasm. Dens is well positioned between the lateral masses of C1. The lateral masses appear well-aligned. There is severe degenerative disc disease at C6-7. Mild-to-moderate degenerative disc disease throughout the remaining cervical levels. Mild-to-moderate bilateral facet arthropathy. Moderate foraminal stenosis on the left at C4-5, on the right at C5-6 and bilaterally at C6-7. There is 2 mm retrolisthesis at C6-7. Visualized mastoid air cells appear clear. No evidence of intra-axial hemorrhage in the visualized brain. No gross cervical canal hematoma. No significant pulmonary nodules at  the visualized lung apices. No cervical adenopathy or other significant neck soft tissue abnormality. IMPRESSION: 1. No evidence of acute intracranial abnormality. No calvarial fracture. 2. Intracranial atherosclerosis and chronic small vessel ischemic white matter change. 3. No cervical spine fracture. 4. Moderate degenerative disc disease in the cervical spine, most prominent at C6-7. Degenerative foraminal stenosis and minimal spondylolisthesis as described. Electronically Signed   By: Ilona Sorrel M.D.   On: 11/15/2015 16:09   Ct Angio Chest Pe W/cm &/or Wo Cm  11/18/2015  CLINICAL DATA:  Right shoulder in chest pain with movement. Fell Saturday EXAM: CT ANGIOGRAPHY CHEST WITH CONTRAST TECHNIQUE: Multidetector CT imaging of the chest was performed using the standard protocol during bolus administration of intravenous contrast. Multiplanar CT image reconstructions and MIPs were obtained to evaluate the vascular anatomy. CONTRAST:  19mL OMNIPAQUE IOHEXOL 350 MG/ML SOLN COMPARISON:  None. FINDINGS: Right arm IV contrast administration. The SVC is patent. Left subclavian transvenous pacemaker lead extends towards the right ventricular apex. There is mild four-chamber cardiomegaly. RV/LV ratio less than 1. Partially occlusive segmental embolus in apical segment right upper lobe pulmonary artery branch. Occlusive segmental embolus in the right lower lobe pulmonary artery. Partially occlusive thrombus in left upper lobe pulmonary artery. Segmental embolus distally in the lingular branch of the pulmonary artery. Pulmonary veins are patent. Moderate coronary calcifications. Incomplete opacification of the aortic lumen. There is 3 vessel brachiocephalic arterial origin anatomy with calcified plaque in the proximal left subclavian artery. Scattered plaque in the aortic arch and descending thoracic aorta without aneurysm. Moderate right and smaller left pleural effusions. No pericardial effusion. Subcentimeter  prevascular, AP window, pretracheal, and precarinal lymph nodes. No definite hilar adenopathy. Dependent atelectasis/consolidation posteriorly in both lower lobes. Advanced degenerative disc disease C6-7 and T9-10. Sternum intact. Visualized portions of upper abdomen unremarkable. Review of the MIP images confirms the above findings. IMPRESSION: 1. Scattered bilateral central and segmental pulmonary emboli without CT evidence of right heart strain. Critical Value/emergent results were called by telephone at the time of interpretation on 11/18/2015 at 6:47 pm to Dr. Shirlyn Goltz , who verbally acknowledged these results. 2. Bilateral pleural effusions, right greater than left, with atelectasis/consolidation posteriorly in both lower lobes. 3. Atherosclerosis, including aortic and coronary artery disease. Please note that although the presence of coronary artery calcium documents the presence of coronary artery disease, the severity of this disease and any potential stenosis cannot be assessed on this non-gated CT examination. Assessment for potential risk factor modification, dietary therapy or pharmacologic therapy may be warranted, if clinically indicated. Electronically Signed   By: Lucrezia Europe M.D.   On: 11/18/2015 18:49   Ct Cervical Spine Wo Contrast  11/15/2015  CLINICAL DATA:  Fall with head injury. Right head laceration. Neck pain . EXAM: CT HEAD WITHOUT CONTRAST CT CERVICAL SPINE WITHOUT CONTRAST TECHNIQUE: Multidetector CT imaging of the head and cervical spine was performed following  the standard protocol without intravenous contrast. Multiplanar CT image reconstructions of the cervical spine were also generated. COMPARISON:  None. FINDINGS: CT HEAD FINDINGS No evidence of parenchymal hemorrhage or extra-axial fluid collection. No mass lesion, mass effect, or midline shift. No CT evidence of acute infarction. There is intracranial atherosclerosis. Nonspecific subcortical and periventricular white matter  hypodensity, most in keeping with chronic small vessel ischemic change. Cerebral volume is age appropriate. No ventriculomegaly. The visualized paranasal sinuses are essentially clear. The mastoid air cells are unopacified. No evidence of calvarial fracture. CT CERVICAL SPINE FINDINGS No fracture is detected in the cervical spine. No prevertebral soft tissue swelling. There is mild straightening of the cervical spine, usually due to positioning and/or muscle spasm. Dens is well positioned between the lateral masses of C1. The lateral masses appear well-aligned. There is severe degenerative disc disease at C6-7. Mild-to-moderate degenerative disc disease throughout the remaining cervical levels. Mild-to-moderate bilateral facet arthropathy. Moderate foraminal stenosis on the left at C4-5, on the right at C5-6 and bilaterally at C6-7. There is 2 mm retrolisthesis at C6-7. Visualized mastoid air cells appear clear. No evidence of intra-axial hemorrhage in the visualized brain. No gross cervical canal hematoma. No significant pulmonary nodules at the visualized lung apices. No cervical adenopathy or other significant neck soft tissue abnormality. IMPRESSION: 1. No evidence of acute intracranial abnormality. No calvarial fracture. 2. Intracranial atherosclerosis and chronic small vessel ischemic white matter change. 3. No cervical spine fracture. 4. Moderate degenerative disc disease in the cervical spine, most prominent at C6-7. Degenerative foraminal stenosis and minimal spondylolisthesis as described. Electronically Signed   By: Ilona Sorrel M.D.   On: 11/15/2015 16:09    EKG: Intermittent ventricular pacing with underlying atrial fibrillation with a controlled rate  ASSESSMENT AND PLAN:   1. Chronic atrial fibrillation, controlled ventricular rate, chads Vasc of 6, would benefit from chronic anticoagulation 2. Borderline elevated troponin, likely secondary to pulmonary emboli 3. Pulmonary  emboli  Recommendations  1. Agree with overall current therapy 2. Continue heparin drip 3. Start warfarin therapy for treatment of pulmonary emboli as well as stroke prevention, unless patient agrees to start novel oral anticoagulant such as Xarelto or Eliquis 4. Review 2-D echocardiogram   Signed: Camiya Vinal MD,PhD, [facility] 11/19/2015, 12:49 PM

## 2015-11-19 NOTE — Progress Notes (Signed)
ANTICOAGULATION CONSULT NOTE - Initial Consult  Pharmacy Consult for Heparin Drip Indication: pulmonary embolus  No Known Allergies  Patient Measurements: Height: 5\' 8"  (172.7 cm) Weight: 166 lb 14.4 oz (75.705 kg) IBW/kg (Calculated) : 68.4 Heparin Dosing Weight: 79.4 kg  Vital Signs: Temp: 97.6 F (36.4 C) (11/23 0511) Temp Source: Oral (11/22 2116) BP: 115/66 mmHg (11/23 0511) Pulse Rate: 64 (11/23 0511)  Labs:  Recent Labs  11/18/15 1618 11/18/15 2155 11/19/15 0357  HGB 10.3*  --  9.9*  HCT 31.0*  --  28.7*  PLT 178  --  161  APTT 37*  --   --   LABPROT 17.0*  --   --   INR 1.37  --   --   HEPARINUNFRC  --   --  0.71*  CREATININE 1.59*  --   --   TROPONINI 0.20* 0.24* 0.26*    Estimated Creatinine Clearance: 34.1 mL/min (by C-G formula based on Cr of 1.59).   Medical History: Past Medical History  Diagnosis Date  . Diabetes mellitus without complication ([place])   . Pacemaker   . Coronary artery disease   . MI (myocardial infarction) ([place])   . Hernia, abdominal   . CHF (congestive heart failure) ([place])   . Hypertension     Medications:  Scheduled:  . chlorthalidone  12.5 mg Oral Daily  . docusate sodium  100 mg Oral BID  . furosemide  20 mg Oral Once per day on Mon Wed Fri  . insulin aspart  0-5 Units Subcutaneous QHS  . insulin aspart  0-5 Units Subcutaneous QHS  . insulin aspart  0-9 Units Subcutaneous TID WC  . insulin aspart  0-9 Units Subcutaneous TID WC  . isosorbide mononitrate  30 mg Oral Daily  . pantoprazole  40 mg Oral QAC breakfast  . sodium chloride  3 mL Intravenous Q12H  . terazosin  2 mg Oral QHS  . vitamin B-12  1,000 mcg Oral Daily   Infusions:  . heparin 1,300 Units/hr (11/18/15 1949)    Assessment: Pharmacy consulted to initiate and titrate heparin drip in an 81 yo male with CT showing bilateral PEs.    APTT: 37, INR: 1.37, Hgb: 10.3, plts: 178  Goal of Therapy:  Heparin level 0.3-0.7 units/ml Monitor CBC per policy    Plan:  Give [id number] units bolus x 1 Start heparin infusion at 1300 units/hr Check anti-Xa level in 8 hours and daily while on heparin Continue to monitor H&H and platelets   Pharmacy will continue to follow.   [id number] heparin level supratherapeutic. Decrease rate to 1200 units/hr and will check level in 8 hours.  Laural Benes, Pharm.D., BCPS Clinical Pharmacist 11/19/2015,5:43 AM

## 2015-11-19 NOTE — Progress Notes (Signed)
[facility] NAME: Donald Henry    MR#:  [id number]  DATE OF BIRTH:  June 12, 1930  SUBJECTIVE: Admitted for chest pain, shoulder pain. Also had lots of fatigue.   CHIEF COMPLAINT:   Chief Complaint  Patient presents with  . Shoulder Pain  . Chest Pain    REVIEW OF SYSTEMS:   ROS CONSTITUTIONAL: No fever complaints of fatigue.Marland Kitchen  EYES: No blurred or double vision.  EARS, NOSE, AND THROAT: No tinnitus or ear pain.  RESPIRATORY: No cough, shortness of breath, wheezing or hemoptysis.  CARDIOVASCULAR: pleuritic chest pain on the right side.  GASTROINTESTINAL: No nausea, vomiting, diarrhea or abdominal pain.  GENITOURINARY: No dysuria, hematuria.  ENDOCRINE: No polyuria, nocturia,  HEMATOLOGY: No anemia, easy bruising or bleeding SKIN: No rash or lesion. MUSCULOSKELETAL: No joint pain or arthritis.   NEUROLOGIC: No tingling, numbness, weakness.  PSYCHIATRY: No anxiety or depression.   DRUG ALLERGIES:  No Known Allergies  VITALS:  Blood pressure 126/64, pulse 62, temperature 98.3 F (36.8 C), temperature source Oral, resp. rate 18, height 5\' 8"  (1.727 m), weight 75.705 kg (166 lb 14.4 oz), SpO2 90 %.  PHYSICAL EXAMINATION:  GENERAL:  81 y.o.-year-old patient lying in the bed with no acute distress.  EYES: Pupils equal, round, reactive to light and accommodation. No scleral icterus. Extraocular muscles intact.  HEENT: Head atraumatic, normocephalic. Oropharynx and nasopharynx clear.  NECK:  Supple, no jugular venous distention. No thyroid enlargement, no tenderness.  LUNGS: Normal breath sounds bilaterally, no wheezing, rales,rhonchi or crepitation. No use of accessory muscles of respiration.  CARDIOVASCULAR: S1, S2 normal. No murmurs, rubs, or gallops.  ABDOMEN: Soft, nontender, nondistended. Bowel sounds present. No organomegaly or mass.  EXTREMITIES: No pedal edema, cyanosis, or clubbing.  NEUROLOGIC: Cranial nerves  II through XII are intact. Muscle strength 5/5 in all extremities. Sensation intact. Gait not checked.  PSYCHIATRIC: The patient is alert and oriented x 3.  SKIN: No obvious rash, lesion, or ulcer.    LABORATORY PANEL:   CBC  Recent Labs Lab 11/19/15 0357  WBC 7.5  HGB 9.9*  HCT 28.7*  PLT 161   ------------------------------------------------------------------------------------------------------------------  Chemistries   Recent Labs Lab 11/18/15 1618  NA 129*  K 4.4  CL 94*  CO2 25  GLUCOSE 299*  BUN 41*  CREATININE 1.59*  CALCIUM 8.7*  AST 28  ALT 21  ALKPHOS 148*  BILITOT 2.1*   ------------------------------------------------------------------------------------------------------------------  Cardiac Enzymes  Recent Labs Lab 11/19/15 1003  TROPONINI 0.16*   ------------------------------------------------------------------------------------------------------------------  RADIOLOGY:  Dg Chest 2 View  11/18/2015  CLINICAL DATA:  Right-sided shoulder and chest pain with movement. Post fall on Saturday. EXAM: CHEST  2 VIEW COMPARISON:  11/15/2015; 04/18/2014 FINDINGS: Grossly unchanged borderline enlarged cardiac silhouette and mediastinal contours given reduced lung volumes. Stable position of support apparatus. Interval development of bilateral medial basilar heterogeneous possible airspace opacities. Interval development trace bilateral effusions. Mild cephalization of flow. No pneumothorax. No definite acute osseous abnormalities. Specifically, no definite displaced right-sided rib fractures. IMPRESSION: 1. Decreased lung volumes with worsening bibasilar heterogeneous potential airspace opacities in development small bilateral effusions constellation of findings worrisome for mild pulmonary edema with associated bibasilar atelectasis though underlying infection not excluded. Continued attention on follow-up is recommended 2. No definite displaced right-sided rib  fractures. Electronically Signed   By: Sandi Mariscal M.D.   On: 11/18/2015 17:41   Ct Angio Chest Pe W/cm &/or Wo Cm  11/18/2015  CLINICAL DATA:  Right shoulder in chest pain with movement. Fell Saturday EXAM: CT ANGIOGRAPHY CHEST WITH CONTRAST TECHNIQUE: Multidetector CT imaging of the chest was performed using the standard protocol during bolus administration of intravenous contrast. Multiplanar CT image reconstructions and MIPs were obtained to evaluate the vascular anatomy. CONTRAST:  14mL OMNIPAQUE IOHEXOL 350 MG/ML SOLN COMPARISON:  None. FINDINGS: Right arm IV contrast administration. The SVC is patent. Left subclavian transvenous pacemaker lead extends towards the right ventricular apex. There is mild four-chamber cardiomegaly. RV/LV ratio less than 1. Partially occlusive segmental embolus in apical segment right upper lobe pulmonary artery branch. Occlusive segmental embolus in the right lower lobe pulmonary artery. Partially occlusive thrombus in left upper lobe pulmonary artery. Segmental embolus distally in the lingular branch of the pulmonary artery. Pulmonary veins are patent. Moderate coronary calcifications. Incomplete opacification of the aortic lumen. There is 3 vessel brachiocephalic arterial origin anatomy with calcified plaque in the proximal left subclavian artery. Scattered plaque in the aortic arch and descending thoracic aorta without aneurysm. Moderate right and smaller left pleural effusions. No pericardial effusion. Subcentimeter prevascular, AP window, pretracheal, and precarinal lymph nodes. No definite hilar adenopathy. Dependent atelectasis/consolidation posteriorly in both lower lobes. Advanced degenerative disc disease C6-7 and T9-10. Sternum intact. Visualized portions of upper abdomen unremarkable. Review of the MIP images confirms the above findings. IMPRESSION: 1. Scattered bilateral central and segmental pulmonary emboli without CT evidence of right heart strain. Critical  Value/emergent results were called by telephone at the time of interpretation on 11/18/2015 at 6:47 pm to Dr. Shirlyn Goltz , who verbally acknowledged these results. 2. Bilateral pleural effusions, right greater than left, with atelectasis/consolidation posteriorly in both lower lobes. 3. Atherosclerosis, including aortic and coronary artery disease. Please note that although the presence of coronary artery calcium documents the presence of coronary artery disease, the severity of this disease and any potential stenosis cannot be assessed on this non-gated CT examination. Assessment for potential risk factor modification, dietary therapy or pharmacologic therapy may be warranted, if clinically indicated. Electronically Signed   By: Lucrezia Europe M.D.   On: 11/18/2015 18:49    EKG:   Orders placed or performed during the hospital encounter of 11/18/15  . EKG 12-Lead  . EKG 12-Lead  . EKG 12-Lead  . EKG 12-Lead    ASSESSMENT AND PLAN:    multiple bilateral pulmonary emboli: Started on heparin drip. Check the ultrasound of the legs for DVT evaluation. Likely due to sedentary lifestyle. Discussed about risk and benefits of anticoagulation 2. non-ST elevation MI likely due to pulmonary emboli: Check echocardiogram. Seen by cardiology, continue Imdur, check echocardiogram.   . #3. Coronary artery disease, pacemaker placement:  #4 diabetes mellitus type 2 controlled continue glipizide.   #History of fall recently: Check physical therapy evaluation.  Fatigue;check iron ,b12 levels.  All the records are reviewed and case discussed with Care Management/Social Workerr. Management plans discussed with the patient, family and they are in agreement.  CODE STATUS: full  TOTAL TIME TAKING CARE OF THIS PATIENT: 35 minutes.   POSSIBLE D/C IN 2-3 DAYS, DEPENDING ON CLINICAL CONDITION.   Epifanio Lesches M.D on 11/19/2015 at 1:05 PM  Between 7am to 6pm - Pager - [phone]  After 6pm go to  [email or web address] - password [email or web address] [facility]  Office  [phone]  CC: Primary care physician; Madelyn Brunner, MD   Note: This dictation was prepared with Dragon dictation along with smaller phrase technology. Any transcriptional errors that result from this  process are unintentional.

## 2015-11-19 NOTE — Progress Notes (Signed)
ANTICOAGULATION CONSULT NOTE - Initial Consult  Pharmacy Consult for Heparin Drip Indication: pulmonary embolus  No Known Allergies  Patient Measurements: Height: 5\' 8"  (172.7 cm) Weight: 166 lb 14.4 oz (75.705 kg) IBW/kg (Calculated) : 68.4 Heparin Dosing Weight: 79.4 kg  Vital Signs: Temp: 98.3 F (36.8 C) (11/23 1129) Temp Source: Oral (11/23 1129) BP: 126/64 mmHg (11/23 1129) Pulse Rate: 62 (11/23 1129)  Labs:  Recent Labs  11/18/15 1618 11/18/15 2155 11/19/15 0357 11/19/15 1003 11/19/15 1345  HGB 10.3*  --  9.9*  --   --   HCT 31.0*  --  28.7*  --   --   PLT 178  --  161  --   --   APTT 37*  --   --   --   --   LABPROT 17.0*  --   --   --   --   INR 1.37  --   --   --   --   HEPARINUNFRC  --   --  0.71*  --  0.50  CREATININE 1.59*  --   --   --   --   TROPONINI 0.20* 0.24* 0.26* 0.16*  --     Estimated Creatinine Clearance: 34.1 mL/min (by C-G formula based on Cr of 1.59).   Medical History: Past Medical History  Diagnosis Date  . Diabetes mellitus without complication ([place])   . Pacemaker   . Coronary artery disease   . MI (myocardial infarction) ([place])   . Hernia, abdominal   . CHF (congestive heart failure) ([place])   . Hypertension     Medications:  Scheduled:  . chlorthalidone  12.5 mg Oral Daily  . docusate sodium  100 mg Oral BID  . furosemide  20 mg Oral Once per day on Mon Wed Fri  . insulin aspart  0-5 Units Subcutaneous QHS  . insulin aspart  0-9 Units Subcutaneous TID WC  . isosorbide mononitrate  30 mg Oral Daily  . pantoprazole  40 mg Oral QAC breakfast  . sodium chloride  3 mL Intravenous Q12H  . terazosin  2 mg Oral QHS  . vitamin B-12  1,000 mcg Oral Daily   Infusions:  . heparin 1,200 Units/hr (11/19/15 1219)    Assessment: Pharmacy consulted to initiate and titrate heparin drip in an 81 yo male with CT showing bilateral PEs.    APTT: 37, INR: 1.37, Hgb: 10.3, plts: 178  Goal of Therapy:  Heparin level 0.3-0.7  units/ml Monitor CBC per policy   Plan:  Give [id number] units bolus x 1 Start heparin infusion at 1300 units/hr Check anti-Xa level in 8 hours and daily while on heparin Continue to monitor H&H and platelets   Pharmacy will continue to follow.   1123 0357 heparin level supratherapeutic. Decrease rate to 1200 units/hr and will check level in 8 hours.  11/23 heparin level @ 1400 is therapeutic at 0.5 units/mL. Will continue current infusion rate of 1200 units/hr and order confirmation heparin level in 8 hours.   Lenis Noon, Pharm.D Clinical Pharmacist 11/19/2015,3:07 PM

## 2015-11-19 NOTE — Evaluation (Signed)
Physical Therapy Evaluation Patient Details Name: Donald Henry MRN: [id number] DOB: 1930/07/26 Today's Date: 11/19/2015   History of Present Illness  81 yo male with onset of a-fib and shoulder/chest pain.  Pt not getting blood thinner due to expense, now with PE's, cleared for DVT's  Clinical Impression  Pt was seen for evaluation after having an episode of PE's and now with fatigue and low O2 sats.  Not using O2 at home and is appropriate for short stay in inpt rehab.    Follow Up Recommendations SNF    Equipment Recommendations  None recommended by PT (await SNF disposition)    Recommendations for Other Services       Precautions / Restrictions Precautions Precautions: Fall (telemetry) Restrictions Weight Bearing Restrictions: No      Mobility  Bed Mobility Overal bed mobility: Needs Assistance Bed Mobility: Supine to Sit;Sit to Supine     Supine to sit: Min assist Sit to supine: Min guard;Min assist      Transfers Overall transfer level: Needs assistance Equipment used: 1 person hand held assist Transfers: Sit to/from Stand Sit to Stand: Mod assist         General transfer comment: Pt needs cues for hand placement and safety, tends to not listen  Ambulation/Gait             General Gait Details: refused due to low energy  Stairs            Wheelchair Mobility    Modified Rankin (Stroke Patients Only)       Balance Overall balance assessment: Needs assistance   Sitting balance-Leahy Scale: Fair     Standing balance support: Bilateral upper extremity supported Standing balance-Leahy Scale: Poor                               Pertinent Vitals/Pain Pain Assessment: No/denies pain    Home Living Family/patient expects to be discharged to:: Private residence Living Arrangements: Alone Available Help at Discharge: Family;Available PRN/intermittently Type of Home: House Home Access: Stairs to enter Entrance  Stairs-Rails: Right;Left;Can reach both Entrance Stairs-Number of Steps: 3 Home Layout: One level;Other (Comment) (one step to living room) Home Equipment: Gilford Rile - 2 wheels;Cane - single point      Prior Function Level of Independence: Independent with assistive device(s)               Hand Dominance        Extremity/Trunk Assessment   Upper Extremity Assessment: Overall WFL for tasks assessed           Lower Extremity Assessment: Generalized weakness      Cervical / Trunk Assessment: Normal  Communication   Communication: No difficulties  Cognition Arousal/Alertness: Awake/alert Behavior During Therapy: Agitated Overall Cognitive Status: Impaired/Different from baseline Area of Impairment: Safety/judgement;Following commands;Memory;Problem solving     Memory: Decreased recall of precautions;Decreased short-term memory Following Commands: Follows one step commands inconsistently Safety/Judgement: Decreased awareness of safety;Decreased awareness of deficits   Problem Solving: Slow processing;Difficulty sequencing;Requires verbal cues General Comments: Pt is unfocused on his own deficits and tends to blame the issues on mechanical things that are not related to the problem    General Comments General comments (skin integrity, edema, etc.): Pt is getting up to use urinal and has fatigued too much to tolerate more.  Has O2 sat 96% at rest then 92% after standing.  Aked to remove O2 and informed him not to  take off and reported to nursing    Exercises        Assessment/Plan    PT Assessment Patient needs continued PT services  PT Diagnosis Generalized weakness   PT Problem List Decreased strength;Decreased range of motion;Decreased activity tolerance;Decreased balance;Decreased coordination;Decreased mobility;Cardiopulmonary status limiting activity;Decreased safety awareness;Decreased knowledge of use of DME;Decreased skin integrity  PT Treatment  Interventions DME instruction;Gait training;Stair training;Functional mobility training;Therapeutic activities;Therapeutic exercise;Balance training;Neuromuscular re-education;Patient/family education   PT Goals (Current goals can be found in the Care Plan section) Acute Rehab PT Goals Patient Stated Goal: go home today PT Goal Formulation: With patient Time For Goal Achievement: 12/03/15 Potential to Achieve Goals: Good    Frequency Min 2X/week   Barriers to discharge Inaccessible home environment;Decreased caregiver support      Co-evaluation               End of Session Equipment Utilized During Treatment: Gait belt;Oxygen Activity Tolerance: Patient limited by fatigue Patient left: in bed;with call bell/phone within reach;with bed alarm set;with nursing/sitter in room Nurse Communication: Mobility status         Time: 1552-1610 PT Time Calculation (min) (ACUTE ONLY): 18 min   Charges:   PT Evaluation $Initial PT Evaluation Tier I: 1 Procedure     PT G CodesRamond Dial 11/28/15, 5:18 PM   Mee Hives, PT MS Acute Rehab Dept. Number: [facility] [phone] and [place] [phone]

## 2015-11-19 NOTE — Care Management (Signed)
Patient is admitted from home with multiple pulmonary emboli.  Patient was seen in the ED last Saturday after sustaining a mechanical fall.  Says he hit his  chest and head. Presented back to ED last pm with pain in his right chest and diagnosed with multiple pulmonary emboli.  Currently on a heparin drip.  Patient has recently had to admit his wife to an assisted living memory care unit.  Throughout CM assessment, patient verbalizing many complaints about healthcare in general.  Says about three months ago his cardiologist gave him a prescription for "a blood thinner" and when he went to pick it up the cost was over 300 dollars.  Says he could not pay for that.   He never started the medication.  The medication was Eliquis.   Says he did inform his cardiologist and was told " well it is low so you could do without it."  He says that he was not given a 30 day coupon and has never taken this medication.  Patient does not have medicare D coverage.  Obtains his medications from the [facility]  His PCP at [facility] is Dr. Wandra Arthurs.  Discussed if he is discharge on Eliquis, would provide a 30 day coupon and could take his maintenance prescription to the [place] to have it written/filled by Dr Wandra Arthurs.

## 2015-11-20 LAB — CBC WITH DIFFERENTIAL/PLATELET
Basophils Absolute: 0.1 10*3/uL (ref 0–0.1)
Basophils Relative: 1 %
EOS PCT: 2 %
Eosinophils Absolute: 0.2 10*3/uL (ref 0–0.7)
HCT: 30.1 % — ABNORMAL LOW (ref 40.0–52.0)
Hemoglobin: 10.1 g/dL — ABNORMAL LOW (ref 13.0–18.0)
Lymphocytes Relative: 9 %
Lymphs Abs: 0.6 10*3/uL — ABNORMAL LOW (ref 1.0–3.6)
MCH: 30.8 pg (ref 26.0–34.0)
MCHC: 33.7 g/dL (ref 32.0–36.0)
MCV: 91.6 fL (ref 80.0–100.0)
MONO ABS: 0.6 10*3/uL (ref 0.2–1.0)
MONOS PCT: 8 %
NEUTROS ABS: 6.1 10*3/uL (ref 1.4–6.5)
NEUTROS PCT: 80 %
PLATELETS: 174 10*3/uL (ref 150–440)
RBC: 3.29 MIL/uL — ABNORMAL LOW (ref 4.40–5.90)
RDW: 17.3 % — AB (ref 11.5–14.5)
WBC: 7.5 10*3/uL (ref 3.8–10.6)

## 2015-11-20 LAB — BASIC METABOLIC PANEL
ANION GAP: 8 (ref 5–15)
BUN: 39 mg/dL — ABNORMAL HIGH (ref 6–20)
CALCIUM: 8.7 mg/dL — AB (ref 8.9–10.3)
CO2: 27 mmol/L (ref 22–32)
Chloride: 96 mmol/L — ABNORMAL LOW (ref 101–111)
Creatinine, Ser: 1.41 mg/dL — ABNORMAL HIGH (ref 0.61–1.24)
GFR calc non Af Amer: 44 mL/min — ABNORMAL LOW (ref >60)
GFR, EST AFRICAN AMERICAN: 52 mL/min — AB (ref >60)
Glucose, Bld: 138 mg/dL — ABNORMAL HIGH (ref 65–99)
Potassium: 4.1 mmol/L (ref 3.5–5.1)
SODIUM: 131 mmol/L — AB (ref 135–145)

## 2015-11-20 LAB — GLUCOSE, CAPILLARY
GLUCOSE-CAPILLARY: 258 mg/dL — AB (ref 65–99)
Glucose-Capillary: 128 mg/dL — ABNORMAL HIGH (ref 65–99)
Glucose-Capillary: 195 mg/dL — ABNORMAL HIGH (ref 65–99)
Glucose-Capillary: 141 mg/dL — ABNORMAL HIGH (ref 65–99)

## 2015-11-20 LAB — VITAMIN D 25 HYDROXY (VIT D DEFICIENCY, FRACTURES): VIT D 25 HYDROXY: 70.4 ng/mL (ref 30.0–100.0)

## 2015-11-20 MED ORDER — APIXABAN 5 MG PO TABS: 5.0000 mg | ORAL_TABLET | Freq: Two times a day (BID) | ORAL | Status: DC

## 2015-11-20 MED ORDER — APIXABAN 5 MG PO TABS
10.0000 mg | ORAL_TABLET | Freq: Two times a day (BID) | ORAL | Status: DC
  Administered 2015-11-20 – 2015-11-24 (×9): 10 mg via ORAL
  Filled 2015-11-20 (×9): qty 2

## 2015-11-20 MED ORDER — SODIUM CHLORIDE 0.9 % IJ SOLN: 3.0000 mL | INTRAMUSCULAR | Status: DC | PRN

## 2015-11-20 NOTE — Progress Notes (Signed)
[facility] [place] NAME: Donald Henry    MR#:  [id number]  [place]:  12/30/1929  SUBJECTIVE: Patient admitted for chest pain found to have pulmonary emboli. Started on heparin drip. He has a right-sided chest pain more with the movement.no  Sob,  no cough, no nausea, no vomiting, no diarrhea.   CHIEF COMPLAINT:   Chief Complaint  Patient presents with  . Shoulder Pain  . Chest Pain    REVIEW OF SYSTEMS:   ROS CONSTITUTIONAL: No fever complaints of fatigue.Marland Kitchen  EYES: No blurred or double vision.  EARS, NOSE, AND THROAT: No tinnitus or ear pain.  RESPIRATORY: No cough, shortness of breath, wheezing or hemoptysis.  CARDIOVASCULAR: pleuritic chest pain on the right side.  GASTROINTESTINAL: No nausea, vomiting, diarrhea or abdominal pain.  GENITOURINARY: No dysuria, hematuria.  ENDOCRINE: No polyuria, nocturia,  HEMATOLOGY: No anemia, easy bruising or bleeding SKIN: No rash or lesion. MUSCULOSKELETAL: No joint pain or arthritis.   NEUROLOGIC: No tingling, numbness, weakness.  PSYCHIATRY: No anxiety or depression.   DRUG ALLERGIES:  No Known Allergies  VITALS:  Blood pressure 132/69, pulse 65, temperature 98.2 F (36.8 C), temperature source Oral, resp. rate 18, height 5\' 8"  (1.727 m), weight 75.705 kg (166 lb 14.4 oz), SpO2 95 %.  PHYSICAL EXAMINATION:  GENERAL:  81 y.o.-year-old patient lying in the bed with no acute distress.  EYES: Pupils equal, round, reactive to light and accommodation. No scleral icterus. Extraocular muscles intact.  HEENT: Head atraumatic, normocephalic. Oropharynx and nasopharynx clear.  NECK:  Supple, no jugular venous distention. No thyroid enlargement, no tenderness.  LUNGS: Normal breath sounds bilaterally, no wheezing, rales,rhonchi or crepitation. No use of accessory muscles of respiration.  CARDIOVASCULAR: S1, S2 normal. No murmurs, rubs, or gallops. chest wall tenderness was on the  right side of the chest ABDOMEN: Soft, nontender, nondistended. Bowel sounds present. No organomegaly or mass.  EXTREMITIES: No pedal edema, cyanosis, or clubbing.  NEUROLOGIC: Cranial nerves II through XII are intact. Muscle strength 5/5 in all extremities. Sensation intact. Gait not checked.  PSYCHIATRIC: The patient is alert and oriented x 3.  SKIN: No obvious rash, lesion, or ulcer.    LABORATORY PANEL:   CBC  Recent Labs Lab 11/20/15 0833  WBC 7.5  HGB 10.1*  HCT 30.1*  PLT 174   ------------------------------------------------------------------------------------------------------------------  Chemistries   Recent Labs Lab 11/18/15 1618 11/20/15 0833  NA 129* 131*  K 4.4 4.1  CL 94* 96*  CO2 25 27  GLUCOSE 299* 138*  BUN 41* 39*  CREATININE 1.59* 1.41*  CALCIUM 8.7* 8.7*  AST 28  --   ALT 21  --   ALKPHOS 148*  --   BILITOT 2.1*  --    ------------------------------------------------------------------------------------------------------------------  Cardiac Enzymes  Recent Labs Lab 11/19/15 1003  TROPONINI 0.16*   ------------------------------------------------------------------------------------------------------------------  RADIOLOGY:  Dg Chest 2 View  11/18/2015  CLINICAL DATA:  Right-sided shoulder and chest pain with movement. Post fall on Saturday. EXAM: CHEST  2 VIEW COMPARISON:  11/15/2015; 04/18/2014 FINDINGS: Grossly unchanged borderline enlarged cardiac silhouette and mediastinal contours given reduced lung volumes. Stable position of support apparatus. Interval development of bilateral medial basilar heterogeneous possible airspace opacities. Interval development trace bilateral effusions. Mild cephalization of flow. No pneumothorax. No definite acute osseous abnormalities. Specifically, no definite displaced right-sided rib fractures. IMPRESSION: 1. Decreased lung volumes with worsening bibasilar heterogeneous potential airspace opacities in  development small bilateral effusions constellation of findings  worrisome for mild pulmonary edema with associated bibasilar atelectasis though underlying infection not excluded. Continued attention on follow-up is recommended 2. No definite displaced right-sided rib fractures. Electronically Signed   By: Sandi Mariscal M.D.   On: 11/18/2015 17:41   Ct Angio Chest Pe W/cm &/or Wo Cm  11/18/2015  CLINICAL DATA:  Right shoulder in chest pain with movement. Fell Saturday EXAM: CT ANGIOGRAPHY CHEST WITH CONTRAST TECHNIQUE: Multidetector CT imaging of the chest was performed using the standard protocol during bolus administration of intravenous contrast. Multiplanar CT image reconstructions and MIPs were obtained to evaluate the vascular anatomy. CONTRAST:  13mL OMNIPAQUE IOHEXOL 350 MG/ML SOLN COMPARISON:  None. FINDINGS: Right arm IV contrast administration. The SVC is patent. Left subclavian transvenous pacemaker lead extends towards the right ventricular apex. There is mild four-chamber cardiomegaly. RV/LV ratio less than 1. Partially occlusive segmental embolus in apical segment right upper lobe pulmonary artery branch. Occlusive segmental embolus in the right lower lobe pulmonary artery. Partially occlusive thrombus in left upper lobe pulmonary artery. Segmental embolus distally in the lingular branch of the pulmonary artery. Pulmonary veins are patent. Moderate coronary calcifications. Incomplete opacification of the aortic lumen. There is 3 vessel brachiocephalic arterial origin anatomy with calcified plaque in the proximal left subclavian artery. Scattered plaque in the aortic arch and descending thoracic aorta without aneurysm. Moderate right and smaller left pleural effusions. No pericardial effusion. Subcentimeter prevascular, AP window, pretracheal, and precarinal lymph nodes. No definite hilar adenopathy. Dependent atelectasis/consolidation posteriorly in both lower lobes. Advanced degenerative disc  disease C6-7 and T9-10. Sternum intact. Visualized portions of upper abdomen unremarkable. Review of the MIP images confirms the above findings. IMPRESSION: 1. Scattered bilateral central and segmental pulmonary emboli without CT evidence of right heart strain. Critical Value/emergent results were called by telephone at the time of interpretation on 11/18/2015 at 6:47 pm to Dr. Shirlyn Goltz , who verbally acknowledged these results. 2. Bilateral pleural effusions, right greater than left, with atelectasis/consolidation posteriorly in both lower lobes. 3. Atherosclerosis, including aortic and coronary artery disease. Please note that although the presence of coronary artery calcium documents the presence of coronary artery disease, the severity of this disease and any potential stenosis cannot be assessed on this non-gated CT examination. Assessment for potential risk factor modification, dietary therapy or pharmacologic therapy may be warranted, if clinically indicated. Electronically Signed   By: Lucrezia Europe M.D.   On: 11/18/2015 18:49   US Venous Img Lower Bilateral  11/19/2015  CLINICAL DATA:  Pulmonary embolism.  Evaluate for DVT. EXAM: BILATERAL LOWER EXTREMITY VENOUS DOPPLER ULTRASOUND TECHNIQUE: Gray-scale sonography with graded compression, as well as color Doppler and duplex ultrasound were performed to evaluate the lower extremity deep venous systems from the level of the common femoral vein and including the common femoral, femoral, profunda femoral, popliteal and calf veins including the posterior tibial, peroneal and gastrocnemius veins when visible. The superficial great saphenous vein was also interrogated. Spectral Doppler was utilized to evaluate flow at rest and with distal augmentation maneuvers in the common femoral, femoral and popliteal veins. COMPARISON:  None. FINDINGS: RIGHT LOWER EXTREMITY Common Femoral Vein: No evidence of thrombus. Normal compressibility, respiratory phasicity and  response to augmentation. Saphenofemoral Junction: No evidence of thrombus. Normal compressibility and flow on color Doppler imaging. Profunda Femoral Vein: No evidence of thrombus. Normal compressibility and flow on color Doppler imaging. Femoral Vein: No evidence of thrombus. Normal compressibility, respiratory phasicity and response to augmentation. Popliteal Vein: No evidence of thrombus. Normal  compressibility, respiratory phasicity and response to augmentation. Calf Veins: No evidence of thrombus. Normal compressibility and flow on color Doppler imaging. LEFT LOWER EXTREMITY Common Femoral Vein: No evidence of thrombus. Normal compressibility, respiratory phasicity and response to augmentation. Saphenofemoral Junction: No evidence of thrombus. Normal compressibility and flow on color Doppler imaging. Profunda Femoral Vein: No evidence of thrombus. Normal compressibility and flow on color Doppler imaging. Femoral Vein: No evidence of thrombus. Normal compressibility, respiratory phasicity and response to augmentation. Popliteal Vein: No evidence of thrombus. Normal compressibility, respiratory phasicity and response to augmentation. Calf Veins: No evidence of thrombus. Normal compressibility and flow on color Doppler imaging. Other Findings:  None. IMPRESSION: No evidence of deep venous thrombosis in the bilateral lower extremities. Electronically Signed   By: Markus Daft M.D.   On: 11/19/2015 15:04    EKG:   Orders placed or performed during the hospital encounter of 11/18/15  . EKG 12-Lead  . EKG 12-Lead  . EKG 12-Lead  . EKG 12-Lead    ASSESSMENT AND PLAN:    multiple bilateral pulmonary emboli: Started on heparin drip. Ultrasound of the legs is negative for DVT. We will switch to heparin to NOAC,(D/w pt).  2. non-ST elevation MI likely due to pulmonary emboli:   Echo showed EF of 20-25% with dilated cardiomyopathy. Fatigue may be due to this as well. . #3. Coronary artery disease,  pacemaker placement:  #4 diabetes mellitus type 2 controlled continue glipizide. Controlled.   #History of fall recently;Physical  therapy recommends rehabilitation placement,. musculo skeletal chest pain on the right side secondary to fall: Continue tramadol./oxycodone  CK D stage  3 ;stable All the records are reviewed and case discussed with Care Management/Social Workerr. Management plans discussed with the patient, family and they are in agreement.  CODE STATUS: full  TOTAL TIME TAKING CARE OF THIS PATIENT: 35 minutes.   POSSIBLE D/C IN 2-3 DAYS, DEPENDING ON CLINICAL CONDITION.   Epifanio Lesches M.D on 11/20/2015 at 11:06 AM  Between 7am to 6pm - Pager - [phone]  After 6pm go to [email or web address] - password [email or web address] [facility]  Office  [phone]  CC: Primary care physician; Madelyn Brunner, MD   Note: This dictation was prepared with Dragon dictation along with smaller phrase technology. Any transcriptional errors that result from this process are unintentional.

## 2015-11-20 NOTE — Progress Notes (Signed)
Pt Pt rested quietly throughout the shift. Continues to have mild discomfort in chest upper right chest area. Heparin gtt continues infusing at 12/hr. VSS afebrile tele Ventricular paced

## 2015-11-20 NOTE — Progress Notes (Signed)
ANTICOAGULATION CONSULT NOTE - Initial Consult  Pharmacy Consult for apixaban dosing Indication: PE  No Known Allergies  Patient Measurements: Height: 5\' 8"  (172.7 cm) Weight: 166 lb 14.4 oz (75.705 kg) IBW/kg (Calculated) : 68.4  Vital Signs: Temp: 98.2 F (36.8 C) (11/24 0440) Temp Source: Oral (11/24 0440) BP: 132/69 mmHg (11/24 0440) Pulse Rate: 65 (11/24 0440)  Labs:  Recent Labs  11/18/15 1618 11/18/15 2155 11/19/15 0357 11/19/15 1003 11/19/15 1345 11/19/15 2241 11/20/15 0833  HGB 10.3*  --  9.9*  --   --   --  10.1*  HCT 31.0*  --  28.7*  --   --   --  30.1*  PLT 178  --  161  --   --   --  174  APTT 37*  --   --   --   --   --   --   LABPROT 17.0*  --   --   --   --   --   --   INR 1.37  --   --   --   --   --   --   HEPARINUNFRC  --   --  0.71*  --  0.50 0.42  --   CREATININE 1.59*  --   --   --   --   --  1.41*  TROPONINI 0.20* 0.24* 0.26* 0.16*  --   --   --     Estimated Creatinine Clearance: 38.4 mL/min (by C-G formula based on Cr of 1.41).  Assessment: Pharmacy consulted to dose apixaban for PE in this 81 year old male currently on heparin drip  Plan:  Will discontinue heparin drip and start apixaban 10mg  PO BID x 7 days followed by apixaban 5mg  PO BID.   RN to stop heparin drip with first dose of apixaban today.  Pharmacy to follow per consult  Rexene Edison, PharmD Clinical Pharmacist  11/20/2015 11:15 AM

## 2015-11-20 NOTE — Progress Notes (Signed)
1 L of oxygen. V paced. Pt reported chest pain and received oxy. Family at the bedside. Up using the urinal. Heparin drip was changed to eliquis. A & O. Takes meds ok. Pt has no further concerns at this time.

## 2015-11-20 NOTE — Progress Notes (Signed)
ANTICOAGULATION CONSULT NOTE - Initial Consult  Pharmacy Consult for Heparin Drip Indication: pulmonary embolus  No Known Allergies  Patient Measurements: Height: 5\' 8"  (172.7 cm) Weight: 166 lb 14.4 oz (75.705 kg) IBW/kg (Calculated) : 68.4 Heparin Dosing Weight: 79.4 kg  Vital Signs: Temp: 98.5 F (36.9 C) (11/23 2031) Temp Source: Oral (11/23 2031) BP: 130/70 mmHg (11/23 2031) Pulse Rate: 61 (11/23 2031)  Labs:  Recent Labs  11/18/15 1618 11/18/15 2155 11/19/15 0357 11/19/15 1003 11/19/15 1345 11/19/15 2241  HGB 10.3*  --  9.9*  --   --   --   HCT 31.0*  --  28.7*  --   --   --   PLT 178  --  161  --   --   --   APTT 37*  --   --   --   --   --   LABPROT 17.0*  --   --   --   --   --   INR 1.37  --   --   --   --   --   HEPARINUNFRC  --   --  0.71*  --  0.50 0.42  CREATININE 1.59*  --   --   --   --   --   TROPONINI 0.20* 0.24* 0.26* 0.16*  --   --     Estimated Creatinine Clearance: 34.1 mL/min (by C-G formula based on Cr of 1.59).   Medical History: Past Medical History  Diagnosis Date  . Diabetes mellitus without complication ([place])   . Pacemaker   . Coronary artery disease   . MI (myocardial infarction) ([place])   . Hernia, abdominal   . CHF (congestive heart failure) ([place])   . Hypertension     Medications:  Scheduled:  . chlorthalidone  12.5 mg Oral Daily  . docusate sodium  100 mg Oral BID  . furosemide  20 mg Oral Once per day on Mon Wed Fri  . insulin aspart  0-5 Units Subcutaneous QHS  . insulin aspart  0-9 Units Subcutaneous TID WC  . isosorbide mononitrate  30 mg Oral Daily  . pantoprazole  40 mg Oral QAC breakfast  . sodium chloride  3 mL Intravenous Q12H  . terazosin  2 mg Oral QHS  . vitamin B-12  1,000 mcg Oral Daily   Infusions:  . heparin 1,200 Units/hr (11/19/15 1219)    Assessment: Pharmacy consulted to initiate and titrate heparin drip in an 81 yo male with CT showing bilateral PEs.    APTT: 37, INR: 1.37, Hgb: 10.3, plts:  178  Goal of Therapy:  Heparin level 0.3-0.7 units/ml Monitor CBC per policy   Plan:  Give [id number] units bolus x 1 Start heparin infusion at 1300 units/hr Check anti-Xa level in 8 hours and daily while on heparin Continue to monitor H&H and platelets   Pharmacy will continue to follow.   1123 0357 heparin level supratherapeutic. Decrease rate to 1200 units/hr and will check level in 8 hours.  11/23 heparin level @ 1400 is therapeutic at 0.5 units/mL. Will continue current infusion rate of 1200 units/hr and order confirmation heparin level in 8 hours.   1123 2241 heparin level therapeutic. Continue current rate and will switch to daily monitoring.  Laural Benes, Pharm.D., BCPS Clinical Pharmacist 11/20/2015,12:28 AM

## 2015-11-21 LAB — GLUCOSE, CAPILLARY
GLUCOSE-CAPILLARY: 115 mg/dL — AB (ref 65–99)
GLUCOSE-CAPILLARY: 225 mg/dL — AB (ref 65–99)
GLUCOSE-CAPILLARY: 166 mg/dL — AB (ref 65–99)
Glucose-Capillary: 123 mg/dL — ABNORMAL HIGH (ref 65–99)

## 2015-11-21 LAB — CBC
HEMATOCRIT: 28.9 % — AB (ref 40.0–52.0)
HEMOGLOBIN: 9.9 g/dL — AB (ref 13.0–18.0)
MCH: 30.5 pg (ref 26.0–34.0)
MCHC: 34.1 g/dL (ref 32.0–36.0)
MCV: 89.3 fL (ref 80.0–100.0)
Platelets: 183 10*3/uL (ref 150–440)
RBC: 3.24 MIL/uL — ABNORMAL LOW (ref 4.40–5.90)
RDW: 17 % — ABNORMAL HIGH (ref 11.5–14.5)
WBC: 7 10*3/uL (ref 3.8–10.6)

## 2015-11-21 MED ORDER — SODIUM CHLORIDE 0.9 % IJ SOLN
3.0000 mL | INTRAMUSCULAR | Status: DC | PRN
Start: 2015-11-21 — End: 2015-11-24

## 2015-11-21 MED ORDER — SODIUM CHLORIDE 0.9 % IJ SOLN
3.0000 mL | Freq: Two times a day (BID) | INTRAMUSCULAR | Status: DC
  Administered 2015-11-21 – 2015-11-23 (×5): 3 mL via INTRAVENOUS

## 2015-11-21 MED ORDER — LOSARTAN POTASSIUM 25 MG PO TABS
25.0000 mg | ORAL_TABLET | Freq: Every day | ORAL | Status: DC
  Administered 2015-11-21 – 2015-11-24 (×3): 25 mg via ORAL
  Filled 2015-11-21 (×4): qty 1

## 2015-11-21 MED ORDER — CARVEDILOL 3.125 MG PO TABS
3.1250 mg | ORAL_TABLET | Freq: Two times a day (BID) | ORAL | Status: DC
Start: 2015-11-21 — End: 2015-11-22
  Administered 2015-11-21 – 2015-11-22 (×2): 3.125 mg via ORAL
  Filled 2015-11-21 (×2): qty 1

## 2015-11-21 MED ORDER — ATORVASTATIN CALCIUM 20 MG PO TABS
40.0000 mg | ORAL_TABLET | Freq: Every day | ORAL | Status: DC
  Administered 2015-11-21 – 2015-11-23 (×3): 40 mg via ORAL
  Filled 2015-11-21 (×3): qty 2

## 2015-11-21 NOTE — Care Management Important Message (Signed)
Important Message  Patient Details  Name: Donald Henry MRN: [id number] Date of Birth: [birth year]   Medicare Important Message Given:  Yes    Katrina Stack, RN 11/21/2015, 10:20 AM

## 2015-11-21 NOTE — Progress Notes (Signed)
1 L of oxygen. V Paced. FS are stable. Pt reported pain and received oxy. Up to chair and tolerated it well. Pt has no further concerns at this time.

## 2015-11-21 NOTE — Clinical Social Work Note (Signed)
Clinical Social Work Assessment  Patient Details  Name: Donald Henry MRN: [id number] Date of Birth: 09/17/1930  Date of referral:  11/21/15               Reason for consult:  Facility Placement                Permission sought to share information with:  Facility Contact Representative Permission granted to share information::  Yes, Verbal Permission Granted  Name::        Agency::     Relationship::     Contact Information:     Housing/Transportation Living arrangements for the past 2 months:  Single Family Home Source of Information:  Patient Patient Interpreter Needed:  None Criminal Activity/Legal Involvement Pertinent to Current Situation/Hospitalization:  No - Comment as needed Significant Relationships:  Adult Children Lives with:  Self Do you feel safe going back to the place where you live?  Yes Need for family participation in patient care:  No (Coment)  Care giving concerns:  Patient lives at home alone now that his wife went for rehab and then to reside at [facility] about 2 months ago.   Social Worker assessment / plan:  CSW met with patient regarding PT recommendations for short term rehab. Patient was feeling frustrated due to pain at time of CSW assessment and was eating his breakfast. Patient was upset that he did not get the eggs he requested but did not want CSW to tell anyone because he said he wasn't hungry for them now.   Patient informed CSW that he would be willing to consider short term rehab but under no circumstances did he want to go to [facility]. Patient became angry as he spoke about his concern about his wife's care at [facility]. CSW provided a listening ear and support. Patient stated he had heard good things about [facility] and would consider that facility. Patient is in agreement for a bedsearch to be initiated.  Employment status:  Retired Insurance information:  Medicare PT Recommendations:  Skilled Nursing Facility Information  / Referral to community resources:  Skilled Nursing Facility  Patient/Family's Response to care:  Patient willing to accept recommendations for rehab.  Patient/Family's Understanding of and Emotional Response to Diagnosis, Current Treatment, and Prognosis:  Patient expressed appreciation for csw assistance.   Emotional Assessment Appearance:  Appears stated age Attitude/Demeanor/Rapport:   (frustrated due to pain but cooperative) Affect (typically observed):  Accepting, Appropriate Orientation:  Oriented to Self, Oriented to Place, Oriented to  Time, Oriented to Situation Alcohol / Substance use:  Not Applicable Psych involvement (Current and /or in the community):  No (Comment)  Discharge Needs  Concerns to be addressed:  Care Coordination Readmission within the last 30 days:  No Current discharge risk:  None Barriers to Discharge:  No Barriers Identified   Monica Marra, LCSW 11/21/2015, 9:32 AM  

## 2015-11-21 NOTE — NC FL2 (Signed)
[place] LEVEL OF CARE SCREENING TOOL     IDENTIFICATION  Patient Name: Donald Henry Birthdate: Mar 08, 1930 Sex: male Admission Date (Current Location): 11/18/2015  [place] and [place] Number: Engineering geologist and Address:  [employer], [address] [place],  [address]      Provider Number: [id number]  Attending Physician Name and Address:  Epifanio Lesches, MD  Relative Name and Phone Number:       Current Level of Care: Hospital Recommended Level of Care: [place] Prior Approval Number:    Date Approved/Denied:   PASRR Number: [id number]  Discharge Plan: SNF    Current Diagnoses: Patient Active Problem List   Diagnosis Date Noted  . Acute pulmonary embolism ([place]) 11/18/2015  . Atrial fibrillation ([place]) 11/18/2015    Orientation ACTIVITIES/SOCIAL BLADDER RESPIRATION    Self  Active Continent O2 (As needed)  BEHAVIORAL SYMPTOMS/MOOD NEUROLOGICAL BOWEL NUTRITION STATUS   (none)  (none) Continent Diet  PHYSICIAN VISITS COMMUNICATION OF NEEDS Height & Weight Skin  30 days Verbally   166 lbs. Normal          AMBULATORY STATUS RESPIRATION    Supervision limited O2 (As needed)      Personal Care Assistance Level of Assistance  Bathing, Dressing Bathing Assistance: Limited assistance   Dressing Assistance: Limited assistance      Functional Limitations Info                [place]  PT (By licensed PT)                   Additional Factors Info  Code Status, Allergies Code Status Info: full Allergies Info: nka           Current Medications (11/21/2015): Current Facility-Administered Medications  Medication Dose Route Frequency Provider Last Rate Last Dose  . acetaminophen (TYLENOL) tablet 650 mg  650 mg Oral Q6H PRN Lytle Butte, MD       Or  . acetaminophen (TYLENOL) suppository 650 mg  650 mg Rectal Q6H PRN Lytle Butte, MD      .  apixaban Arne [place]) tablet 10 mg  10 mg Oral BID Epifanio Lesches, MD   10 mg at 11/21/15 0834   Followed by  . [START ON 11/27/2015] apixaban (ELIQUIS) tablet 5 mg  5 mg Oral BID Epifanio Lesches, MD      . chlorthalidone (HYGROTON) tablet 12.5 mg  12.5 mg Oral Daily Lytle Butte, MD   12.5 mg at 11/21/15 [id number]  . docusate sodium (COLACE) capsule 100 mg  100 mg Oral BID Lytle Butte, MD   100 mg at 11/21/15 [id number]  . furosemide (LASIX) tablet 20 mg  20 mg Oral Once per day on Mon Wed Fri Lytle Butte, MD   20 mg at 11/21/15 [id number]  . insulin aspart (novoLOG) injection 0-5 Units  0-5 Units Subcutaneous QHS Lytle Butte, MD   3 Units at 11/20/15 2130  . insulin aspart (novoLOG) injection 0-9 Units  0-9 Units Subcutaneous TID WC Lytle Butte, MD   2 Units at 11/20/15 1205  . ipratropium-albuterol (DUONEB) 0.5-2.5 (3) MG/3ML nebulizer solution 3 mL  3 mL Nebulization Q4H PRN Lytle Butte, MD      . isosorbide mononitrate (IMDUR) 24 hr tablet 30 mg  30 mg Oral Daily Lytle Butte, MD   30 mg at 11/21/15 0834  . morphine 2 MG/ML injection 2 mg  2 mg Intravenous Q4H PRN Lytle Butte, MD   2 mg at 11/19/15 [id number]  . ondansetron (ZOFRAN) tablet 4 mg  4 mg Oral Q6H PRN Lytle Butte, MD       Or  . ondansetron [facility]) injection 4 mg  4 mg Intravenous Q6H PRN Lytle Butte, MD      . oxyCODONE (Oxy IR/ROXICODONE) immediate release tablet 5 mg  5 mg Oral Q4H PRN Lytle Butte, MD   5 mg at 11/21/15 [id number]  . pantoprazole (PROTONIX) EC tablet 40 mg  40 mg Oral QAC breakfast Lytle Butte, MD   40 mg at 11/21/15 [id number]  . polyethylene glycol (MIRALAX / GLYCOLAX) packet 17 g  17 g Oral Daily PRN Lytle Butte, MD      . sodium chloride 0.9 % injection 3 mL  3 mL Intravenous PRN Epifanio Lesches, MD      . terazosin (HYTRIN) capsule 2 mg  2 mg Oral QHS Lytle Butte, MD   2 mg at 11/20/15 2130  . traMADol (ULTRAM) tablet 50 mg  50 mg Oral Q6H PRN Lytle Butte, MD      . vitamin B-12 (CYANOCOBALAMIN) tablet  1,000 mcg  1,000 mcg Oral Daily Lytle Butte, MD   1,000 mcg at 11/21/15 [id number]  . zolpidem (AMBIEN) tablet 5 mg  5 mg Oral QHS PRN Lytle Butte, MD       Do not use this list as official medication orders. Please verify with discharge summary.  Discharge Medications:   Medication List    ASK your doctor about these medications        chlorthalidone 25 MG tablet  Commonly known as:  HYGROTON  Take 12.5 mg by mouth daily.     furosemide 20 MG tablet  Commonly known as:  LASIX  Take 20 mg by mouth 3 (three) times a week.     glipiZIDE 10 MG tablet  Commonly known as:  GLUCOTROL  Take 10 mg by mouth 2 (two) times daily before a meal.     isosorbide mononitrate 30 MG 24 hr tablet  Commonly known as:  IMDUR  Take 30 mg by mouth daily.     omeprazole 20 MG capsule  Commonly known as:  PRILOSEC  Take 20 mg by mouth daily.     terazosin 2 MG capsule  Commonly known as:  HYTRIN  Take 2 mg by mouth at bedtime.     traMADol 50 MG tablet  Commonly known as:  ULTRAM  Take 1 tablet (50 mg total) by mouth every 6 (six) hours as needed.     vitamin B-12 1000 MCG tablet  Commonly known as:  CYANOCOBALAMIN  Take 1,000 mcg by mouth daily.     zolpidem 5 MG tablet  Commonly known as:  AMBIEN  Take 5 mg by mouth at bedtime as needed for sleep.        Relevant Imaging Results:  Relevant Lab Results:  Recent Labs    Additional Information ss: [id number]  Shela Leff, LCSW

## 2015-11-21 NOTE — Progress Notes (Signed)
[facility] [place] NAME: Donald Henry    MR#:  [id number]  [place]:  10-09-1930  SUBJECTIVE: Patient admitted for chest pain found to have pulmonary emboli. Still has some shortness of breath. Sitting in the chair.   CHIEF COMPLAINT:   Chief Complaint  Patient presents with  . Shoulder Pain  . Chest Pain    REVIEW OF SYSTEMS:   Review of Systems  Respiratory: Positive for shortness of breath.    CONSTITUTIONAL: No fever complaints of fatigue.Marland Kitchen  EYES: No blurred or double vision.  EARS, NOSE, AND THROAT: No tinnitus or ear pain.  RESPIRATORY: No cough, shortness of breath, wheezing or hemoptysis.  CARDIOVASCULAR: pleuritic chest pain on the right side.  GASTROINTESTINAL: No nausea, vomiting, diarrhea or abdominal pain.  GENITOURINARY: No dysuria, hematuria.  ENDOCRINE: No polyuria, nocturia,  HEMATOLOGY: No anemia, easy bruising or bleeding SKIN: No rash or lesion. MUSCULOSKELETAL: No joint pain or arthritis.   NEUROLOGIC: No tingling, numbness, weakness.  PSYCHIATRY: No anxiety or depression.   DRUG ALLERGIES:  No Known Allergies  VITALS:  Blood pressure 126/55, pulse 59, temperature 97.8 F (36.6 C), temperature source Oral, resp. rate 18, height 5\' 8"  (1.727 m), weight 75.705 kg (166 lb 14.4 oz), SpO2 99 %.  PHYSICAL EXAMINATION:  GENERAL:  81 y.o.-year-old patient lying in the bed with no acute distress.  EYES: Pupils equal, round, reactive to light and accommodation. No scleral icterus. Extraocular muscles intact.  HEENT: Head atraumatic, normocephalic. Oropharynx and nasopharynx clear.  NECK:  Supple, no jugular venous distention. No thyroid enlargement, no tenderness.  LUNGS: Normal breath sounds bilaterally, no wheezing, rales,rhonchi or crepitation. No use of accessory muscles of respiration.  CARDIOVASCULAR: S1, S2 normal. No murmurs, rubs, or gallops. chest wall tenderness was on the right side of  the chest ABDOMEN: Soft, nontender, nondistended. Bowel sounds present. No organomegaly or mass.  EXTREMITIES: No pedal edema, cyanosis, or clubbing.  NEUROLOGIC: Cranial nerves II through XII are intact. Muscle strength 5/5 in all extremities. Sensation intact. Gait not checked.  PSYCHIATRIC: The patient is alert and oriented x 3.  SKIN: No obvious rash, lesion, or ulcer.    LABORATORY PANEL:   CBC  Recent Labs Lab 11/21/15 0532  WBC 7.0  HGB 9.9*  HCT 28.9*  PLT 183   ------------------------------------------------------------------------------------------------------------------  Chemistries   Recent Labs Lab 11/18/15 1618 11/20/15 0833  NA 129* 131*  K 4.4 4.1  CL 94* 96*  CO2 25 27  GLUCOSE 299* 138*  BUN 41* 39*  CREATININE 1.59* 1.41*  CALCIUM 8.7* 8.7*  AST 28  --   ALT 21  --   ALKPHOS 148*  --   BILITOT 2.1*  --    ------------------------------------------------------------------------------------------------------------------  Cardiac Enzymes  Recent Labs Lab 11/19/15 1003  TROPONINI 0.16*   ------------------------------------------------------------------------------------------------------------------  RADIOLOGY:  US Venous Img Lower Bilateral  11/19/2015  CLINICAL DATA:  Pulmonary embolism.  Evaluate for DVT. EXAM: BILATERAL LOWER EXTREMITY VENOUS DOPPLER ULTRASOUND TECHNIQUE: Gray-scale sonography with graded compression, as well as color Doppler and duplex ultrasound were performed to evaluate the lower extremity deep venous systems from the level of the common femoral vein and including the common femoral, femoral, profunda femoral, popliteal and calf veins including the posterior tibial, peroneal and gastrocnemius veins when visible. The superficial great saphenous vein was also interrogated. Spectral Doppler was utilized to evaluate flow at rest and with distal augmentation maneuvers in the common femoral, femoral and  popliteal veins.  COMPARISON:  None. FINDINGS: RIGHT LOWER EXTREMITY Common Femoral Vein: No evidence of thrombus. Normal compressibility, respiratory phasicity and response to augmentation. Saphenofemoral Junction: No evidence of thrombus. Normal compressibility and flow on color Doppler imaging. Profunda Femoral Vein: No evidence of thrombus. Normal compressibility and flow on color Doppler imaging. Femoral Vein: No evidence of thrombus. Normal compressibility, respiratory phasicity and response to augmentation. Popliteal Vein: No evidence of thrombus. Normal compressibility, respiratory phasicity and response to augmentation. Calf Veins: No evidence of thrombus. Normal compressibility and flow on color Doppler imaging. LEFT LOWER EXTREMITY Common Femoral Vein: No evidence of thrombus. Normal compressibility, respiratory phasicity and response to augmentation. Saphenofemoral Junction: No evidence of thrombus. Normal compressibility and flow on color Doppler imaging. Profunda Femoral Vein: No evidence of thrombus. Normal compressibility and flow on color Doppler imaging. Femoral Vein: No evidence of thrombus. Normal compressibility, respiratory phasicity and response to augmentation. Popliteal Vein: No evidence of thrombus. Normal compressibility, respiratory phasicity and response to augmentation. Calf Veins: No evidence of thrombus. Normal compressibility and flow on color Doppler imaging. Other Findings:  None. IMPRESSION: No evidence of deep venous thrombosis in the bilateral lower extremities. Electronically Signed   By: Markus Daft M.D.   On: 11/19/2015 15:04    EKG:   Orders placed or performed during the hospital encounter of 11/18/15  . EKG 12-Lead  . EKG 12-Lead  . EKG 12-Lead  . EKG 12-Lead    ASSESSMENT AND PLAN:    multiple bilateral pulmonary emboli: Started on Eliquis  yesterday. Discussed the risk and benefits of anticoagulations with him. 2. non-ST elevation MI likely due to pulmonary emboli: cardio is  following  Echo showed EF of 20-25% with dilated cardiomyopathy. Fatigue may be due to this as well. . Multiple beta blocker secondary to bradycardia. Continue when necessary Lasix. Use ARB #3. Coronary artery disease, pacemaker placement:  #4 diabetes mellitus type 2 controlled continue glipizide. Controlled.   #History of fall recently;Physical  therapy recommends rehabilitation placement,. musculo skeletal chest pain on the right side secondary to fall: Continue tramadol./oxycodone  CK D stage  3 ;stable All the records are reviewed and case discussed with Care Management/Social Workerr. Management plans discussed with the patient, family and they are in agreement.  CODE STATUS: full  TOTAL TIME TAKING CARE OF THIS PATIENT: 35 minutes.   POSSIBLE D/C IN 2-3 DAYS, DEPENDING ON CLINICAL CONDITION.   Epifanio Lesches M.D on 11/21/2015 at 2:15 PM  Between 7am to 6pm - Pager - [phone]  After 6pm go to [email or web address] - password [email or web address] [facility]  Office  [phone]  CC: Primary care physician; Madelyn Brunner, MD   Note: This dictation was prepared with Dragon dictation along with smaller phrase technology. Any transcriptional errors that result from this process are unintentional.

## 2015-11-21 NOTE — Care Management (Signed)
Patient is agreeable to skilled nursing placement.  Patient's affect today is much brighter than on 11/23.  He is smiling today and very positive.  Will provide patient with 30 day Eliquis coupon for use when he discharges home.

## 2015-11-21 NOTE — Clinical Social Work Note (Signed)
CSW extended bed offers and patient has chosen [facility]. Doug at [facility] aware and patient states MD stated discharge on Monday. Shela Leff MSW,LCSW [phone]

## 2015-11-21 NOTE — Progress Notes (Signed)
Physical Therapy Treatment Patient Details Name: Donald Henry MRN: [id number] DOB: October 10, 1930 Today's Date: 2015/11/24    History of Present Illness 81 yo male with onset of a-fib and shoulder/chest pain.  Pt not getting blood thinner due to expense, now with PE's, cleared for DVT's    PT Comments    Pt is making good progress towards goals with increased functional mobility noted this date with RW. All mobility performed on RA with sats at 93%, therefore left off of O2. RN notified. Safe technique with rw.  Follow Up Recommendations  Home health PT     Equipment Recommendations  None recommended by PT    Recommendations for Other Services       Precautions / Restrictions Precautions Precautions: Fall Restrictions Weight Bearing Restrictions: No    Mobility  Bed Mobility               General bed mobility comments: not performed as pt in recliner upon arrival  Transfers Overall transfer level: Needs assistance Equipment used: Rolling walker (2 wheeled) Transfers: Sit to/from Stand Sit to Stand: Supervision         General transfer comment: sit<>stand with rw and safe technique. Once standing, he is independent'  Ambulation/Gait Ambulation/Gait assistance: Min guard Ambulation Distance (Feet): 170 Feet Assistive device: Rolling walker (2 wheeled) Gait Pattern/deviations: Step-through pattern     General Gait Details: ambulated using rw and cga. All mobility performed on RA with O2 sats WNL. Reciprocal gait pattern noted with fluid motion   Stairs            Wheelchair Mobility    Modified Rankin (Stroke Patients Only)       Balance                                    Cognition Arousal/Alertness: Awake/alert Behavior During Therapy: WFL for tasks assessed/performed Overall Cognitive Status: Within Functional Limits for tasks assessed                 General Comments: Very pleasant this date    Exercises       General Comments        Pertinent Vitals/Pain Pain Assessment: No/denies pain    Home Living                      Prior Function            PT Goals (current goals can now be found in the care plan section) Acute Rehab PT Goals Patient Stated Goal: to go home PT Goal Formulation: With patient Time For Goal Achievement: 12/03/15 Potential to Achieve Goals: Good Progress towards PT goals: Progressing toward goals    Frequency  Min 2X/week    PT Plan Current plan remains appropriate    Co-evaluation             End of Session Equipment Utilized During Treatment: Gait belt Activity Tolerance: Patient tolerated treatment well Patient left: in chair;with chair alarm set     Time: [id number] PT Time Calculation (min) (ACUTE ONLY): 23 min  Charges:  $Gait Training: 23-37 mins                    G Codes:      Sandeep Delagarza November 24, 2015, 5:06 PM  Greggory Stallion, PT, DPT [phone]

## 2015-11-22 LAB — GLUCOSE, CAPILLARY
GLUCOSE-CAPILLARY: 186 mg/dL — AB (ref 65–99)
GLUCOSE-CAPILLARY: 204 mg/dL — AB (ref 65–99)
Glucose-Capillary: 137 mg/dL — ABNORMAL HIGH (ref 65–99)
Glucose-Capillary: 173 mg/dL — ABNORMAL HIGH (ref 65–99)

## 2015-11-22 MED ORDER — SODIUM CHLORIDE 0.9 % IV SOLN
Freq: Once | INTRAVENOUS | Status: AC
Start: 2015-11-22 — End: 2015-11-22
  Administered 2015-11-22: 12:00:00 via INTRAVENOUS

## 2015-11-22 MED ORDER — SODIUM CHLORIDE 0.9 % IV BOLUS (SEPSIS)
500.0000 mL | Freq: Once | INTRAVENOUS | Status: AC
  Administered 2015-11-22: 500 mL via INTRAVENOUS

## 2015-11-22 NOTE — Progress Notes (Addendum)
Blood pressure at lunch time vitals was 86/43. Patient states he feels light headed. Face is pale. Patient is still oriented and able to talk to me. Dr. Vianne Bulls notified. 500cc bolus started.

## 2015-11-22 NOTE — Progress Notes (Addendum)
Patient has received two 500cc boluses due to hypotension. Pressure immediately after infusions complete, came up to the 90's/40's. Current pressure is now 101/58. Patient states he still does not feel back to normal, but he feels much better than he did. Patient's color on his face has come back some. Lungs sounds have slight crackles in the bases, but this is nothing new. No concern for fluid overload at this time.

## 2015-11-22 NOTE — Progress Notes (Signed)
[facility] [place] NAME: Gracen Slott    MR#:  [id number]  DATE OF BIRTH:  1930/12/16  SUBJECTIVE: seen today.hypotensive today.feels light headed,dizzy.no chest pain. no SOB, no pedal edema..   CHIEF COMPLAINT:   Chief Complaint  Patient presents with  . Shoulder Pain  . Chest Pain    REVIEW OF SYSTEMS:   Review of Systems  Constitutional: Negative for fever.  Respiratory: Negative for shortness of breath.   Neurological: Positive for dizziness and weakness.   CONSTITUTIONAL: No fever complaints of fatigue.Marland Kitchen  EYES: No blurred or double vision.  EARS, NOSE, AND THROAT: No tinnitus or ear pain.  RESPIRATORY: No cough, shortness of breath, wheezing or hemoptysis.  CARDIOVASCULAR: pleuritic chest pain on the right side.  GASTROINTESTINAL: No nausea, vomiting, diarrhea or abdominal pain.  GENITOURINARY: No dysuria, hematuria.  ENDOCRINE: No polyuria, nocturia,  HEMATOLOGY: No anemia, easy bruising or bleeding SKIN: No rash or lesion. MUSCULOSKELETAL: No joint pain or arthritis.   NEUROLOGIC: No tingling, numbness, weakness.  PSYCHIATRY: No anxiety or depression.   DRUG ALLERGIES:  No Known Allergies  VITALS:  Blood pressure 86/45, pulse 60, temperature 98.3 F (36.8 C), temperature source Oral, resp. rate 20, height 5\' 8"  (1.727 m), weight 75.705 kg (166 lb 14.4 oz), SpO2 96 %.  PHYSICAL EXAMINATION:  GENERAL:  81 y.o.-year-old patient lying in the bed with no acute distress.  EYES: Pupils equal, round, reactive to light and accommodation. No scleral icterus. Extraocular muscles intact.  HEENT: Head atraumatic, normocephalic. Oropharynx and nasopharynx clear.  NECK:  Supple, no jugular venous distention. No thyroid enlargement, no tenderness.  LUNGS: Normal breath sounds bilaterally, no wheezing, rales,rhonchi or crepitation. No use of accessory muscles of respiration.  CARDIOVASCULAR: S1, S2 normal. No murmurs,  rubs, or gallops. chest wall tenderness was on the right side of the chest ABDOMEN: Soft, nontender, nondistended. Bowel sounds present. No organomegaly or mass.  EXTREMITIES: No pedal edema, cyanosis, or clubbing.  NEUROLOGIC: Cranial nerves II through XII are intact. Muscle strength 5/5 in all extremities. Sensation intact. Gait not checked.  PSYCHIATRIC: The patient is alert and oriented x 3.  SKIN: No obvious rash, lesion, or ulcer.    LABORATORY PANEL:   CBC  Recent Labs Lab 11/21/15 0532  WBC 7.0  HGB 9.9*  HCT 28.9*  PLT 183   ------------------------------------------------------------------------------------------------------------------  Chemistries   Recent Labs Lab 11/18/15 1618 11/20/15 0833  NA 129* 131*  K 4.4 4.1  CL 94* 96*  CO2 25 27  GLUCOSE 299* 138*  BUN 41* 39*  CREATININE 1.59* 1.41*  CALCIUM 8.7* 8.7*  AST 28  --   ALT 21  --   ALKPHOS 148*  --   BILITOT 2.1*  --    ------------------------------------------------------------------------------------------------------------------  Cardiac Enzymes  Recent Labs Lab 11/19/15 1003  TROPONINI 0.16*   ------------------------------------------------------------------------------------------------------------------  RADIOLOGY:  No results found.  EKG:   Orders placed or performed during the hospital encounter of 11/18/15  . EKG 12-Lead  . EKG 12-Lead  . EKG 12-Lead  . EKG 12-Lead    ASSESSMENT AND PLAN:    multiple bilateral pulmonary emboli: Started on Eliquis  Discussed the risk and benefits of anticoagulations with him. 2. non-ST elevation MI likely due to pulmonary emboli: cardio is following  Echo showed EF of 20-25% with dilated cardiomyopathy. Fatigue may be due to this as well. . Multiple beta blocker secondary to bradycardia. Continue when necessary Lasix.   Hypotension  secondary to multiple bp medications. Started on ARB yesterday ,today morning medications including  Coreg, chlorthalidone, Imdur, losartan. Blood pressure became low. So I have cut down on the BP medicine for now, started on normal saline bolus. We will give 1 L. And monitored on telemetry.   #3. Coronary artery disease, pacemaker placement:  #4 diabetes mellitus type 2 controlled continue glipizide. Controlled.   #History of fall recently;Physical  therapy recommends rehabilitation placement, family  Chose [facility] . musculo skeletal chest pain on the right side secondary to fall: Continue tramadol./oxycodone  CK D stage  3 ;stable  D/w son/  All the records are reviewed and case discussed with Care Management/Social Workerr. Management plans discussed with the patient, family and they are in agreement.  CODE STATUS: full  TOTAL TIME TAKING CARE OF THIS PATIENT: 35 minutes.   POSSIBLE D/C IN 2-3 DAYS, DEPENDING ON CLINICAL CONDITION.   Epifanio Lesches M.D on 11/22/2015 at 12:18 PM  Between 7am to 6pm - Pager - [phone]  After 6pm go to [email or web address] - password [email or web address] [facility]  Office  [phone]  CC: Primary care physician; Madelyn Brunner, MD   Note: This dictation was prepared with Dragon dictation along with smaller phrase technology. Any transcriptional errors that result from this process are unintentional.

## 2015-11-23 LAB — GLUCOSE, CAPILLARY
Glucose-Capillary: 139 mg/dL — ABNORMAL HIGH (ref 65–99)
Glucose-Capillary: 180 mg/dL — ABNORMAL HIGH (ref 65–99)

## 2015-11-23 NOTE — Progress Notes (Signed)
Patient's BP is 120/66 this morning. BP ran low all day yesterday and was low early this morning. Dr. Vianne Bulls stated okay not to give losartan daily dose and to watch BP throughout today. If pressure becomes elevated, will give.

## 2015-11-23 NOTE — Progress Notes (Signed)
[facility] at [place] NAME: Curly Datu    MR#:  [id number]  DATE OF BIRTH:  1930-03-13  SUBJECTIVE: BP better today. bp  medications are on hold. Says he feels better today. he was feeling very tired with the Coreg that he started recently.   CHIEF COMPLAINT:   Chief Complaint  Patient presents with  . Shoulder Pain  . Chest Pain    REVIEW OF SYSTEMS:   Review of Systems  Constitutional: Negative for fever and chills.  HENT: Negative for hearing loss.   Eyes: Negative for blurred vision, double vision and photophobia.  Respiratory: Negative for cough, hemoptysis and shortness of breath.   Cardiovascular: Negative for palpitations, orthopnea and leg swelling.  Gastrointestinal: Negative for vomiting, abdominal pain and diarrhea.  Genitourinary: Negative for dysuria and urgency.  Musculoskeletal: Negative for myalgias and neck pain.  Skin: Negative for rash.  Neurological: Negative for dizziness, focal weakness, seizures, weakness and headaches.  Psychiatric/Behavioral: Negative for memory loss. The patient does not have insomnia.      DRUG ALLERGIES:  No Known Allergies  VITALS:  Blood pressure 120/65, pulse 60, temperature 98.4 F (36.9 C), temperature source Oral, resp. rate 18, height 5\' 8"  (1.727 m), weight 75.705 kg (166 lb 14.4 oz), SpO2 97 %.  PHYSICAL EXAMINATION:  GENERAL:  81 y.o.-year-old patient lying in the bed with no acute distress.  EYES: Pupils equal, round, reactive to light and accommodation. No scleral icterus. Extraocular muscles intact.  HEENT: Head atraumatic, normocephalic. Oropharynx and nasopharynx clear.  NECK:  Supple, no jugular venous distention. No thyroid enlargement, no tenderness.  LUNGS: Normal breath sounds bilaterally, no wheezing, rales,rhonchi or crepitation. No use of accessory muscles of respiration.  CARDIOVASCULAR: S1, S2 normal. No murmurs, rubs, or gallops. chest wall  tenderness was on the right side of the chest ABDOMEN: Soft, nontender, nondistended. Bowel sounds present. No organomegaly or mass.  EXTREMITIES: No pedal edema, cyanosis, or clubbing.  NEUROLOGIC: Cranial nerves II through XII are intact. Muscle strength 5/5 in all extremities. Sensation intact. Gait not checked.  PSYCHIATRIC: The patient is alert and oriented x 3.  SKIN: No obvious rash, lesion, or ulcer.    LABORATORY PANEL:   CBC  Recent Labs Lab 11/21/15 0532  WBC 7.0  HGB 9.9*  HCT 28.9*  PLT 183   ------------------------------------------------------------------------------------------------------------------  Chemistries   Recent Labs Lab 11/18/15 1618 11/20/15 0833  NA 129* 131*  K 4.4 4.1  CL 94* 96*  CO2 25 27  GLUCOSE 299* 138*  BUN 41* 39*  CREATININE 1.59* 1.41*  CALCIUM 8.7* 8.7*  AST 28  --   ALT 21  --   ALKPHOS 148*  --   BILITOT 2.1*  --    ------------------------------------------------------------------------------------------------------------------  Cardiac Enzymes  Recent Labs Lab 11/19/15 1003  TROPONINI 0.16*   ------------------------------------------------------------------------------------------------------------------  RADIOLOGY:  No results found.  EKG:   Orders placed or performed during the hospital encounter of 11/18/15  . EKG 12-Lead  . EKG 12-Lead  . EKG 12-Lead  . EKG 12-Lead    ASSESSMENT AND PLAN:    multiple bilateral pulmonary emboli: Started on Eliquis  Discussed the risk and benefits of anticoagulations with him. 2. non-ST elevation MI likely due to pulmonary emboli: cardio is following  Echo showed EF of 20-25% with dilated cardiomyopathy. Fatigue may be due to this as well. . Multiple beta blocker secondary to bradycardia. Continue when necessary Lasix.   Hypotension secondary to  multiple bp medications. Improved. BP meds are on hold. He feels better especially off the Coreg. Continue to watch on  telemetry today, probably start losartan tomorrow. Continue to hold BP medicines today evening also. BP is soft. Hypotension improved.  #3. Coronary artery disease, pacemaker placement:  #4 diabetes mellitus type 2 controlled continue glipizide. Controlled.   #History of fall recently;Physical  therapy recommends rehabilitation placement, family  Chose [facility],  likely discharge tomorrow..  musculo skeletal chest pain on the right side secondary to fall: Continue tramadol./oxycodone  CK D stage  3 ;stable  D/w son/  All the records are reviewed and case discussed with Care Management/Social Workerr. Management plans discussed with the patient, family and they are in agreement.  CODE STATUS: full  TOTAL TIME TAKING CARE OF THIS PATIENT: 35 minutes.   POSSIBLE D/C IN 2-3 DAYS, DEPENDING ON CLINICAL CONDITION.   Epifanio Lesches M.D on 11/23/2015 at 12:14 PM  Between 7am to 6pm - Pager - [phone]  After 6pm go to [email or web address] - password [email or web address] [facility]  Office  [phone]  CC: Primary care physician; Madelyn Brunner, MD   Note: This dictation was prepared with Dragon dictation along with smaller phrase technology. Any transcriptional errors that result from this process are unintentional.

## 2015-11-24 LAB — GLUCOSE, CAPILLARY: GLUCOSE-CAPILLARY: 220 mg/dL — AB (ref 65–99)

## 2015-11-24 MED ORDER — LOSARTAN POTASSIUM 25 MG PO TABS: 25.0000 mg | ORAL_TABLET | Freq: Every day | ORAL | Status: DC

## 2015-11-24 MED ORDER — ATORVASTATIN CALCIUM 40 MG PO TABS: 40.0000 mg | ORAL_TABLET | Freq: Every day | ORAL | Status: DC

## 2015-11-24 MED ORDER — APIXABAN 5 MG PO TABS: 10.0000 mg | ORAL_TABLET | Freq: Two times a day (BID) | ORAL | Status: DC

## 2015-11-24 MED ORDER — APIXABAN 5 MG PO TABS: 5.0000 mg | ORAL_TABLET | Freq: Two times a day (BID) | ORAL | Status: DC

## 2015-11-24 NOTE — Care Management Important Message (Signed)
Important Message  Patient Details  Name: Donald Henry MRN: [id number] Date of Birth: 10-31-1930   Medicare Important Message Given:  Yes    [name] 11/24/2015, 9:49 AM

## 2015-11-24 NOTE — Progress Notes (Signed)
Physical Therapy Treatment Patient Details Name: Donald Henry MRN: [id number] DOB: 1930-12-01  Today's Date: 11/24/2015    History of Present Illness 81 yo male with onset of a-fib and shoulder/chest pain.  Pt not getting blood thinner due to expense, now with PE's, cleared for DVT's    PT Comments    Pt complains of weakness, fatigue and R sided chest pain primarily with exertion. Pt participates well with PT with limitation evident. Pt requires cueing for safe transfers. Ambulation demonstrates decreased quality with shortened stride, shuffling gait, decreased base of support and forward flexed trunk. In 90 ft walk heart rate increases 20+ beats per minute with complains of increased right chest pain and O2 saturation decreases to 92% with mild complains of shortness of breath. Pt unable to attempt steps today due to bilateral lower extremity weakness and generalized malaise/fatigue. It is noted that pt has 4 steps to enter his home with bilateral hand rails after a sizeable walk from the driveway. Pt also has 1-2 steps to utilize a couple of rooms in his home that must be performed. Pt unable to tolerate living situation at this time due to decreased ambulation, ability to perform steps and decreased safety. Recommend rehab stay post discharge from hospital to focus on strength, endurance, quality of ambulation, stair climbing and safety to improve functional mobility.      Follow Up Recommendations  SNF     Equipment Recommendations  None recommended by PT    Recommendations for Other Services       Precautions / Restrictions Precautions Precautions: Fall Restrictions Weight Bearing Restrictions: No    Mobility  Bed Mobility Overal bed mobility: Needs Assistance Bed Mobility: Supine to Sit;Sit to Supine     Supine to sit: Min assist Sit to supine: Min guard   General bed mobility comments: Increased time/effort with assist  Transfers Overall transfer level:  Needs assistance Equipment used: Rolling walker (2 wheeled) Transfers: Sit to/from Stand (performed 2x from bed) Sit to Stand: Min guard (Cues for safe hand placment)         General transfer comment: Safety cues required for hands 100% of the time; slow to rise and notes weakness and mildly unsteady feeling on feet  Ambulation/Gait Ambulation/Gait assistance: Min guard Ambulation Distance (Feet): 90 Feet Assistive device: Rolling walker (2 wheeled) Gait Pattern/deviations: Step-through pattern;Decreased stride length;Decreased dorsiflexion - right;Decreased dorsiflexion - left;Shuffle;Trunk flexed;Narrow base of support     General Gait Details: Heart rate increased to 88 bpm and O2 saturation decreased to 92% with 90 foot walk. Subjective SOB and increased R sided chest pain post ambulation.    Stairs Stairs:  (Unable to perform at this time due to weakness BLEs/fatigue)          Wheelchair Mobility    Modified Rankin (Stroke Patients Only)       Balance Overall balance assessment: Needs assistance         Standing balance support: Bilateral upper extremity supported Standing balance-Leahy Scale: Fair                      Cognition Arousal/Alertness: Awake/alert Behavior During Therapy: WFL for tasks assessed/performed Overall Cognitive Status: Within Functional Limits for tasks assessed Area of Impairment: Safety/judgement       Following Commands: Follows one step commands inconsistently            Exercises General Exercises - Lower Extremity Long Arc Quad: AROM;Both;10 reps;Seated Hip Flexion/Marching: AROM;Both;10 reps;Seated Toe Raises:  AROM;Both;20 reps;Seated Heel Raises: Both;20 reps;Seated;AROM    General Comments        Pertinent Vitals/Pain Pain Assessment:  (Complains of R sided chest pain primarily with exertion)    Home Living                      Prior Function            PT Goals (current goals can now  be found in the care plan section) Progress towards PT goals: Progressing toward goals    Frequency  Min 2X/week    PT Plan Discharge plan needs to be updated    Co-evaluation             End of Session Equipment Utilized During Treatment: Gait belt Activity Tolerance: Patient tolerated treatment well;Patient limited by fatigue Patient left: in chair;with call bell/phone within reach;with chair alarm set;with family/visitor present     Time: [id number] PT Time Calculation (min) (ACUTE ONLY): 35 min  Charges:  $Gait Training: 8-22 mins $Therapeutic Exercise: 8-22 mins                    G Codes:      Charlaine Dalton 11/24/2015, 2:08 PM

## 2015-11-24 NOTE — Progress Notes (Signed)
CSW was notified that Pt has been medically cleared for dc to to SNF [facility]. Pt's son is at bedside. CSW, Pt and son discussed long-term needs after SNF. Pt did well with PT on Friday but has not done well over the weekend, CSW requested PT come see Pt and evaluate again for SNF vs HH. PT came and recommended SNF again. CSW confirmed SNF bed at [facility]. Pt will go via EMS. RN to call report for dc. Pt's son appreciative of CSW assistance.   Toma Copier, [place]

## 2015-11-24 NOTE — Clinical Social Work Placement (Signed)
   CLINICAL SOCIAL WORK PLACEMENT  NOTE  Date:  11/24/2015  Patient Details  Name: Donald Henry MRN: [id number] Date of Birth: [birth year]  Clinical Social Work is seeking post-discharge placement for this patient at the [place] level of care (*CSW will initial, date and re-position this form in  chart as items are completed):  Yes   Patient/family provided with [facility] Department's list of facilities offering this level of care within the geographic area requested by the patient (or if unable, by the patient's family).  Yes   Patient/family informed of their freedom to choose among providers that offer the needed level of care, that participate in Medicare, Medicaid or managed care program needed by the patient, have an available bed and are willing to accept the patient.  Yes   Patient/family informed of [facility]'s ownership interest in [facility] and [facility], as well as of the fact that they are under no obligation to receive care at these facilities.  PASRR submitted to EDS on 11/21/15     PASRR number received on 11/21/15     Existing PASRR number confirmed on       FL2 transmitted to all facilities in geographic area requested by pt/family on 11/21/15     FL2 transmitted to all facilities within larger geographic area on       Patient informed that his/her managed care company has contracts with or will negotiate with certain facilities, including the following:        Yes   Patient/family informed of bed offers received.  Patient chooses bed at [facility]     Physician recommends and patient chooses bed at      Patient to be transferred to [facility] on 11/24/15.  Patient to be transferred to facility by EMS     Patient family notified on 11/24/15 of transfer.  Name of family member notified:  Son at bedside     PHYSICIAN Please sign FL2, Please prepare priority  discharge summary, including medications, Please prepare prescriptions     Additional Comment:    _______________________________________________ Alonna Buckler, LCSW 11/24/2015, 2:30 PM

## 2015-11-24 NOTE — Care Management (Signed)
Patient to discharge to [facility] today.  CSW following

## 2015-11-24 NOTE — Discharge Summary (Signed)
Donald Henry, is a 81 y.o. male  DOB May 11, 1930  MRN [id number].  Admission date:  11/18/2015  Admitting Physician  Lytle Butte, MD  Discharge Date:  11/24/2015   Primary MD  Madelyn Brunner, MD  Recommendations for primary care physician for things to follow:  Follow-up with Dr. Micah Noel. [place] in one week   Admission Diagnosis  Other acute pulmonary embolism without acute cor pulmonale (HCC) [I26.99]   Discharge Diagnosis  Other acute pulmonary embolism without acute cor pulmonale (HCC) [I26.99]    Principal Problem:   Atrial fibrillation ([place]) Active Problems:   Acute pulmonary embolism ([place])      Past Medical History  Diagnosis Date  . Diabetes mellitus without complication ([place])   . Pacemaker   . Coronary artery disease   . MI (myocardial infarction) ([place])   . Hernia, abdominal   . CHF (congestive heart failure) ([place])   . Hypertension     History reviewed. No pertinent past surgical history.     History of present illness and  Hospital Course:     Kindly see H&P for history of present illness and admission details, please review complete Labs, Consult reports and Test reports for all details in brief  HPI  from the history and physical done on the day of admission 81 year old male patient with history of diabetes mellitus type 2, hypertension, coronary artery disease comes in because of right-sided chest pain and shoulder pain. Patient had a mechanical fall 4 days ago. Patient was seen in the emergency room at that time and was given pain medications. Comes back because of pleuritic chest pain on the NOv22nd. Found to have acute pulmonary emboli. Admitted to telemetry. Started on heparin drip.   Hospital Course  1. Acute pulmonary emboli: Started on heparin drip, changes related to  her liquids. Discussed risks and benefits of anticoagulation with the patient. To take Eliquis  10 mg by mouth twice a day till Nov 31st after that he can take  Eliquis  5 mg by mouth twice a day from a December 1 Ultrasound of the legs negative for DVT. Patient was given Eliquis  before but did not take it due to the cost as per cardiology note. We will provide 30 day coupon  today. And Patient Will Get Medications to Help from [facility]. Patient Primary Doctor  Is in [place]. #2 atrial fibrillation: Patient has sick sinus syndrome. Does have pacemaker. Did not have any left-sided chest pain. Troponin up to 0.16. Patient follows up with Dr. [name], supposed to have Lexiscan  stress test. Patient can follow up with him as an outpatient. Did not have any further chest pains. Rate is well controlled. Patient now was given Coreg but he became hypotensive and also lightheaded. So we are holding the Coreg at this time. #3 non-ST elevation MI: Secondary to acute PE. Patient is getting workup with Dr. [name] for  Angina.He needs to have Lexiscan stress test so he can follow up with him as an outpatient. Can continue Imdur. Beta blocker not given secondary to hypotension and lightheadedness  #4 chronic systolic heart failure with dilated cardiomyopathy EF of 20-25%. Because the Lasix 20 mg 3 times a day. Started on losartan. 25 mg daily. Patient blood pressure became very low with the combination of Coreg, lisinopril, losartan. I had to hold the Coreg, lisinopril. Medications related. Started as blood pressure permits.euvolemic,no evidence of chf.   #5 history of chest pain secondary to  fall. Required some oxycodone during the hospital stay.   Discharge Condition:    Follow UP  Follow-up Information    Follow up with [facility] .   Specialty:  [place] information:   [place] [phone]         Discharge Instructions  and  Discharge Medications      Medication List    STOP taking these medications        chlorthalidone 25 MG tablet  Commonly known as:  HYGROTON     isosorbide mononitrate 30 MG 24 hr tablet  Commonly known as:  IMDUR      TAKE these medications        apixaban 5 MG Tabs tablet  Commonly known as:  ELIQUIS  Take 2 tablets (10 mg total) by mouth 2 (two) times daily.     apixaban 5 MG Tabs tablet  Commonly known as:  ELIQUIS  Take 1 tablet (5 mg total) by mouth 2 (two) times daily.  Start taking on:  11/27/2015     atorvastatin 40 MG tablet  Commonly known as:  LIPITOR  Take 1 tablet (40 mg total) by mouth daily at 6 PM.     furosemide 20 MG tablet  Commonly known as:  LASIX  Take 20 mg by mouth 3 (three) times a week.     glipiZIDE 10 MG tablet  Commonly known as:  GLUCOTROL  Take 10 mg by mouth 2 (two) times daily before a meal.     losartan 25 MG tablet  Commonly known as:  COZAAR  Take 1 tablet (25 mg total) by mouth daily.     omeprazole 20 MG capsule  Commonly known as:  PRILOSEC  Take 20 mg by mouth daily.     terazosin 2 MG capsule  Commonly known as:  HYTRIN  Take 2 mg by mouth at bedtime.     traMADol 50 MG tablet  Commonly known as:  ULTRAM  Take 1 tablet (50 mg total) by mouth every 6 (six) hours as needed.     vitamin B-12 1000 MCG tablet  Commonly known as:  CYANOCOBALAMIN  Take 1,000 mcg by mouth daily.     zolpidem 5 MG tablet  Commonly known as:  AMBIEN  Take 5 mg by mouth at bedtime as needed for sleep.          Diet and Activity recommendation: See Discharge Instructions above   Consults obtained -cardiology   Major procedures and Radiology Reports - PLEASE review detailed and final reports for all details, in brief -      Dg Chest 2 View  11/18/2015  CLINICAL DATA:  Right-sided shoulder and chest pain with movement. Post fall on Saturday. EXAM: CHEST  2 VIEW COMPARISON:  11/15/2015;  04/18/2014 FINDINGS: Grossly unchanged borderline enlarged cardiac silhouette and mediastinal contours given reduced lung volumes. Stable position of support apparatus. Interval development of bilateral medial basilar heterogeneous possible airspace opacities. Interval development trace bilateral effusions. Mild cephalization of flow. No pneumothorax. No definite acute osseous abnormalities. Specifically, no definite displaced right-sided rib fractures. IMPRESSION: 1. Decreased lung volumes with worsening bibasilar heterogeneous potential airspace opacities in development small bilateral effusions constellation of findings worrisome for mild pulmonary edema with associated bibasilar atelectasis though underlying infection not excluded. Continued attention on follow-up is recommended 2. No definite displaced right-sided rib fractures. Electronically Signed   By: Eldridge Abrahams.D.  On: 11/18/2015 17:41   Dg Chest 2 View  11/15/2015  CLINICAL DATA:  Tripped over parking curb today, hit head and right side of chest. Pt has been SOB for over 6 months, has cardiology appt this week for eval. EXAM: CHEST  2 VIEW COMPARISON:  04/18/2014 FINDINGS: Cardiac silhouette is normal in size and configuration. Aorta is mildly uncoiled. No mediastinal or hilar masses or evidence of adenopathy. Mild reticular opacities noted at the lung bases likely combination scarring and subsegmental atelectasis. Lungs otherwise clear. No pleural effusion or pneumothorax. Left anterior chest wall single lead pacemaker is stable and well positioned. Bony thorax is demineralized but grossly intact. IMPRESSION: No acute cardiopulmonary disease. Electronically Signed   By: Lajean Manes M.D.   On: 11/15/2015 15:54   Ct Head Wo Contrast  11/15/2015  CLINICAL DATA:  Fall with head injury. Right head laceration. Neck pain . EXAM: CT HEAD WITHOUT CONTRAST CT CERVICAL SPINE WITHOUT CONTRAST TECHNIQUE: Multidetector CT imaging of the head and  cervical spine was performed following the standard protocol without intravenous contrast. Multiplanar CT image reconstructions of the cervical spine were also generated. COMPARISON:  None. FINDINGS: CT HEAD FINDINGS No evidence of parenchymal hemorrhage or extra-axial fluid collection. No mass lesion, mass effect, or midline shift. No CT evidence of acute infarction. There is intracranial atherosclerosis. Nonspecific subcortical and periventricular white matter hypodensity, most in keeping with chronic small vessel ischemic change. Cerebral volume is age appropriate. No ventriculomegaly. The visualized paranasal sinuses are essentially clear. The mastoid air cells are unopacified. No evidence of calvarial fracture. CT CERVICAL SPINE FINDINGS No fracture is detected in the cervical spine. No prevertebral soft tissue swelling. There is mild straightening of the cervical spine, usually due to positioning and/or muscle spasm. Dens is well positioned between the lateral masses of C1. The lateral masses appear well-aligned. There is severe degenerative disc disease at C6-7. Mild-to-moderate degenerative disc disease throughout the remaining cervical levels. Mild-to-moderate bilateral facet arthropathy. Moderate foraminal stenosis on the left at C4-5, on the right at C5-6 and bilaterally at C6-7. There is 2 mm retrolisthesis at C6-7. Visualized mastoid air cells appear clear. No evidence of intra-axial hemorrhage in the visualized brain. No gross cervical canal hematoma. No significant pulmonary nodules at the visualized lung apices. No cervical adenopathy or other significant neck soft tissue abnormality. IMPRESSION: 1. No evidence of acute intracranial abnormality. No calvarial fracture. 2. Intracranial atherosclerosis and chronic small vessel ischemic white matter change. 3. No cervical spine fracture. 4. Moderate degenerative disc disease in the cervical spine, most prominent at C6-7. Degenerative foraminal stenosis  and minimal spondylolisthesis as described. Electronically Signed   By: Ilona Sorrel M.D.   On: 11/15/2015 16:09   Ct Angio Chest Pe W/cm &/or Wo Cm  11/18/2015  CLINICAL DATA:  Right shoulder in chest pain with movement. Fell Saturday EXAM: CT ANGIOGRAPHY CHEST WITH CONTRAST TECHNIQUE: Multidetector CT imaging of the chest was performed using the standard protocol during bolus administration of intravenous contrast. Multiplanar CT image reconstructions and MIPs were obtained to evaluate the vascular anatomy. CONTRAST:  35mL OMNIPAQUE IOHEXOL 350 MG/ML SOLN COMPARISON:  None. FINDINGS: Right arm IV contrast administration. The SVC is patent. Left subclavian transvenous pacemaker lead extends towards the right ventricular apex. There is mild four-chamber cardiomegaly. RV/LV ratio less than 1. Partially occlusive segmental embolus in apical segment right upper lobe pulmonary artery branch. Occlusive segmental embolus in the right lower lobe pulmonary artery. Partially occlusive thrombus in left upper lobe pulmonary  artery. Segmental embolus distally in the lingular branch of the pulmonary artery. Pulmonary veins are patent. Moderate coronary calcifications. Incomplete opacification of the aortic lumen. There is 3 vessel brachiocephalic arterial origin anatomy with calcified plaque in the proximal left subclavian artery. Scattered plaque in the aortic arch and descending thoracic aorta without aneurysm. Moderate right and smaller left pleural effusions. No pericardial effusion. Subcentimeter prevascular, AP window, pretracheal, and precarinal lymph nodes. No definite hilar adenopathy. Dependent atelectasis/consolidation posteriorly in both lower lobes. Advanced degenerative disc disease C6-7 and T9-10. Sternum intact. Visualized portions of upper abdomen unremarkable. Review of the MIP images confirms the above findings. IMPRESSION: 1. Scattered bilateral central and segmental pulmonary emboli without CT evidence  of right heart strain. Critical Value/emergent results were called by telephone at the time of interpretation on 11/18/2015 at 6:47 pm to Dr. Shirlyn Goltz , who verbally acknowledged these results. 2. Bilateral pleural effusions, right greater than left, with atelectasis/consolidation posteriorly in both lower lobes. 3. Atherosclerosis, including aortic and coronary artery disease. Please note that although the presence of coronary artery calcium documents the presence of coronary artery disease, the severity of this disease and any potential stenosis cannot be assessed on this non-gated CT examination. Assessment for potential risk factor modification, dietary therapy or pharmacologic therapy may be warranted, if clinically indicated. Electronically Signed   By: Lucrezia Europe M.D.   On: 11/18/2015 18:49   Ct Cervical Spine Wo Contrast  11/15/2015  CLINICAL DATA:  Fall with head injury. Right head laceration. Neck pain . EXAM: CT HEAD WITHOUT CONTRAST CT CERVICAL SPINE WITHOUT CONTRAST TECHNIQUE: Multidetector CT imaging of the head and cervical spine was performed following the standard protocol without intravenous contrast. Multiplanar CT image reconstructions of the cervical spine were also generated. COMPARISON:  None. FINDINGS: CT HEAD FINDINGS No evidence of parenchymal hemorrhage or extra-axial fluid collection. No mass lesion, mass effect, or midline shift. No CT evidence of acute infarction. There is intracranial atherosclerosis. Nonspecific subcortical and periventricular white matter hypodensity, most in keeping with chronic small vessel ischemic change. Cerebral volume is age appropriate. No ventriculomegaly. The visualized paranasal sinuses are essentially clear. The mastoid air cells are unopacified. No evidence of calvarial fracture. CT CERVICAL SPINE FINDINGS No fracture is detected in the cervical spine. No prevertebral soft tissue swelling. There is mild straightening of the cervical spine, usually  due to positioning and/or muscle spasm. Dens is well positioned between the lateral masses of C1. The lateral masses appear well-aligned. There is severe degenerative disc disease at C6-7. Mild-to-moderate degenerative disc disease throughout the remaining cervical levels. Mild-to-moderate bilateral facet arthropathy. Moderate foraminal stenosis on the left at C4-5, on the right at C5-6 and bilaterally at C6-7. There is 2 mm retrolisthesis at C6-7. Visualized mastoid air cells appear clear. No evidence of intra-axial hemorrhage in the visualized brain. No gross cervical canal hematoma. No significant pulmonary nodules at the visualized lung apices. No cervical adenopathy or other significant neck soft tissue abnormality. IMPRESSION: 1. No evidence of acute intracranial abnormality. No calvarial fracture. 2. Intracranial atherosclerosis and chronic small vessel ischemic white matter change. 3. No cervical spine fracture. 4. Moderate degenerative disc disease in the cervical spine, most prominent at C6-7. Degenerative foraminal stenosis and minimal spondylolisthesis as described. Electronically Signed   By: Ilona Sorrel M.D.   On: 11/15/2015 16:09   US Venous Img Lower Bilateral  11/19/2015  CLINICAL DATA:  Pulmonary embolism.  Evaluate for DVT. EXAM: BILATERAL LOWER EXTREMITY VENOUS DOPPLER ULTRASOUND TECHNIQUE: Gray-scale  sonography with graded compression, as well as color Doppler and duplex ultrasound were performed to evaluate the lower extremity deep venous systems from the level of the common femoral vein and including the common femoral, femoral, profunda femoral, popliteal and calf veins including the posterior tibial, peroneal and gastrocnemius veins when visible. The superficial great saphenous vein was also interrogated. Spectral Doppler was utilized to evaluate flow at rest and with distal augmentation maneuvers in the common femoral, femoral and popliteal veins. COMPARISON:  None. FINDINGS: RIGHT  LOWER EXTREMITY Common Femoral Vein: No evidence of thrombus. Normal compressibility, respiratory phasicity and response to augmentation. Saphenofemoral Junction: No evidence of thrombus. Normal compressibility and flow on color Doppler imaging. Profunda Femoral Vein: No evidence of thrombus. Normal compressibility and flow on color Doppler imaging. Femoral Vein: No evidence of thrombus. Normal compressibility, respiratory phasicity and response to augmentation. Popliteal Vein: No evidence of thrombus. Normal compressibility, respiratory phasicity and response to augmentation. Calf Veins: No evidence of thrombus. Normal compressibility and flow on color Doppler imaging. LEFT LOWER EXTREMITY Common Femoral Vein: No evidence of thrombus. Normal compressibility, respiratory phasicity and response to augmentation. Saphenofemoral Junction: No evidence of thrombus. Normal compressibility and flow on color Doppler imaging. Profunda Femoral Vein: No evidence of thrombus. Normal compressibility and flow on color Doppler imaging. Femoral Vein: No evidence of thrombus. Normal compressibility, respiratory phasicity and response to augmentation. Popliteal Vein: No evidence of thrombus. Normal compressibility, respiratory phasicity and response to augmentation. Calf Veins: No evidence of thrombus. Normal compressibility and flow on color Doppler imaging. Other Findings:  None. IMPRESSION: No evidence of deep venous thrombosis in the bilateral lower extremities. Electronically Signed   By: Markus Daft M.D.   On: 11/19/2015 15:04    Micro Results    No results found for this or any previous visit (from the past 240 hour(s)).     Today   Subjective:   Donald Henry today ,.stable to go to ER.  Objective:   Blood pressure 130/60, pulse 60, temperature 98.2 F (36.8 C), temperature source Oral, resp. rate 18, height 5\' 8"  (1.727 m), weight 75.705 kg (166 lb 14.4 oz), SpO2 92 %.   Intake/Output Summary (Last 24  hours) at 11/24/15 0916 Last data filed at 11/23/15 1832  Gross per 24 hour  Intake    483 ml  Output      0 ml  Net    483 ml    Exam Awake Alert, Oriented x 3, No new F.N deficits, Normal affect [place].AT,PERRAL Supple Neck,No JVD, No cervical lymphadenopathy appriciated.  Symmetrical Chest wall movement, Good air movement bilaterally, CTAB RRR,No Gallops,Rubs or new Murmurs, No Parasternal Heave +ve B.Sounds, Abd Soft, Non tender, No organomegaly appriciated, No rebound -guarding or rigidity. No Cyanosis, Clubbing or edema, No new Rash or bruise  Data Review   CBC w Diff: Lab Results  Component Value Date   WBC 7.0 11/21/2015   WBC 6.0 04/18/2014   HGB 9.9* 11/21/2015   HGB 10.3* 04/19/2014   HCT 28.9* 11/21/2015   HCT 24.6* 04/18/2014   PLT 183 11/21/2015   PLT 132* 04/18/2014   LYMPHOPCT 9 11/20/2015   LYMPHOPCT 22.5 04/18/2014   MONOPCT 8 11/20/2015   MONOPCT 6.4 04/18/2014   EOSPCT 2 11/20/2015   EOSPCT 3.5 04/18/2014   BASOPCT 1 11/20/2015   BASOPCT 1.1 04/18/2014    CMP: Lab Results  Component Value Date   NA 131* 11/20/2015   NA 140 04/19/2014   K 4.1 11/20/2015  K 3.9 04/19/2014   CL 96* 11/20/2015   CL 103 04/19/2014   CO2 27 11/20/2015   CO2 29 04/19/2014   BUN 39* 11/20/2015   BUN 32* 04/19/2014   CREATININE 1.41* 11/20/2015   CREATININE 1.58* 04/19/2014   PROT 7.1 11/18/2015   PROT 6.7 04/16/2014   ALBUMIN 3.7 11/18/2015   ALBUMIN 3.6 04/16/2014   BILITOT 2.1* 11/18/2015   BILITOT 1.0 04/16/2014   ALKPHOS 148* 11/18/2015   ALKPHOS 64 04/16/2014   AST 28 11/18/2015   AST 26 04/16/2014   ALT 21 11/18/2015   ALT 49 04/16/2014  .   Total Time in preparing paper work, data evaluation and todays exam - 48 minutes  Ryan Palermo M.D on 11/24/2015 at 9:16 AM    Note: This dictation was prepared with Dragon dictation along with smaller phrase technology. Any transcriptional errors that result from this process are unintentional.

## 2015-11-24 NOTE — Progress Notes (Signed)
Patient is being discharge to [facility] via ems. RN report was given to Early Osmond. IV was removed with no signs of infection. Dressing clean, dry intact. Patient had some abrasion on right arm, hand and bilateral knees from a previous fall with no signs of infection. No wounds are pressure ulcers noted. No further needs from care management team.   Most recent vitals are listed Filed Vitals:   11/24/15 1312 11/24/15 1509  BP:  134/67  Pulse: 60 74  Temp:  97.9 F (36.6 C)  Resp:  20   Patient O2 sats are 97% on room air.

## 2015-12-16 DIAGNOSIS — I2699 Other pulmonary embolism without acute cor pulmonale: Secondary | ICD-10-CM | POA: Insufficient documentation

## 2015-12-16 DIAGNOSIS — R0681 Apnea, not elsewhere classified: Secondary | ICD-10-CM | POA: Insufficient documentation

## 2016-02-03 ENCOUNTER — Other Ambulatory Visit: Payer: Self-pay | Admitting: Internal Medicine

## 2016-02-03 DIAGNOSIS — J90 Pleural effusion, not elsewhere classified: Secondary | ICD-10-CM

## 2016-02-06 ENCOUNTER — Ambulatory Visit
Admission: RE | Admit: 2016-02-06 | Discharge: 2016-02-06 | Disposition: A | Discharge status: Home / Self Care | Payer: Medicare Other | Source: Ambulatory Visit | Attending: Internal Medicine | Admitting: Internal Medicine

## 2016-02-06 ENCOUNTER — Other Ambulatory Visit: Payer: Self-pay | Admitting: Internal Medicine

## 2016-02-06 DIAGNOSIS — Z9889 Other specified postprocedural states: Secondary | ICD-10-CM

## 2016-02-06 DIAGNOSIS — J90 Pleural effusion, not elsewhere classified: Secondary | ICD-10-CM | POA: Diagnosis not present

## 2016-02-06 LAB — BODY FLUID CELL COUNT WITH DIFFERENTIAL
EOS FL: 0 %
LYMPHS FL: 60 %
Monocyte-Macrophage-Serous Fluid: 16 %
NEUTROPHIL FLUID: 24 %
OTHER CELLS FL: 0 %
Total Nucleated Cell Count, Fluid: 651 cu mm

## 2016-02-06 LAB — AMYLASE: Amylase: 17 U/L — ABNORMAL LOW (ref 28–100)

## 2016-02-06 NOTE — Discharge Instructions (Signed)
Thoracentesis °A thoracentesis is a procedure to remove fluid that has built up in the space between the linings of the chest wall and the lungs (pleural space). It is normal to have a small amount of fluid in the pleural space. Some medical conditions, such as heart failure, pneumonia, kidney problems, or cancer, can create too much fluid. This extra fluid is removed using a needle that is inserted through the skin and tissue and into the pleural space. °A thoracentesis may be done to: °· Understand why there is extra fluid in the pleural space and create a treatment plan that is right for you. °· Help to get rid of shortness of breath, discomfort, or pain that is caused by the extra fluid. °LET YOUR HEALTH CARE PROVIDER KNOW ABOUT: °· Any allergies you have. °· All medicines you are taking, including vitamins, herbs, eye drops, creams, and over-the-counter medicines. This includes any use of steroids, either by mouth or in a cream.   °· Previous problems you or members of your family have had with the use of anesthetics. °· Any blood disorders you have, including any history of blood clots. °· Previous surgeries you have had. °· Medical conditions you have, including: °¨ The possibility of pregnancy, if this applies. °¨ Have a frequent cough or coughing episodes. °RISKS AND COMPLICATIONS °Generally, this is a safe procedure. However, problems may occur, including: °· Infection. °· Injury to the lung. °· Lung collapse. °· Bleeding. °BEFORE THE PROCEDURE °· You may have a chest X-ray or another imaging test, such as a CT scan or ultrasound, to determine the location and amount of fluid in your pleural space. °· Ask your health care provider about: °¨ Changing or stopping your regular medicines. This is especially important if you are taking diabetes medicines or blood thinners. °¨ Taking medicines such as aspirin and ibuprofen. These medicines can thin your blood. Do not take these medicines before your procedure if  your health care provider instructs you not to. °¨ Taking a cough suppressant if you have a frequent cough or coughing episodes. °· Plan to have someone take you home after the procedure. °PROCEDURE °· You will be asked to sit upright and lean slightly forward for the procedure. °· An area of your back will be cleaned with a germ-killing solution (antiseptic). °· You will be given a medicine that numbs the area (local anesthetic). °· A needle will be inserted between your ribs and into the pleural space. You may feel pressure or slight pain as the needle is positioned into the pleural space. °· Fluid will be removed from the pleural space through the needle. You may feel pressure as the fluid is removed. °· The needle will be taken out after the excess fluid has been removed. A sample of the fluid may be sent to be examined. °· The needle insertion site (puncture site) will be covered with a bandage (dressing). °The procedure may vary among health care providers and hospitals. °AFTER THE PROCEDURE °· A chest X-ray may be done to check the amount of fluid that remains in your pleural space. °· Your blood pressure, heart rate, breathing rate, and blood oxygen level will be monitored often until the medicines you were given have worn off. °· It is your responsibility to obtain your test results. Ask the lab or department performing the test when and how you will get your results. Talk with your health care provider if you have any questions about your results. °  °This information   is not intended to replace advice given to you by your health care provider. Make sure you discuss any questions you have with your health care provider. °  °Document Released: 06/28/2005 Document Revised: 01/03/2015 Document Reviewed: 09/24/2014 °Elsevier Interactive Patient Education ©2016 [employer] ° °

## 2016-02-06 NOTE — Procedures (Signed)
US guided right thoracentesis without difficulty  Complications:  None  Blood Loss: none  See dictation in canopy pacs

## 2016-02-09 LAB — PH, BODY FLUID: PH, BODY FLUID: 7.6

## 2016-02-09 LAB — BODY FLUID CULTURE: CULTURE: NO GROWTH

## 2016-02-09 LAB — CYTOLOGY - NON PAP

## 2016-02-19 ENCOUNTER — Inpatient Hospital Stay: Payer: Medicare Other

## 2016-02-19 ENCOUNTER — Inpatient Hospital Stay
Admission: EM | Admit: 2016-02-19 | Discharge: 2016-02-20 | DRG: 291 | Disposition: A | Discharge status: Home / Self Care | Payer: Medicare Other | Attending: Internal Medicine | Admitting: Internal Medicine

## 2016-02-19 ENCOUNTER — Emergency Department: Payer: Medicare Other

## 2016-02-19 ENCOUNTER — Encounter: Payer: Self-pay | Admitting: Radiology

## 2016-02-19 DIAGNOSIS — Z8711 Personal history of peptic ulcer disease: Secondary | ICD-10-CM

## 2016-02-19 DIAGNOSIS — Z7984 Long term (current) use of oral hypoglycemic drugs: Secondary | ICD-10-CM | POA: Diagnosis not present

## 2016-02-19 DIAGNOSIS — Z7901 Long term (current) use of anticoagulants: Secondary | ICD-10-CM | POA: Diagnosis not present

## 2016-02-19 DIAGNOSIS — N179 Acute kidney failure, unspecified: Secondary | ICD-10-CM | POA: Diagnosis present

## 2016-02-19 DIAGNOSIS — N183 Chronic kidney disease, stage 3 (moderate): Secondary | ICD-10-CM | POA: Diagnosis present

## 2016-02-19 DIAGNOSIS — Z79899 Other long term (current) drug therapy: Secondary | ICD-10-CM

## 2016-02-19 DIAGNOSIS — J9811 Atelectasis: Secondary | ICD-10-CM | POA: Diagnosis present

## 2016-02-19 DIAGNOSIS — I5043 Acute on chronic combined systolic (congestive) and diastolic (congestive) heart failure: Secondary | ICD-10-CM | POA: Diagnosis present

## 2016-02-19 DIAGNOSIS — J90 Pleural effusion, not elsewhere classified: Secondary | ICD-10-CM | POA: Diagnosis present

## 2016-02-19 DIAGNOSIS — J918 Pleural effusion in other conditions classified elsewhere: Secondary | ICD-10-CM

## 2016-02-19 DIAGNOSIS — R0902 Hypoxemia: Secondary | ICD-10-CM

## 2016-02-19 DIAGNOSIS — Z86711 Personal history of pulmonary embolism: Secondary | ICD-10-CM

## 2016-02-19 DIAGNOSIS — I2510 Atherosclerotic heart disease of native coronary artery without angina pectoris: Secondary | ICD-10-CM | POA: Diagnosis present

## 2016-02-19 DIAGNOSIS — I252 Old myocardial infarction: Secondary | ICD-10-CM | POA: Diagnosis not present

## 2016-02-19 DIAGNOSIS — E119 Type 2 diabetes mellitus without complications: Secondary | ICD-10-CM | POA: Diagnosis present

## 2016-02-19 DIAGNOSIS — J9601 Acute respiratory failure with hypoxia: Secondary | ICD-10-CM | POA: Diagnosis present

## 2016-02-19 DIAGNOSIS — I130 Hypertensive heart and chronic kidney disease with heart failure and stage 1 through stage 4 chronic kidney disease, or unspecified chronic kidney disease: Secondary | ICD-10-CM | POA: Diagnosis present

## 2016-02-19 DIAGNOSIS — K219 Gastro-esophageal reflux disease without esophagitis: Secondary | ICD-10-CM | POA: Diagnosis present

## 2016-02-19 DIAGNOSIS — Z950 Presence of cardiac pacemaker: Secondary | ICD-10-CM | POA: Diagnosis not present

## 2016-02-19 DIAGNOSIS — N400 Enlarged prostate without lower urinary tract symptoms: Secondary | ICD-10-CM | POA: Diagnosis present

## 2016-02-19 DIAGNOSIS — Z9889 Other specified postprocedural states: Secondary | ICD-10-CM

## 2016-02-19 DIAGNOSIS — J189 Pneumonia, unspecified organism: Secondary | ICD-10-CM

## 2016-02-19 DIAGNOSIS — I248 Other forms of acute ischemic heart disease: Secondary | ICD-10-CM | POA: Diagnosis present

## 2016-02-19 DIAGNOSIS — I340 Nonrheumatic mitral (valve) insufficiency: Secondary | ICD-10-CM | POA: Diagnosis present

## 2016-02-19 DIAGNOSIS — I482 Chronic atrial fibrillation: Secondary | ICD-10-CM | POA: Diagnosis present

## 2016-02-19 HISTORY — DX: Other pulmonary embolism without acute cor pulmonale: I26.99

## 2016-02-19 HISTORY — DX: Chronic combined systolic (congestive) and diastolic (congestive) heart failure: I50.42

## 2016-02-19 HISTORY — DX: Nonrheumatic mitral (valve) insufficiency: I34.0

## 2016-02-19 HISTORY — DX: Peptic ulcer, site unspecified, unspecified as acute or chronic, without hemorrhage or perforation: K27.9

## 2016-02-19 HISTORY — DX: Pleural effusion, not elsewhere classified: J90

## 2016-02-19 HISTORY — DX: Ischemic cardiomyopathy: I25.5

## 2016-02-19 HISTORY — DX: Chronic kidney disease, stage 3 (moderate): N18.3

## 2016-02-19 HISTORY — DX: Sick sinus syndrome: I49.5

## 2016-02-19 HISTORY — DX: Diaphragmatic hernia without obstruction or gangrene: K44.9

## 2016-02-19 HISTORY — DX: Anemia, unspecified: D64.9

## 2016-02-19 HISTORY — DX: Acute myocardial infarction, unspecified: I21.9

## 2016-02-19 HISTORY — DX: Benign prostatic hyperplasia without lower urinary tract symptoms: N40.0

## 2016-02-19 HISTORY — DX: Obesity, unspecified: E66.9

## 2016-02-19 HISTORY — DX: Chronic kidney disease, stage 3 unspecified: N18.30

## 2016-02-19 HISTORY — DX: Hypertensive heart disease without heart failure: I11.9

## 2016-02-19 LAB — PROTEIN, BODY FLUID: Total protein, fluid: <3 g/dL

## 2016-02-19 LAB — TROPONIN I
TROPONIN I: 0.11 ng/mL — AB (ref <0.031)
TROPONIN I: 0.11 ng/mL — AB (ref <0.031)
Troponin I: 0.1 ng/mL — ABNORMAL HIGH (ref <0.031)

## 2016-02-19 LAB — BODY FLUID CELL COUNT WITH DIFFERENTIAL
EOS FL: 0 %
LYMPHS FL: 57 %
MONOCYTE-MACROPHAGE-SEROUS FLUID: 29 %
NEUTROPHIL FLUID: 14 %
Other Cells, Fluid: 0 %
WBC FLUID: 367 uL

## 2016-02-19 LAB — BASIC METABOLIC PANEL
ANION GAP: 8 (ref 5–15)
BUN: 38 mg/dL — ABNORMAL HIGH (ref 6–20)
CALCIUM: 8.9 mg/dL (ref 8.9–10.3)
CO2: 27 mmol/L (ref 22–32)
Chloride: 104 mmol/L (ref 101–111)
Creatinine, Ser: 1.69 mg/dL — ABNORMAL HIGH (ref 0.61–1.24)
GFR, EST AFRICAN AMERICAN: 41 mL/min — AB (ref >60)
GFR, EST NON AFRICAN AMERICAN: 36 mL/min — AB (ref >60)
Glucose, Bld: 152 mg/dL — ABNORMAL HIGH (ref 65–99)
POTASSIUM: 4.9 mmol/L (ref 3.5–5.1)
SODIUM: 139 mmol/L (ref 135–145)

## 2016-02-19 LAB — GLUCOSE, SEROUS FLUID: Glucose, Fluid: 156 mg/dL

## 2016-02-19 LAB — CBC
HEMATOCRIT: 28.4 % — AB (ref 40.0–52.0)
HEMOGLOBIN: 9.7 g/dL — AB (ref 13.0–18.0)
MCH: 31.5 pg (ref 26.0–34.0)
MCHC: 34.1 g/dL (ref 32.0–36.0)
MCV: 92.4 fL (ref 80.0–100.0)
Platelets: 171 10*3/uL (ref 150–440)
RBC: 3.07 MIL/uL — ABNORMAL LOW (ref 4.40–5.90)
RDW: 18.9 % — ABNORMAL HIGH (ref 11.5–14.5)
WBC: 7.7 10*3/uL (ref 3.8–10.6)

## 2016-02-19 LAB — BRAIN NATRIURETIC PEPTIDE: B NATRIURETIC PEPTIDE 5: 1023 pg/mL — AB (ref 0.0–100.0)

## 2016-02-19 MED ORDER — SODIUM CHLORIDE 0.9% FLUSH: 3.0000 mL | INTRAVENOUS | Status: DC | PRN

## 2016-02-19 MED ORDER — ACETAMINOPHEN 325 MG PO TABS: 650.0000 mg | ORAL_TABLET | Freq: Four times a day (QID) | ORAL | Status: DC | PRN

## 2016-02-19 MED ORDER — PANTOPRAZOLE SODIUM 40 MG PO TBEC
40.0000 mg | DELAYED_RELEASE_TABLET | Freq: Every day | ORAL | Status: DC
  Administered 2016-02-20: 40 mg via ORAL
  Filled 2016-02-19: qty 1

## 2016-02-19 MED ORDER — TERAZOSIN HCL 2 MG PO CAPS
2.0000 mg | ORAL_CAPSULE | Freq: Every day | ORAL | Status: DC
  Administered 2016-02-19: 2 mg via ORAL
  Filled 2016-02-19 (×3): qty 1

## 2016-02-19 MED ORDER — GLIPIZIDE 5 MG PO TABS: 10.0000 mg | ORAL_TABLET | Freq: Every day | ORAL | Status: DC

## 2016-02-19 MED ORDER — ONDANSETRON HCL 4 MG/2ML IJ SOLN: 4.0000 mg | Freq: Four times a day (QID) | INTRAMUSCULAR | Status: DC | PRN

## 2016-02-19 MED ORDER — ACETAMINOPHEN 650 MG RE SUPP: 650.0000 mg | Freq: Four times a day (QID) | RECTAL | Status: DC | PRN

## 2016-02-19 MED ORDER — CHLORTHALIDONE 25 MG PO TABS
25.0000 mg | ORAL_TABLET | Freq: Every day | ORAL | Status: DC
  Filled 2016-02-19 (×2): qty 1

## 2016-02-19 MED ORDER — FUROSEMIDE 10 MG/ML IJ SOLN
40.0000 mg | Freq: Two times a day (BID) | INTRAMUSCULAR | Status: DC
  Administered 2016-02-19 – 2016-02-20 (×2): 40 mg via INTRAVENOUS
  Filled 2016-02-19 (×2): qty 4

## 2016-02-19 MED ORDER — ISOSORBIDE MONONITRATE ER 30 MG PO TB24
30.0000 mg | ORAL_TABLET | Freq: Every day | ORAL | Status: DC
  Administered 2016-02-20: 30 mg via ORAL
  Filled 2016-02-19: qty 1

## 2016-02-19 MED ORDER — ZOLPIDEM TARTRATE 5 MG PO TABS
5.0000 mg | ORAL_TABLET | Freq: Every evening | ORAL | Status: DC | PRN
  Administered 2016-02-19: 5 mg via ORAL
  Filled 2016-02-19: qty 1

## 2016-02-19 MED ORDER — SODIUM CHLORIDE 0.9% FLUSH: 3.0000 mL | Freq: Two times a day (BID) | INTRAVENOUS | Status: DC

## 2016-02-19 MED ORDER — FUROSEMIDE 10 MG/ML IJ SOLN: 40.0000 mg | Freq: Two times a day (BID) | INTRAMUSCULAR | Status: DC

## 2016-02-19 MED ORDER — CARVEDILOL 3.125 MG PO TABS
3.1250 mg | ORAL_TABLET | Freq: Two times a day (BID) | ORAL | Status: DC
  Administered 2016-02-19 – 2016-02-20 (×2): 3.125 mg via ORAL
  Filled 2016-02-19 (×2): qty 1

## 2016-02-19 MED ORDER — ONDANSETRON HCL 4 MG PO TABS: 4.0000 mg | ORAL_TABLET | Freq: Four times a day (QID) | ORAL | Status: DC | PRN

## 2016-02-19 MED ORDER — IOHEXOL 350 MG/ML SOLN
65.0000 mL | Freq: Once | INTRAVENOUS | Status: AC | PRN
  Administered 2016-02-19: 65 mL via INTRAVENOUS

## 2016-02-19 MED ORDER — GLIPIZIDE 5 MG PO TABS
10.0000 mg | ORAL_TABLET | Freq: Every day | ORAL | Status: DC
  Administered 2016-02-20: 10 mg via ORAL
  Filled 2016-02-19: qty 2

## 2016-02-19 MED ORDER — VITAMIN B-12 1000 MCG PO TABS
1000.0000 ug | ORAL_TABLET | Freq: Every day | ORAL | Status: DC
  Administered 2016-02-20: 1000 ug via ORAL
  Filled 2016-02-19: qty 1

## 2016-02-19 MED ORDER — APIXABAN 5 MG PO TABS
5.0000 mg | ORAL_TABLET | Freq: Two times a day (BID) | ORAL | Status: DC
  Administered 2016-02-19 – 2016-02-20 (×2): 5 mg via ORAL
  Filled 2016-02-19 (×2): qty 1

## 2016-02-19 NOTE — H&P (Addendum)
[facility] [place] NAME: Donald Henry    MR#:  [id number]  DATE OF BIRTH:  [birth year]  DATE OF ADMISSION:  02/19/2016  PRIMARY CARE PHYSICIAN: Madelyn Brunner, MD   REQUESTING/REFERRING PHYSICIAN: Dr. Conni Slipper  CHIEF COMPLAINT:   Chief Complaint  Patient presents with  . Shortness of Breath  . Chest Pain    HISTORY OF PRESENT ILLNESS:  Donald Henry  is a 83 y.o. male with a known history of type 2 diabetes without complication, history of cardiomyopathy ejection fraction of 25-30%, history of CHF, history of previous MI, hypertension, status post pacemaker who presents to the hospital due to shortness of breath. Patient says that he's been feeling short of breath on minimal exertion over the past few days. Patient initially underwent ultrasound-guided thoracentesis about 2 weeks ago and had 1.7 L of fluid removed and was feeling a lot better but his symptoms recurred a few days back. He admits to some chest pain which is vague and associated with some deep breathing. He denies any fevers chills cough, abdominal pain, nausea vomiting, dizziness, palpitations or any other associated symptoms. Patient presented to the emergency room and on x-ray was noted to have a significantly  large right-sided pleural effusion and therefore hospitalist services were contacted further treatment and evaluation.  PAST MEDICAL HISTORY:   Past Medical History  Diagnosis Date  . Diabetes mellitus without complication ([place])   . Pacemaker   . Coronary artery disease   . MI (myocardial infarction) ([place])   . Hernia, abdominal   . CHF (congestive heart failure) ([place])   . Hypertension   . Shortness of breath dyspnea     PAST SURGICAL HISTORY:  No past surgical history on file.  SOCIAL HISTORY:   Social History  Substance Use Topics  . Smoking status: Never Smoker   . Smokeless tobacco: Not on file  . Alcohol Use: No    FAMILY  HISTORY:   Family History  Problem Relation Age of Onset  . Hypertension Other   . Heart disease Father     DRUG ALLERGIES:  No Known Allergies  REVIEW OF SYSTEMS:   Review of Systems  Constitutional: Negative for fever and weight loss.  HENT: Negative for congestion, nosebleeds and tinnitus.   Eyes: Negative for blurred vision, double vision and redness.  Respiratory: Positive for shortness of breath. Negative for cough and hemoptysis.   Cardiovascular: Negative for chest pain, orthopnea, leg swelling and PND.  Gastrointestinal: Negative for nausea, vomiting, abdominal pain, diarrhea and melena.  Genitourinary: Negative for dysuria, urgency and hematuria.  Musculoskeletal: Negative for joint pain and falls.  Neurological: Negative for dizziness, tingling, sensory change, focal weakness, seizures, weakness and headaches.  Endo/Heme/Allergies: Negative for polydipsia. Does not bruise/bleed easily.  Psychiatric/Behavioral: Negative for depression and memory loss. The patient is not nervous/anxious.     MEDICATIONS AT HOME:   Prior to Admission medications   Medication Sig Start Date End Date Taking? Authorizing Provider  apixaban (ELIQUIS) 5 MG TABS tablet Take 1 tablet (5 mg total) by mouth 2 (two) times daily. 11/27/15  Yes Epifanio Lesches, MD  chlorthalidone (HYGROTON) 25 MG tablet Take 25 mg by mouth daily.   Yes Historical Provider, MD  furosemide (LASIX) 20 MG tablet Take 20 mg by mouth daily.    Yes Historical Provider, MD  glipiZIDE (GLUCOTROL) 10 MG tablet Take 10 mg by mouth daily.    Yes Historical Provider, MD  isosorbide mononitrate (IMDUR) 30 MG 24 hr tablet Take 30 mg by mouth daily.   Yes Historical Provider, MD  omeprazole (PRILOSEC) 20 MG capsule Take 20 mg by mouth daily.   Yes Historical Provider, MD  terazosin (HYTRIN) 2 MG capsule Take 2 mg by mouth at bedtime.   Yes Historical Provider, MD  traMADol (ULTRAM) 50 MG tablet Take 1 tablet (50 mg total) by  mouth every 6 (six) hours as needed. Patient taking differently: Take 50 mg by mouth every 6 (six) hours as needed for moderate pain.  11/15/15 11/14/16 Yes Nance Pear, MD  vitamin B-12 (CYANOCOBALAMIN) 1000 MCG tablet Take 1,000 mcg by mouth daily.   Yes Historical Provider, MD  zolpidem (AMBIEN) 5 MG tablet Take 5 mg by mouth at bedtime as needed for sleep.   Yes Historical Provider, MD      VITAL SIGNS:  Blood pressure 136/71, pulse 70, temperature 97.9 F (36.6 C), temperature source Oral, resp. rate 25, height 5\' 7"  (1.702 m), weight 78.019 kg (172 lb), SpO2 96 %.  PHYSICAL EXAMINATION:  Physical Exam  GENERAL:  82 y.o.-year-old patient lying in the bed in no acute distress.  EYES: Pupils equal, round, reactive to light and accommodation. No scleral icterus. Extraocular muscles intact.  HEENT: Head atraumatic, normocephalic. Oropharynx and nasopharynx clear. No oropharyngeal erythema, moist oral mucosa  NECK:  Supple, no jugular venous distention. No thyroid enlargement, no tenderness.  LUNGS: Poor air entry on the right mid to lower lung fields. no wheezing, rales, rhonchi. No use of accessory muscles of respiration.  CARDIOVASCULAR: S1, S2 RRR. No murmurs, rubs, gallops, clicks.  ABDOMEN: Soft, nontender, nondistended. Bowel sounds present. No organomegaly or mass.  EXTREMITIES: No pedal edema, cyanosis, or clubbing. + 2 pedal & radial pulses b/l.   NEUROLOGIC: Cranial nerves II through XII are intact. No focal Motor or sensory deficits appreciated b/l PSYCHIATRIC: The patient is alert and oriented x 3. Good affect.  SKIN: No obvious rash, lesion, or ulcer.   LABORATORY PANEL:   CBC  Recent Labs Lab 02/19/16 0808  WBC 7.7  HGB 9.7*  HCT 28.4*  PLT 171   ------------------------------------------------------------------------------------------------------------------  Chemistries   Recent Labs Lab 02/19/16 0808  NA 139  K 4.9  CL 104  CO2 27  GLUCOSE 152*   BUN 38*  CREATININE 1.69*  CALCIUM 8.9   ------------------------------------------------------------------------------------------------------------------  Cardiac Enzymes  Recent Labs Lab 02/19/16 1047  TROPONINI 0.11*   ------------------------------------------------------------------------------------------------------------------  RADIOLOGY:  Dg Chest 2 View  02/19/2016  CLINICAL DATA:  Shortness of breath and chest pain. EXAM: CHEST  2 VIEW COMPARISON:  02/06/2016 FINDINGS: Increased densities in the right lower chest are suggestive for re-accumulation of right pleural fluid. Probable subsegmental atelectasis in the right lung. Question a small left pleural effusion. Heart size is grossly stable. There is a single lead cardiac pacemaker. IMPRESSION: Increased densities in the right lower chest. Findings are most compatible with a moderate sized right pleural effusion and associated atelectasis. Electronically Signed   By: Markus Daft M.D.   On: 02/19/2016 08:40   Ct Angio Chest Pe W/cm &/or Wo Cm  02/19/2016  CLINICAL DATA:  Shortness of breath for 4 days. Recent history of pulmonary embolism and right-sided thoracentesis. No history of malignancy. EXAM: CT ANGIOGRAPHY CHEST WITH CONTRAST TECHNIQUE: Multidetector CT imaging of the chest was performed using the standard protocol during bolus administration of intravenous contrast. Multiplanar CT image reconstructions and MIPs were obtained to evaluate the vascular anatomy. CONTRAST:  35mL OMNIPAQUE IOHEXOL 350 MG/ML SOLN COMPARISON:  Radiographs 02/19/2016.  CT 11/18/2015. FINDINGS: Mediastinum: The pulmonary arteries are well opacified with contrast. No evidence of recurrent pulmonary embolism. There is atherosclerosis of the aorta, great vessels and coronary arteries.The heart size is normal. There is no pericardial effusion. The left subclavian pacemaker appears unchanged. There are no enlarged mediastinal, hilar or axillary lymph  nodes. The thyroid gland, trachea and esophagus demonstrate no significant findings. Lungs/Pleura: Right-sided pleural effusion has enlarged and is now large. A small dependent left-sided pleural effusion has not significantly changed. There are worsening dependent airspace opacities within the right lower and middle lobes, probably due to atelectasis. There is stable left lower lobe atelectasis.No evidence of endobronchial lesion or pneumothorax. Upper abdomen: No acute findings are seen within the visualized upper abdomen. Mild contour irregularity of the liver suggests cirrhosis. The adrenal glands appear unchanged. There are diffuse vascular calcifications. Musculoskeletal/Chest wall: No chest wall lesion or acute osseous findings.Thoracic spine degenerative changes and an old left-sided rib fracture are unchanged. Review of the MIP images confirms the above findings. IMPRESSION: 1. No evidence of recurrent acute pulmonary embolism. 2. Enlarging right pleural effusion, now large. Stable small left pleural effusion. 3. Worsening airspace opacities at the right lung base, probably atelectasis. 4. Diffuse atherosclerosis. 5. Possible cirrhosis. Electronically Signed   By: Richardean Sale M.D.   On: 02/19/2016 12:35     IMPRESSION AND PLAN:   82 year old male with past medical history of hypertension, history of CHF, history of previous MI, cardiomyopathy ejection fraction 25-30%, history of pulmonary embolism, BPH, GERD who presents to the hospital due to shortness of breath.  #1 acute respiratory failure with hypoxia-this is secondary to the significantly large right-sided pleural effusion. -I will get a right-sided ultrasound-guided thoracentesis. We'll follow the patient clinically and wean off oxygen as tolerated.  #2 right-sided pleural effusion-this is recurrent in nature. Patient underwent ultrasound-guided thoracentesis 2 weeks ago and the fluid has now recurred. -I will get a repeat  ultrasound-guided thoracentesis today. Send off fluid for glucose, LDH, pH, culture, cell-count, Cytology and monitor.  Will get Pulmonary Consult.  -This is probably related to underlying CHF and cardiomyopathy but I will await body fluid analysis.  #3 elevated troponin-likely in the setting of demand ischemia from the acute respiratory failure. -Observe on telemetry, cycle cardiac markers. Next  #4 CHF-acute on chronic systolic dysfunction. -I will diurese the patient with IV Lasix, follow I's and O's and daily weights. -Will start Coreg at low dose, hold off on starting ACE given mild ARF.   #5 BPH-continue Hytrin  #6 GERD-continue Protonix.  #7 HTN - cont. Imdur, Chlorthalidone.  - will start low dose ACE given CM.   #8 hx of PE - cont. Apixiban.    All the records are reviewed and case discussed with ED provider. Management plans discussed with the patient, family and they are in agreement.  CODE STATUS: Full   TOTAL TIME TAKING CARE OF THIS PATIENT: 50 minutes.    Henreitta Leber M.D on 02/19/2016 at 2:10 PM  Between 7am to 6pm - Pager - [phone]  After 6pm go to [email or web address] - password [email or web address] [facility]  Office  [phone]  CC: Primary care physician; Madelyn Brunner, MD

## 2016-02-19 NOTE — ED Notes (Addendum)
Pt presents to ED with c/o [facility] and chest pain that started Monday. EMS reports pt was 88% O2 sats on RA, improved to 95% on 4L via [place]. EMS reports giving 324mg  ASA and 0.4mg  SL Nitro PTA. Pt is A&O, in NAD, with respirations even, regular, and unlabored. Pt denies chest pain currently, reports increasing SHOB upon exertion.

## 2016-02-19 NOTE — ED Provider Notes (Signed)
[facility] Emergency Department Provider Note  ____________________________________________  Time seen: Approximately 12:49 PM  I have reviewed the triage vital signs and the nursing notes.   HISTORY  Chief Complaint Shortness of Breath and Chest Pain    HPI Donald Henry is a 82 y.o. male who comes in complaining of shortness of breath. He told the nurse that his chest was hurting he tells me it really isn't hurting at all. He had bilateral PEs in the past. He had fluid drained out from his pleural space about 2 weeks ago. He reports he's again very short of breath. It's worse with exercise walking. His son reports she can barely walk 15 feet before he gets short of breath. It improved after the thoracentesis on the 10th but has worsened ever since then. Now is fairly severe.   Past Medical History  Diagnosis Date  . Diabetes mellitus without complication ([place])   . Pacemaker   . Coronary artery disease   . MI (myocardial infarction) ([place])   . Hernia, abdominal   . CHF (congestive heart failure) ([place])   . Hypertension   . Shortness of breath dyspnea     Patient Active Problem List   Diagnosis Date Noted  . Acute pulmonary embolism ([place]) 11/18/2015  . Atrial fibrillation ([place]) 11/18/2015    No past surgical history on file.  Current Outpatient Rx  Name  Route  Sig  Dispense  Refill  . apixaban (ELIQUIS) 5 MG TABS tablet   Oral   Take 2 tablets (10 mg total) by mouth 2 (two) times daily. Patient not taking: Reported on 02/06/2016   60 tablet   0   . apixaban (ELIQUIS) 5 MG TABS tablet   Oral   Take 1 tablet (5 mg total) by mouth 2 (two) times daily.   10 tablet   0   . atorvastatin (LIPITOR) 40 MG tablet   Oral   Take 1 tablet (40 mg total) by mouth daily at 6 PM. Patient not taking: Reported on 02/06/2016   30 tablet   0   . chlorthalidone (HYGROTON) 25 MG tablet   Oral   Take 25 mg by mouth daily.         . ferrous sulfate  325 (65 FE) MG tablet   Oral   Take 325 mg by mouth daily with breakfast.         . furosemide (LASIX) 20 MG tablet   Oral   Take 20 mg by mouth daily.          Marland Kitchen glipiZIDE (GLUCOTROL) 10 MG tablet   Oral   Take 10 mg by mouth once.          . isosorbide mononitrate (IMDUR) 30 MG 24 hr tablet   Oral   Take 30 mg by mouth daily.         Marland Kitchen losartan (COZAAR) 25 MG tablet   Oral   Take 1 tablet (25 mg total) by mouth daily. Patient not taking: Reported on 02/06/2016   20 tablet   0   . omeprazole (PRILOSEC) 20 MG capsule   Oral   Take 20 mg by mouth daily.         Marland Kitchen terazosin (HYTRIN) 2 MG capsule   Oral   Take 2 mg by mouth at bedtime.         . traMADol (ULTRAM) 50 MG tablet   Oral   Take 1 tablet (50 mg total) by mouth  every 6 (six) hours as needed. Patient taking differently: Take 50 mg by mouth every 6 (six) hours as needed for moderate pain.    20 tablet   0   . vitamin B-12 (CYANOCOBALAMIN) 1000 MCG tablet   Oral   Take 1,000 mcg by mouth daily.         Marland Kitchen zolpidem (AMBIEN) 5 MG tablet   Oral   Take 5 mg by mouth at bedtime as needed for sleep.           Allergies Review of patient's allergies indicates no known allergies.  Family History  Problem Relation Age of Onset  . Hypertension Other     Social History Social History  Substance Use Topics  . Smoking status: Never Smoker   . Smokeless tobacco: None  . Alcohol Use: No    Review of Systems Constitutional: No fever/chills Eyes: No visual changes. ENT: No sore throat. Cardiovascular: Denies chest pain. Respiratory:  shortness of breath. Gastrointestinal: No abdominal pain.  No nausea, no vomiting.  No diarrhea.  No constipation. Genitourinary: Negative for dysuria. Musculoskeletal: Negative for back pain. Skin: Negative for rash. Neurological: Negative for headaches, focal weakness or numbness.  10-point ROS otherwise  negative.  ____________________________________________   PHYSICAL EXAM:  VITAL SIGNS: ED Triage Vitals  Enc Vitals Group     BP 02/19/16 0753 129/75 mmHg     Pulse Rate 02/19/16 0753 66     Resp 02/19/16 0753 19     Temp 02/19/16 0753 97.9 F (36.6 C)     Temp Source 02/19/16 0753 Oral     SpO2 02/19/16 0753 87 %     Weight 02/19/16 0753 172 lb (78.019 kg)     Height 02/19/16 0753 5\' 7"  (1.702 m)     Head Cir --      Peak Flow --      Pain Score 02/19/16 0754 0     Pain Loc --      Pain Edu? --      Excl. in [place]? --    Constitutional: Alert and oriented. Well appearing and in no acute distress. Eyes: Conjunctivae are normal. PERRL. EOMI. Head: Atraumatic. Nose: No congestion/rhinnorhea. Mouth/Throat: Mucous membranes are moist.  Oropharynx non-erythematous. Neck: No stridor.   Cardiovascular: Normal rate, regular rhythm. Grossly normal heart sounds.  Good peripheral circulation. Respiratory: Normal respiratory effort.  No retractions. Lungs CTAB. Decreased breath sounds in the bases especially on the right Gastrointestinal: Soft and nontender. No distention. No abdominal bruits. No CVA tenderness. Musculoskeletal: No lower extremity tenderness nor edema.  No joint effusions. Neurologic:  Normal speech and language. No gross focal neurologic deficits are appreciated. No gait instability. Skin:  Skin is warm, dry and intact. No rash noted. Psychiatric: Mood and affect are normal. Speech and behavior are normal.  ____________________________________________   LABS (all labs ordered are listed, but only abnormal results are displayed)  Labs Reviewed  BASIC METABOLIC PANEL - Abnormal; Notable for the following:    Glucose, Bld 152 (*)    BUN 38 (*)    Creatinine, Ser 1.69 (*)    GFR calc non Af Amer 36 (*)    GFR calc Af Amer 41 (*)    All other components within normal limits  CBC - Abnormal; Notable for the following:    RBC 3.07 (*)    Hemoglobin 9.7 (*)    HCT  28.4 (*)    RDW 18.9 (*)    All other components within  normal limits  TROPONIN I - Abnormal; Notable for the following:    Troponin I 0.11 (*)    All other components within normal limits  BRAIN NATRIURETIC PEPTIDE - Abnormal; Notable for the following:    B Natriuretic Peptide 1023.0 (*)    All other components within normal limits  TROPONIN I - Abnormal; Notable for the following:    Troponin I 0.11 (*)    All other components within normal limits   ____________________________________________  EKG  EKG read and interpreted by me shows a fully paced rhythm at a rate of 64 ____________________________________________  RADIOLOGY  Chest x-ray read by radiology reviewed by me as effusion and atelectasis especially on the right. CT of the chest read by radiologist as small left pleural effusion and large right pleural effusion increased in size since previously no PE seen ____________________________________________   PROCEDURES  Patient's O2 sats are 8889 her room air. ____________________________________________   INITIAL IMPRESSION / ASSESSMENT AND PLAN / ED COURSE  Pertinent labs & imaging results that were available during my care of the patient were reviewed by me and considered in my medical decision making (see chart for details).   ____________________________________________   FINAL CLINICAL IMPRESSION(S) / ED DIAGNOSES  Final diagnoses:  Acute on chronic combined systolic and diastolic congestive heart failure (HCC)  Pleural effusion associated with pulmonary infection  Hypoxia      Nena Polio, MD 02/19/16 [phone]

## 2016-02-19 NOTE — Progress Notes (Signed)
Date: 02/19/2016,   MRN# [id number] ACXEL TUMMINIA 08-13-1930 Code Status:     Code Status Orders        Start     Ordered   02/19/16 1513  Full code   Continuous     02/19/16 1512    Code Status History    Date Active Date Inactive Code Status Order ID Comments User Context   11/18/2015  8:31 PM 11/24/2015  6:46 PM Full Code [id number]  L[place]le [place], MD ED    Advance Directive Documentation        Most Recent Value   Type of Advance Directive  Living will, Healthcare Power of Attorney   Pre-existing out of facility DNR order (yellow form or pink MOST form)     "MOST" Form in Place?       Hosp day:@LENGTHOFSTAYDAYS @ Referring MD: @ATDPROV @         AdmissionWeight: 172 lb (78.019 kg)                 CurrentWeight: 171 lb 14.4 oz (77.973 kg)   CC: Biateral pleural effusion, sob  HPI: This is an 43 year ols male back here with sob, recurrent right pleural effusion, small left effusion, EF 25-30 %. S/p right thoracentesis 2 weeks ago 1.7 liers was removed. Initially felt much better. He is on  Apixiban for pulmonary embolism. There is no history today of angina, pleurisy or leg pain. Peripheral edema present.   PMHX:   Past Medical History  Diagnosis Date  . Diabetes mellitus without complication ([place])   . Pacemaker   . Coronary artery disease   . MI (myocardial infarction) ([place])   . Hernia, abdominal   . CHF (congestive heart failure) ([place])   . Hypertension   . Shortness of breath dyspnea    Surgical Hx:  History reviewed. No pertinent past surgical history. Family Hx:  Family History  Problem Relation Age of Onset  . Hypertension Other   . Heart disease Father    Social Hx:   Social History  Substance Use Topics  . Smoking status: Never Smoker   . Smokeless tobacco: None  . Alcohol Use: No   Medication:    Home Medication:  No current outpatient prescriptions on file.  Current Medication: @CURMEDTAB @   Allergies:  Review of patient's allergies  indicates no known allergies.  Review of Systems: Gen:  Denies  fever, sweats, chills HEENT: Denies blurred vision, double vision, ear pain, eye pain, hearing loss, nose bleeds, sore throat Cvc:  No dizziness, chest pain or heaviness Resp:  Increase sob, no pleurisy, no bleeding   Gi: Denies swallowing difficulty, stomach pain, nausea or vomiting, diarrhea, constipation, bowel incontinence Gu:  Denies bladder incontinence, burning urine Ext:   No Joint pain, stiffness or swelling Skin: No skin rash, easy bruising or bleeding or hives Endoc:  No polyuria, polydipsia , polyphagia or weight change Psych: No depression, insomnia or hallucinations  Other:  All other systems negative  Physical Examination:   VS: BP 144/76 mmHg  Pulse 70  Temp(Src) 97.6 F (36.4 C) (Oral)  Resp 22  Ht 5\' 7"  (1.702 m)  Wt 171 lb 14.4 oz (77.973 kg)  BMI 26.92 kg/m2  SpO2 96%  General Appearance: mild distress  Neuro/psych without focal findings, mental status, speech normal, alert and oriented, cranial nerves 2-12 intact, reflexes normal and symmetric, sensation grossly normal  HEENT: PERRLA, EOM intact, no ptosis, no other lesions noticed Pulmonary:.No wheezing, No rales  decrease bs at the base r > l   Cardiovascular:  Normal S1,S2.  No m/r/g.    Abdomen:Benign, Soft, non-tender, No masses, hepatosplenomegaly, No lymphadenopathy Endoc: No evident thyromegaly, no signs of acromegaly or Cushing features Skin:   warm, no rashes, no ecchymosis  Extremities: normal, no cyanosis, clubbing, +ve edema, warm with normal capillary refill.  Labs results:   Recent Labs     02/19/16  0808  HGB  9.7*  HCT  28.4*  MCV  92.4  WBC  7.7  BUN  38*  CREATININE  1.69*  GLUCOSE  152*  CALCIUM  8.9  ,    No results for input(s): PH in the last 72 hours.  Invalid input(s): PCO2, PO2, BASEEXCESS, BASEDEFICITE, TFT  Culture results:     Rad results:   Dg Chest 2 View  02/19/2016  CLINICAL DATA:   Shortness of breath and chest pain. EXAM: CHEST  2 VIEW COMPARISON:  02/06/2016 FINDINGS: Increased densities in the right lower chest are suggestive for re-accumulation of right pleural fluid. Probable subsegmental atelectasis in the right lung. Question a small left pleural effusion. Heart size is grossly stable. There is a single lead cardiac pacemaker. IMPRESSION: Increased densities in the right lower chest. Findings are most compatible with a moderate sized right pleural effusion and associated atelectasis. Electronically Signed   By: Markus Daft M.D.   On: 02/19/2016 08:40   Ct Angio Chest Pe W/cm &/or Wo Cm  02/19/2016  CLINICAL DATA:  Shortness of breath for 4 days. Recent history of pulmonary embolism and right-sided thoracentesis. No history of malignancy. EXAM: CT ANGIOGRAPHY CHEST WITH CONTRAST TECHNIQUE: Multidetector CT imaging of the chest was performed using the standard protocol during bolus administration of intravenous contrast. Multiplanar CT image reconstructions and MIPs were obtained to evaluate the vascular anatomy. CONTRAST:  23mL OMNIPAQUE IOHEXOL 350 MG/ML SOLN COMPARISON:  Radiographs 02/19/2016.  CT 11/18/2015. FINDINGS: Mediastinum: The pulmonary arteries are well opacified with contrast. No evidence of recurrent pulmonary embolism. There is atherosclerosis of the aorta, great vessels and coronary arteries.The heart size is normal. There is no pericardial effusion. The left subclavian pacemaker appears unchanged. There are no enlarged mediastinal, hilar or axillary lymph nodes. The thyroid gland, trachea and esophagus demonstrate no significant findings. Lungs/Pleura: Right-sided pleural effusion has enlarged and is now large. A small dependent left-sided pleural effusion has not significantly changed. There are worsening dependent airspace opacities within the right lower and middle lobes, probably due to atelectasis. There is stable left lower lobe atelectasis.No evidence of  endobronchial lesion or pneumothorax. Upper abdomen: No acute findings are seen within the visualized upper abdomen. Mild contour irregularity of the liver suggests cirrhosis. The adrenal glands appear unchanged. There are diffuse vascular calcifications. Musculoskeletal/Chest wall: No chest wall lesion or acute osseous findings.Thoracic spine degenerative changes and an old left-sided rib fracture are unchanged. Review of the MIP images confirms the above findings. IMPRESSION: 1. No evidence of recurrent acute pulmonary embolism. 2. Enlarging right pleural effusion, now large. Stable small left pleural effusion. 3. Worsening airspace opacities at the right lung base, probably atelectasis. 4. Diffuse atherosclerosis. 5. Possible cirrhosis. Electronically Signed   By: Richardean Sale M.D.   On: 02/19/2016 12:35   SPECIMEN SUBMITTED: GROSS DESCRIPTION:  A. Site: Right pleural fluid    Volume: 600 mL    Description: Tan-yellow fluid    Submitted for:       ThinPrep       Cell block(s): 1  A. Pleural fluid, right   CLINICAL HISTORY:  None Provided   PRE-OPERATIVE DIAGNOSIS:  None provided   POST-OPERATIVE DIAGNOSIS:  None provided.   DIAGNOSIS:  A. RIGHT PLEURAL FLUID; THORACENTESIS:  - INFLAMMATION, MESOTHELIAL CELLS AND MACROPHAGES.   Note:  A cell block was included the interpretation of this case.     Assessment and Plan: RECURRENT ENLARGING RIGHT PLEURAL EFFUSION, SMALL LEFT EFFUSION, ? DUE CHF ( EF 25-30 %), HYPOPROTEINEMIA,. AS A RESULT HAS COMPRESSIVE ATELECTASIS -right thoracentesis (ldh, t.prot, gluc, cell count, albumen, cytology)  -check tsh, sed rate -further orders per above  PHX OF PULMONARY EMBOLISM -continue Apixiban   I have personally obtained a history, examined the patient, evaluated laboratory and imaging results, formulated the assessment and plan and placed orders.  The Patient requires high complexity decision making for assessment and  support, frequent evaluation and titration of therapies, application of advanced monitoring technologies and extensive interpretation of multiple databases.   Herbon Fleming,M.D. Pulmonary & Critical care Medicine [facility]

## 2016-02-20 ENCOUNTER — Encounter: Payer: Self-pay | Admitting: Nurse Practitioner

## 2016-02-20 LAB — BASIC METABOLIC PANEL
Anion gap: 8 (ref 5–15)
BUN: 34 mg/dL — AB (ref 6–20)
CALCIUM: 8.6 mg/dL — AB (ref 8.9–10.3)
CHLORIDE: 103 mmol/L (ref 101–111)
CO2: 28 mmol/L (ref 22–32)
CREATININE: 1.67 mg/dL — AB (ref 0.61–1.24)
GFR, EST AFRICAN AMERICAN: 42 mL/min — AB (ref >60)
GFR, EST NON AFRICAN AMERICAN: 36 mL/min — AB (ref >60)
Glucose, Bld: 93 mg/dL (ref 65–99)
Potassium: 4.1 mmol/L (ref 3.5–5.1)
SODIUM: 139 mmol/L (ref 135–145)

## 2016-02-20 LAB — CBC
HCT: 27.8 % — ABNORMAL LOW (ref 40.0–52.0)
Hemoglobin: 9.5 g/dL — ABNORMAL LOW (ref 13.0–18.0)
MCH: 31.3 pg (ref 26.0–34.0)
MCHC: 34 g/dL (ref 32.0–36.0)
MCV: 92 fL (ref 80.0–100.0)
PLATELETS: 182 10*3/uL (ref 150–440)
RBC: 3.02 MIL/uL — AB (ref 4.40–5.90)
RDW: 18.2 % — AB (ref 11.5–14.5)
WBC: 7.5 10*3/uL (ref 3.8–10.6)

## 2016-02-20 LAB — TSH: TSH: 1.195 u[IU]/mL (ref 0.350–4.500)

## 2016-02-20 LAB — LACTATE DEHYDROGENASE: LDH: 169 U/L (ref 98–192)

## 2016-02-20 LAB — SEDIMENTATION RATE: SED RATE: 19 mm/h (ref 0–20)

## 2016-02-20 MED ORDER — ATORVASTATIN CALCIUM 40 MG PO TABS: 40.0000 mg | ORAL_TABLET | Freq: Every day | ORAL | Status: DC

## 2016-02-20 MED ORDER — SIMVASTATIN 40 MG PO TABS: 40.0000 mg | ORAL_TABLET | Freq: Every day | ORAL | Status: DC

## 2016-02-20 MED ORDER — CARVEDILOL 3.125 MG PO TABS: 3.1250 mg | ORAL_TABLET | Freq: Two times a day (BID) | ORAL | Status: DC

## 2016-02-20 MED ORDER — APIXABAN 2.5 MG PO TABS: 2.5000 mg | ORAL_TABLET | Freq: Two times a day (BID) | ORAL | Status: DC

## 2016-02-20 NOTE — Progress Notes (Signed)
[facility] [place] NAME: Donald Henry    MR#:  [id number]  DATE OF BIRTH:  08/12/1942  SUBJECTIVE:   doing well after thoracentesis.  REVIEW OF SYSTEMS:    Review of Systems  Constitutional: Negative for fever, chills and malaise/fatigue.  HENT: Negative for sore throat.   Eyes: Negative for blurred vision.  Respiratory: Negative for cough, hemoptysis, shortness of breath and wheezing.   Cardiovascular: Negative for chest pain, palpitations and leg swelling.  Gastrointestinal: Negative for nausea, vomiting, abdominal pain, diarrhea and blood in stool.  Genitourinary: Negative for dysuria.  Musculoskeletal: Negative for back pain.  Neurological: Negative for dizziness, tremors and headaches.  Endo/Heme/Allergies: Does not bruise/bleed easily.    Tolerating Diet:yes      DRUG ALLERGIES:  No Known Allergies  VITALS:  Blood pressure 119/57, pulse 60, temperature 98.1 F (36.7 C), temperature source Oral, resp. rate 20, height 5\' 7"  (1.702 m), weight 76.295 kg (168 lb 3.2 oz), SpO2 95 %.  PHYSICAL EXAMINATION:   Physical Exam  Constitutional: He is oriented to person, place, and time and well-developed, well-nourished, and in no distress. No distress.  HENT:  Head: Normocephalic.  Eyes: No scleral icterus.  Neck: Normal range of motion. Neck supple. No JVD present. No tracheal deviation present.  Cardiovascular: Normal rate and regular rhythm.  Exam reveals no gallop and no friction rub.   Murmur heard. Pulmonary/Chest: Effort normal and breath sounds normal. No respiratory distress. He has no wheezes. He has no rales. He exhibits no tenderness.  Abdominal: Soft. Bowel sounds are normal. He exhibits no distension and no mass. There is no tenderness. There is no rebound and no guarding.  Musculoskeletal: Normal range of motion. He exhibits no edema.  Neurological: He is alert and oriented to person, place, and time.   Skin: Skin is warm. No rash noted. No erythema.  Psychiatric: Affect and judgment normal.      LABORATORY PANEL:   CBC  Recent Labs Lab 02/20/16 0419  WBC 7.5  HGB 9.5*  HCT 27.8*  PLT 182   ------------------------------------------------------------------------------------------------------------------  Chemistries   Recent Labs Lab 02/20/16 0419  NA 139  K 4.1  CL 103  CO2 28  GLUCOSE 93  BUN 34*  CREATININE 1.67*  CALCIUM 8.6*   ------------------------------------------------------------------------------------------------------------------  Cardiac Enzymes  Recent Labs Lab 02/19/16 0808 02/19/16 1047 02/19/16 1617  TROPONINI 0.11* 0.11* 0.10*   ------------------------------------------------------------------------------------------------------------------  RADIOLOGY:  X-ray Chest Pa Or Ap  02/19/2016  CLINICAL DATA:  Post thoracentesis EXAM: CHEST 1 VIEW COMPARISON:  02/19/2016 chest radiograph. FINDINGS: Single lead left subclavian pacemaker is stable in configuration with lead tip overlying the right ventricle. Stable cardiomediastinal silhouette with mild cardiomegaly. No pneumothorax. Small residual right pleural effusion, decreased. Stable small left pleural effusion. No overt pulmonary edema. Bibasilar atelectasis, decreased on the right and stable on the left. IMPRESSION: 1. No pneumothorax. 2. Residual small right pleural effusion, decreased. 3. Stable small left pleural effusion. 4. Bibasilar atelectasis, decreased on the right and stable on the left. 5. Stable mild cardiomegaly without overt pulmonary edema. Electronically Signed   By: Ilona Sorrel M.D.   On: 02/19/2016 17:21   Dg Chest 2 View  02/19/2016  CLINICAL DATA:  Shortness of breath and chest pain. EXAM: CHEST  2 VIEW COMPARISON:  02/06/2016 FINDINGS: Increased densities in the right lower chest are suggestive for re-accumulation of right pleural fluid. Probable subsegmental atelectasis  in the right lung. Question a  small left pleural effusion. Heart size is grossly stable. There is a single lead cardiac pacemaker. IMPRESSION: Increased densities in the right lower chest. Findings are most compatible with a moderate sized right pleural effusion and associated atelectasis. Electronically Signed   By: Markus Daft M.D.   On: 02/19/2016 08:40   Ct Angio Chest Pe W/cm &/or Wo Cm  02/19/2016  CLINICAL DATA:  Shortness of breath for 4 days. Recent history of pulmonary embolism and right-sided thoracentesis. No history of malignancy. EXAM: CT ANGIOGRAPHY CHEST WITH CONTRAST TECHNIQUE: Multidetector CT imaging of the chest was performed using the standard protocol during bolus administration of intravenous contrast. Multiplanar CT image reconstructions and MIPs were obtained to evaluate the vascular anatomy. CONTRAST:  56mL OMNIPAQUE IOHEXOL 350 MG/ML SOLN COMPARISON:  Radiographs 02/19/2016.  CT 11/18/2015. FINDINGS: Mediastinum: The pulmonary arteries are well opacified with contrast. No evidence of recurrent pulmonary embolism. There is atherosclerosis of the aorta, great vessels and coronary arteries.The heart size is normal. There is no pericardial effusion. The left subclavian pacemaker appears unchanged. There are no enlarged mediastinal, hilar or axillary lymph nodes. The thyroid gland, trachea and esophagus demonstrate no significant findings. Lungs/Pleura: Right-sided pleural effusion has enlarged and is now large. A small dependent left-sided pleural effusion has not significantly changed. There are worsening dependent airspace opacities within the right lower and middle lobes, probably due to atelectasis. There is stable left lower lobe atelectasis.No evidence of endobronchial lesion or pneumothorax. Upper abdomen: No acute findings are seen within the visualized upper abdomen. Mild contour irregularity of the liver suggests cirrhosis. The adrenal glands appear unchanged. There are diffuse  vascular calcifications. Musculoskeletal/Chest wall: No chest wall lesion or acute osseous findings.Thoracic spine degenerative changes and an old left-sided rib fracture are unchanged. Review of the MIP images confirms the above findings. IMPRESSION: 1. No evidence of recurrent acute pulmonary embolism. 2. Enlarging right pleural effusion, now large. Stable small left pleural effusion. 3. Worsening airspace opacities at the right lung base, probably atelectasis. 4. Diffuse atherosclerosis. 5. Possible cirrhosis. Electronically Signed   By: Richardean Sale M.D.   On: 02/19/2016 12:35   US Thoracentesis Asp Pleural Space W/img Guide  02/19/2016  INDICATION: Large right pleural effusion, CHF, shortness of breath EXAM: ULTRASOUND GUIDED RIGHT THORACENTESIS MEDICATIONS: 1% lidocaine locally COMPLICATIONS: None immediate. PROCEDURE: An ultrasound guided thoracentesis was thoroughly discussed with the patient and questions answered. The benefits, risks, alternatives and complications were also discussed. The patient understands and wishes to proceed with the procedure. Written consent was obtained. Ultrasound was performed to localize and mark an adequate pocket of fluid in the right chest. Images obtained for documentation. The area was then prepped and draped in the normal sterile fashion. 1% Lidocaine was used for local anesthesia. Under ultrasound guidance a 8 Fr Safe-T-Centesis catheter was introduced. Thoracentesis was performed. The catheter was removed and a dressing applied. FINDINGS: A total of approximately 2.4 L of clear pleural fluid was removed. Samples were sent to the laboratory as requested by the clinical team. IMPRESSION: Successful ultrasound guided right thoracentesis yielding 2.4 L of pleural fluid. Electronically Signed   By: Jerilynn Mages.  Shick M.D.   On: 02/19/2016 16:58     ASSESSMENT AND PLAN:   82 y/o male with CM EF 25-30%, history of pulmonary embolism, BPH, GERD who presents to the hospital  due to shortness of breath.  1 acute respiratory failure with hypoxia; This is due to significantly large right-sided pleural effusion. He is being weaned off oxygen.  He is status post thoracentesis with 2-1/2 fluid removal. Fluid is consistent with transudative process. Pulmonary and cardiology consultation appreciated.   2 right-sided pleural effusion-This is recurrent in nature. This is related to underlying CHF and cardiomyopathy as per results of thoracentesis.   3 Elevated troponin: This is due to demand ischemia and not ACS.   4 CHF: acute on chronic systolic dysfunction, ejection fraction 20-25%..  patient would like to establish care with another cardiology's. I consulted cardiology, lobe our group. He was diuresed with IV Lasix. He will resume his outpatient medications including Lasix and ACE inhibitor. He was started on low-dose Coreg which she needs to continue.  Continue low-sodium diet.   5 BPH-Continue Hytrin  6 GERD: Continue Protonix.  7  essential HTN:  He will continue Imdur, Chlorthalidone, ACE inhibitor and Coreg. 8 hx of PE -  he will continue Apixiban.         Management plans discussed with the patient and he is in agreement.  CODE STATUS: full  TOTAL TIME TAKING CARE OF THIS PATIENT: 30 minutes.     POSSIBLE D/C today, DEPENDING ON CLINICAL CONDITION.   Fain Francis M.D on 02/20/2016 at 11:13 AM  Between 7am to 6pm - Pager - [phone] After 6pm go to [email or web address] - password [email or web address] [facility]  Office  [phone]  CC: Primary care physician; Madelyn Brunner, MD  Note: This dictation was prepared with Dragon dictation along with smaller phrase technology. Any transcriptional errors that result from this process are unintentional.

## 2016-02-20 NOTE — Discharge Summary (Addendum)
[facility] [place] NAME: Donald Henry    MR#:  [id number]  DATE OF BIRTH:  1930-08-15  DATE OF ADMISSION:  02/19/2016 ADMITTING PHYSICIAN: Henreitta Leber, MD  DATE OF DISCHARGE: 02/20/2016  PRIMARY CARE PHYSICIAN: Madelyn Brunner, MD    ADMISSION DIAGNOSIS:  Pleural effusion associated with pulmonary infection [J94.8] Pleural effusion [J90] Hypoxia [R09.02] Acute on chronic combined systolic and diastolic congestive heart failure (HCC) [I50.43]  DISCHARGE DIAGNOSIS:  Active Problems:   Pleural effusion   SECONDARY DIAGNOSIS:   Past Medical History  Diagnosis Date  . Diabetes mellitus without complication ([place])   . SSS (sick sinus syndrome) (HCC)     a. s/p MDT ADSRO1 Single Lead PPM.  . Myocardial infarction [facility])     a. MI @ age 73-->conservatively managed.  Has never had a cath.  Reports multiple negative stress tests.  . Ischemic cardiomyopathy   . Chronic combined systolic (congestive) and diastolic (congestive) heart failure ([place])     a. 01/2015 Echo: EF 40%, mod MR/PR/TR;  b. 10/2015 Echo: EF 20-25%, sev MR, sev dil LA/RA, sev TR.  Marland Kitchen Hypertensive heart disease   . Pulmonary embolism ([place])     a. 10/2015 ->Eliquis.  . Normocytic anemia   . Peptic ulcer disease   . Obesity   . CKD (chronic kidney disease), stage III   . BPH (benign prostatic hyperplasia)   . Recurrent right pleural effusion     a. 01/28/2016 1.7L fluid removal via thoracentesis; b. 02/19/2016 2.4L removal via thoracentesis.  . Severe mitral regurgitation     a. 10/2015 Echo: sev MR.  . Hiatal hernia     HOSPITAL COURSE:   82 y/o male with CM EF 25-30%, history of pulmonary embolism, BPH, GERD who presents to the hospital due to shortness of breath.  1 acute respiratory failure with hypoxia; This is due to significantly large right-sided pleural effusion. He is being weaned off oxygen. He is status post thoracentesis with 2-1/2 fluid  removal. Fluid is consistent with transudative process. Pulmonary and cardiology consultation appreciated.   2 right-sided pleural effusion-This is recurrent in nature. This is related to underlying CHF and cardiomyopathy as per results of thoracentesis.   3 Elevated troponin: This is due to demand ischemia and not ACS.   4 CHF: acute on chronic systolic dysfunction, ejection fraction 20-25%..  patient would like to establish care with another cardiology's. I consulted cardiology, lobe our group. He was diuresed with IV Lasix. He will resume his outpatient medications including Lasix and ACE inhibitor. He was started on low-dose Coreg which she needs to continue.  Continue low-sodium diet.   5 BPH-Continue Hytrin  6 GERD: Continue Protonix.  7  essential HTN:  He will continue Imdur, Chlorthalidone, ACE inhibitor and Coreg. 8 hx of PE -  he will continue Apixiban.    DISCHARGE CONDITIONS AND DIET:   Stable on a heart healthy/low-sodium diet   CONSULTS OBTAINED:  Treatment Team:  Wellington Hampshire, MD  DRUG ALLERGIES:  No Known Allergies  DISCHARGE MEDICATIONS:   Current Discharge Medication List    START taking these medications   Details  atorvastatin (LIPITOR) 40 MG tablet Take 1 tablet (40 mg total) by mouth daily. Qty: 30 tablet, Refills: 0    carvedilol (COREG) 3.125 MG tablet Take 1 tablet (3.125 mg total) by mouth 2 (two) times daily with a meal. Qty: 60 tablet, Refills: 0  CONTINUE these medications which have CHANGED   Details  apixaban (ELIQUIS) 2.5 MG TABS tablet Take 1 tablet (2.5 mg total) by mouth 2 (two) times daily. Qty: 60 tablet, Refills: 0      CONTINUE these medications which have NOT CHANGED   Details  chlorthalidone (HYGROTON) 25 MG tablet Take 25 mg by mouth daily.    furosemide (LASIX) 20 MG tablet Take 20 mg by mouth daily.     glipiZIDE (GLUCOTROL) 10 MG tablet Take 10 mg by mouth daily.     isosorbide mononitrate (IMDUR) 30  MG 24 hr tablet Take 30 mg by mouth daily.    omeprazole (PRILOSEC) 20 MG capsule Take 20 mg by mouth daily.    terazosin (HYTRIN) 2 MG capsule Take 2 mg by mouth at bedtime.    traMADol (ULTRAM) 50 MG tablet Take 1 tablet (50 mg total) by mouth every 6 (six) hours as needed. Qty: 20 tablet, Refills: 0    vitamin B-12 (CYANOCOBALAMIN) 1000 MCG tablet Take 1,000 mcg by mouth daily.    zolpidem (AMBIEN) 5 MG tablet Take 5 mg by mouth at bedtime as needed for sleep.              Today   CHIEF COMPLAINT:  Patient is doing well this morning. He significantly improved after thoracentesis. Denies any chest pain shortness of breath.   VITAL SIGNS:  Blood pressure 119/57, pulse 60, temperature 98.1 F (36.7 C), temperature source Oral, resp. rate 20, height 5\' 7"  (1.702 m), weight 76.295 kg (168 lb 3.2 oz), SpO2 95 %.   REVIEW OF SYSTEMS:  Review of Systems  Constitutional: Negative for fever, chills and malaise/fatigue.  HENT: Negative for sore throat.   Eyes: Negative for blurred vision.  Respiratory: Negative for cough, hemoptysis, shortness of breath and wheezing.   Cardiovascular: Negative for chest pain, palpitations and leg swelling.  Gastrointestinal: Negative for nausea, vomiting, abdominal pain, diarrhea and blood in stool.  Genitourinary: Negative for dysuria.  Musculoskeletal: Negative for back pain.  Neurological: Negative for dizziness, tremors and headaches.  Endo/Heme/Allergies: Does not bruise/bleed easily.  Psychiatric/Behavioral: Negative for depression and memory loss.     PHYSICAL EXAMINATION:  GENERAL:  82 y.o.-year-old patient lying in the bed with no acute distress.  NECK:  Supple, no jugular venous distention. No thyroid enlargement, no tenderness.  LUNGS: Normal breath sounds bilaterally, no wheezing, rales,rhonchi  No use of accessory muscles of respiration.  CARDIOVASCULAR: S1, S2 normal. 2/^ SEM NO rubs, or gallops.  ABDOMEN: Soft,  non-tender, non-distended. Bowel sounds present. No organomegaly or mass.  EXTREMITIES: No pedal edema, cyanosis, or clubbing.  PSYCHIATRIC: The patient is alert and oriented x 3.  SKIN: No obvious rash, lesion, or ulcer.   DATA REVIEW:   CBC  Recent Labs Lab 02/20/16 0419  WBC 7.5  HGB 9.5*  HCT 27.8*  PLT 182    Chemistries   Recent Labs Lab 02/20/16 0419  NA 139  K 4.1  CL 103  CO2 28  GLUCOSE 93  BUN 34*  CREATININE 1.67*  CALCIUM 8.6*    Cardiac Enzymes  Recent Labs Lab 02/19/16 0808 02/19/16 1047 02/19/16 1617  TROPONINI 0.11* 0.11* 0.10*    Microbiology Results  @[email or web address] @  RADIOLOGY:  X-ray Chest Pa Or Ap  02/19/2016  CLINICAL DATA:  Post thoracentesis EXAM: CHEST 1 VIEW COMPARISON:  02/19/2016 chest radiograph. FINDINGS: Single lead left subclavian pacemaker is stable in configuration with lead tip overlying the right ventricle. Stable cardiomediastinal  silhouette with mild cardiomegaly. No pneumothorax. Small residual right pleural effusion, decreased. Stable small left pleural effusion. No overt pulmonary edema. Bibasilar atelectasis, decreased on the right and stable on the left. IMPRESSION: 1. No pneumothorax. 2. Residual small right pleural effusion, decreased. 3. Stable small left pleural effusion. 4. Bibasilar atelectasis, decreased on the right and stable on the left. 5. Stable mild cardiomegaly without overt pulmonary edema. Electronically Signed   By: Ilona Sorrel M.D.   On: 02/19/2016 17:21   Dg Chest 2 View  02/19/2016  CLINICAL DATA:  Shortness of breath and chest pain. EXAM: CHEST  2 VIEW COMPARISON:  02/06/2016 FINDINGS: Increased densities in the right lower chest are suggestive for re-accumulation of right pleural fluid. Probable subsegmental atelectasis in the right lung. Question a small left pleural effusion. Heart size is grossly stable. There is a single lead cardiac pacemaker. IMPRESSION: Increased densities in the right lower  chest. Findings are most compatible with a moderate sized right pleural effusion and associated atelectasis. Electronically Signed   By: Markus Daft M.D.   On: 02/19/2016 08:40   Ct Angio Chest Pe W/cm &/or Wo Cm  02/19/2016  CLINICAL DATA:  Shortness of breath for 4 days. Recent history of pulmonary embolism and right-sided thoracentesis. No history of malignancy. EXAM: CT ANGIOGRAPHY CHEST WITH CONTRAST TECHNIQUE: Multidetector CT imaging of the chest was performed using the standard protocol during bolus administration of intravenous contrast. Multiplanar CT image reconstructions and MIPs were obtained to evaluate the vascular anatomy. CONTRAST:  49mL OMNIPAQUE IOHEXOL 350 MG/ML SOLN COMPARISON:  Radiographs 02/19/2016.  CT 11/18/2015. FINDINGS: Mediastinum: The pulmonary arteries are well opacified with contrast. No evidence of recurrent pulmonary embolism. There is atherosclerosis of the aorta, great vessels and coronary arteries.The heart size is normal. There is no pericardial effusion. The left subclavian pacemaker appears unchanged. There are no enlarged mediastinal, hilar or axillary lymph nodes. The thyroid gland, trachea and esophagus demonstrate no significant findings. Lungs/Pleura: Right-sided pleural effusion has enlarged and is now large. A small dependent left-sided pleural effusion has not significantly changed. There are worsening dependent airspace opacities within the right lower and middle lobes, probably due to atelectasis. There is stable left lower lobe atelectasis.No evidence of endobronchial lesion or pneumothorax. Upper abdomen: No acute findings are seen within the visualized upper abdomen. Mild contour irregularity of the liver suggests cirrhosis. The adrenal glands appear unchanged. There are diffuse vascular calcifications. Musculoskeletal/Chest wall: No chest wall lesion or acute osseous findings.Thoracic spine degenerative changes and an old left-sided rib fracture are unchanged.  Review of the MIP images confirms the above findings. IMPRESSION: 1. No evidence of recurrent acute pulmonary embolism. 2. Enlarging right pleural effusion, now large. Stable small left pleural effusion. 3. Worsening airspace opacities at the right lung base, probably atelectasis. 4. Diffuse atherosclerosis. 5. Possible cirrhosis. Electronically Signed   By: Richardean Sale M.D.   On: 02/19/2016 12:35   US Thoracentesis Asp Pleural Space W/img Guide  02/19/2016  INDICATION: Large right pleural effusion, CHF, shortness of breath EXAM: ULTRASOUND GUIDED RIGHT THORACENTESIS MEDICATIONS: 1% lidocaine locally COMPLICATIONS: None immediate. PROCEDURE: An ultrasound guided thoracentesis was thoroughly discussed with the patient and questions answered. The benefits, risks, alternatives and complications were also discussed. The patient understands and wishes to proceed with the procedure. Written consent was obtained. Ultrasound was performed to localize and mark an adequate pocket of fluid in the right chest. Images obtained for documentation. The area was then prepped and draped in the normal sterile  fashion. 1% Lidocaine was used for local anesthesia. Under ultrasound guidance a 8 Fr Safe-T-Centesis catheter was introduced. Thoracentesis was performed. The catheter was removed and a dressing applied. FINDINGS: A total of approximately 2.4 L of clear pleural fluid was removed. Samples were sent to the laboratory as requested by the clinical team. IMPRESSION: Successful ultrasound guided right thoracentesis yielding 2.4 L of pleural fluid. Electronically Signed   By: Jerilynn Mages.  Shick M.D.   On: 02/19/2016 16:58      Management plans discussed with the patient and he is in agreement. Stable for discharge home  Patient should follow up with CARDIOLOGY 1 week  CODE STATUS:     Code Status Orders        Start     Ordered   02/19/16 1513  Full code   Continuous     02/19/16 1512    Code Status History    Date  Active Date Inactive Code Status Order ID Comments User Context   11/18/2015  8:31 PM 11/24/2015  6:46 PM Full Code [id number]  [place]le [place], MD ED    Advance Directive Documentation        Most Recent Value   Type of Advance Directive  Living will, Healthcare Power of Attorney   Pre-existing out of facility DNR order (yellow form or pink MOST form)     "MOST" Form in Place?        TOTAL TIME TAKING CARE OF THIS PATIENT: 35 minutes.    Note: This dictation was prepared with Dragon dictation along with smaller phrase technology. Any transcriptional errors that result from this process are unintentional.  [facility], Yusef Lamp M.D on 02/20/2016 at 2:00 PM  Between 7am to 6pm - Pager - [phone] After 6pm go to [email or web address] - password [email or web address] [facility]  Office  [phone]  CC: Primary care physician; Madelyn Brunner, MD

## 2016-02-20 NOTE — Progress Notes (Signed)
Removed telemetry and removed iv.  Rx called into [facility].  No questions at this time.  Escorted out of hospital via wheelchair by volunteers.

## 2016-02-20 NOTE — Progress Notes (Signed)
Waiting for cardiac and pulmonary consults in order to discharge patient.

## 2016-02-20 NOTE — Consult Note (Signed)
Cardiology Consult    Patient ID: Donald Henry MRN: [id number], DOB/AGE: 1930-02-23   Admit date: 02/19/2016 Date of Consult: 02/20/2016  Primary Physician: Madelyn Brunner, MD Primary Cardiologist: Britt Boozer, MD - wishes to establish care with . Requesting Provider: S. Modi, MD  Patient Profile    82 y/o ? with a h/o CAD, ICM, CHF, AF, and SSS s/p PPM who was admitted 2/23 2/2 dyspnea in the setting of recurrent R pleural effusion, whom we've been asked to eval.  Past Medical History   Past Medical History  Diagnosis Date  . Diabetes mellitus without complication ([place])   . SSS (sick sinus syndrome) ([facility])     a. s/p MDT ADSRO1 Single Lead PPM.  . Myocardial infarction [facility])     a. MI @ age 62-->conservatively managed.  Has never had a cath.  Reports multiple negative stress tests.  . Ischemic cardiomyopathy   . Chronic combined systolic (congestive) and diastolic (congestive) heart failure ([place])     a. 01/2015 Echo: EF 40%, mod MR/PR/TR;  b. 10/2015 Echo: EF 20-25%, sev MR, sev dil LA/RA, sev TR.  Marland Kitchen Hypertensive heart disease   . Pulmonary embolism ([place])     a. 10/2015 ->Eliquis.  . Normocytic anemia   . Peptic ulcer disease   . Obesity   . CKD (chronic kidney disease), stage III   . BPH (benign prostatic hyperplasia)   . Recurrent right pleural effusion     a. 01/28/2016 1.7L fluid removal via thoracentesis; b. 02/19/2016 2.4L removal via thoracentesis.  . Severe mitral regurgitation     a. 10/2015 Echo: sev MR.  . Hiatal hernia     History reviewed. No pertinent past surgical history.   Allergies  No Known Allergies  History of Present Illness    82 year old male with the above accomplished past medical history. He has a history of heart disease dating back approximately 30 years, at which time he says he was hospitalized at [facility] with a diagnosis of myocardial infarction. He was conservatively managed. He did not undergo cardiac  catheterization then or at any point since then. He says he has had numerous stress tests over the years, all of which have been normal. He has been followed locally by [facility] clinic for chronic atrial fibrillation and sick sinus syndrome and is status post a single lead permanent pacemaker. In early 2016, he underwent echo that showed mild to moderate LV dysfunction with an EF of 40% and moderate mitral, tricuspid, and pulmonic regurgitation. In November 2016, he was again seen by cardiology secondary to progressive dyspnea. An echocardiogram showed an EF of 20-25% with severe mitral regurgitation. He was also scheduled for stress testing. This was never performed, as he subsequently was admitted to [facility] and diagnosed with scattered bilateral central and segmental pulmonary emboli with CT evidence of right heart strain. He was also noted to have bilateral pleural effusions coronary atherosclerosis. He was started on eliquis therapy and has been on it ever since. The stress test that was scheduled prior to the diagnosis of pulmonary embolism was canceled and he says never readdressed.  He was doing well until about 10-14 days ago, when he began to experience progressive dyspnea on exertion. He was evaluated at [facility] found to have a large right pleural effusion. He underwent ultrasound-guided thoracentesis with removal of 1.7 L. Following this, he felt significantly better but a week later, he began to express recurrent dyspnea on exertion and orthopnea.  He says he noted mild lower extremity edema. He does not weigh himself at home. He denies chest pain. He presented back to the emergency department on November 23 chest x-ray showed large right pleural effusion. He was admitted pulmonology was consulted. He underwent repeat ultrasound guided thoracentesis with removal of 2.4 L. He was also treated with IV Lasix with good diuresis and 5 pound weight loss. Lab findings are suggestive of a transudate  with effusion.He has had significant clinical improvement. He is eager for discharge. We have been asked to evaluate as he wishes to transfer his care to our practice.  Inpatient Medications    . apixaban  5 mg Oral BID  . carvedilol  3.125 mg Oral BID WC  . chlorthalidone  25 mg Oral Daily  . furosemide  40 mg Intravenous Q12H  . glipiZIDE  10 mg Oral QAC breakfast  . isosorbide mononitrate  30 mg Oral Daily  . pantoprazole  40 mg Oral Daily  . terazosin  2 mg Oral QHS  . vitamin B-12  1,000 mcg Oral Daily    Family History    Family History  Problem Relation Age of Onset  . Heart disease Mother   . Heart disease Father     a. First MI @ 47.  . Leukemia Sister   . Diabetes Mellitus II Sister   . Diabetes Mellitus II Brother   . Heart attack Son     a. MI @ 29  . Heart attack Son     a. MI @ 60.    Social History    Social History   Social History  . Marital Status: Married    Spouse Name: N/A  . Number of Children: N/A  . Years of Education: N/A   Occupational History  . Not on file.   Social History Main Topics  . Smoking status: Never Smoker   . Smokeless tobacco: Not on file  . Alcohol Use: No  . Drug Use: Not on file  . Sexual Activity: Not on file   Other Topics Concern  . Not on file   Social History Narrative     Review of Systems    General:  No chills, fever, night sweats or weight changes.  Cardiovascular:  No chest pain, +++ dyspnea on exertion, +++ occas LE edema, +++ orthopnea prior to admission, no palpitations, paroxysmal nocturnal dyspnea. Dermatological: No rash, lesions/masses Respiratory: No cough, +++ dyspnea Urologic: No hematuria, dysuria Abdominal:   No nausea, vomiting, diarrhea, bright red blood per rectum, melena, or hematemesis Neurologic:  No visual changes, wkns, changes in mental status. All other systems reviewed and are otherwise negative except as noted above.  Physical Exam    Blood pressure 119/57, pulse 60,  temperature 98.1 F (36.7 C), temperature source Oral, resp. rate 20, height 5\' 7"  (1.702 m), weight 168 lb 3.2 oz (76.295 kg), SpO2 95 %.  General: Pleasant, NAD Psych: Normal affect. Neuro: Alert and oriented X 3. Moves all extremities spontaneously. HEENT: Normal  Neck: Supple without bruits.  JVP ~ 10 cm. Lungs:  Resp regular and unlabored, bibasilar crackles. Heart: RRR no s3, s4, 1/6 syst murmur @ apex. Abdomen: Soft, non-tender, non-distended, BS + x 4.  Extremities: No clubbing, cyanosis or edema. DP/PT/Radials 2+ and equal bilaterally.  Labs     Recent Labs  02/19/16 0808 02/19/16 1047 02/19/16 1617  TROPONINI 0.11* 0.11* 0.10*   Lab Results  Component Value Date   WBC 7.5 02/20/2016  HGB 9.5* 02/20/2016   HCT 27.8* 02/20/2016   MCV 92.0 02/20/2016   PLT 182 02/20/2016     Recent Labs Lab 02/20/16 0419  NA 139  K 4.1  CL 103  CO2 28  BUN 34*  CREATININE 1.67*  CALCIUM 8.6*  GLUCOSE 93    Radiology Studies    X-ray Chest Pa Or Ap  Mar 03, 2016  CLINICAL DATA:  Post thoracentesis EXAM: CHEST 1 VIEW COMPARISON:  March 03, 2016 chest radiograph. FINDINGS: Single lead left subclavian pacemaker is stable in configuration with lead tip overlying the right ventricle. Stable cardiomediastinal silhouette with mild cardiomegaly. No pneumothorax. Small residual right pleural effusion, decreased. Stable small left pleural effusion. No overt pulmonary edema. Bibasilar atelectasis, decreased on the right and stable on the left. IMPRESSION: 1. No pneumothorax. 2. Residual small right pleural effusion, decreased. 3. Stable small left pleural effusion. 4. Bibasilar atelectasis, decreased on the right and stable on the left. 5. Stable mild cardiomegaly without overt pulmonary edema. Electronically Signed   By: Ilona Sorrel M.D.   On: 03-03-2016 17:21   Dg Chest 2 View  March 03, 2016  CLINICAL DATA:  Shortness of breath and chest pain. EXAM: CHEST  2 VIEW COMPARISON:  02/06/2016  FINDINGS: Increased densities in the right lower chest are suggestive for re-accumulation of right pleural fluid. Probable subsegmental atelectasis in the right lung. Question a small left pleural effusion. Heart size is grossly stable. There is a single lead cardiac pacemaker. IMPRESSION: Increased densities in the right lower chest. Findings are most compatible with a moderate sized right pleural effusion and associated atelectasis. Electronically Signed   By: Markus Daft M.D.   On: 2016-03-03 08:40   Dg Chest 2 View  02/06/2016  CLINICAL DATA:  Status post right thoracentesis EXAM: CHEST  2 VIEW COMPARISON:  01/29/2016 FINDINGS: Cardiac shadow is within normal limits. A pacing device is noted. The previously seen right-sided pleural effusion is now almost completely resolved. A tiny amount of residual fluid is noted. No pneumothorax is seen. The left lung is clear. No bony abnormality is noted. IMPRESSION: No evidence of post thoracentesis pneumothorax. Patient is scheduled for discharge to home. Electronically Signed   By: Inez Catalina M.D.   On: 02/06/2016 12:05   Ct Angio Chest Pe W/cm &/or Wo Cm  03/03/2016  CLINICAL DATA:  Shortness of breath for 4 days. Recent history of pulmonary embolism and right-sided thoracentesis. No history of malignancy. EXAM: CT ANGIOGRAPHY CHEST WITH CONTRAST TECHNIQUE: Multidetector CT imaging of the chest was performed using the standard protocol during bolus administration of intravenous contrast. Multiplanar CT image reconstructions and MIPs were obtained to evaluate the vascular anatomy. CONTRAST:  15mL OMNIPAQUE IOHEXOL 350 MG/ML SOLN COMPARISON:  Radiographs 03-03-16.  CT 11/18/2015. FINDINGS: Mediastinum: The pulmonary arteries are well opacified with contrast. No evidence of recurrent pulmonary embolism. There is atherosclerosis of the aorta, great vessels and coronary arteries.The heart size is normal. There is no pericardial effusion. The left subclavian  pacemaker appears unchanged. There are no enlarged mediastinal, hilar or axillary lymph nodes. The thyroid gland, trachea and esophagus demonstrate no significant findings. Lungs/Pleura: Right-sided pleural effusion has enlarged and is now large. A small dependent left-sided pleural effusion has not significantly changed. There are worsening dependent airspace opacities within the right lower and middle lobes, probably due to atelectasis. There is stable left lower lobe atelectasis.No evidence of endobronchial lesion or pneumothorax. Upper abdomen: No acute findings are seen within the visualized upper abdomen. Mild contour irregularity of  the liver suggests cirrhosis. The adrenal glands appear unchanged. There are diffuse vascular calcifications. Musculoskeletal/Chest wall: No chest wall lesion or acute osseous findings.Thoracic spine degenerative changes and an old left-sided rib fracture are unchanged. Review of the MIP images confirms the above findings. IMPRESSION: 1. No evidence of recurrent acute pulmonary embolism. 2. Enlarging right pleural effusion, now large. Stable small left pleural effusion. 3. Worsening airspace opacities at the right lung base, probably atelectasis. 4. Diffuse atherosclerosis. 5. Possible cirrhosis. Electronically Signed   By: Richardean Sale M.D.   On: 02/19/2016 12:35   US Thoracentesis Asp Pleural Space W/img Guide  02/19/2016  INDICATION: Large right pleural effusion, CHF, shortness of breath EXAM: ULTRASOUND GUIDED RIGHT THORACENTESIS MEDICATIONS: 1% lidocaine locally COMPLICATIONS: None immediate. PROCEDURE: An ultrasound guided thoracentesis was thoroughly discussed with the patient and questions answered. The benefits, risks, alternatives and complications were also discussed. The patient understands and wishes to proceed with the procedure. Written consent was obtained. Ultrasound was performed to localize and mark an adequate pocket of fluid in the right chest. Images  obtained for documentation. The area was then prepped and draped in the normal sterile fashion. 1% Lidocaine was used for local anesthesia. Under ultrasound guidance a 8 Fr Safe-T-Centesis catheter was introduced. Thoracentesis was performed. The catheter was removed and a dressing applied. FINDINGS: A total of approximately 2.4 L of clear pleural fluid was removed. Samples were sent to the laboratory as requested by the clinical team. IMPRESSION: Successful ultrasound guided right thoracentesis yielding 2.4 L of pleural fluid. Electronically Signed   By: Jerilynn Mages.  Shick M.D.   On: 02/19/2016 16:58   US Thoracentesis Asp Pleural Space W/img Guide  02/06/2016  INDICATION: Right-sided pleural effusion EXAM: ULTRASOUND GUIDED THORACENTESIS MEDICATIONS: None. ANESTHESIA/SEDATION: None COMPLICATIONS: None immediate. PROCEDURE: An ultrasound guided thoracentesis was thoroughly discussed with the patient and questions answered. The benefits, risks, alternatives and complications were also discussed. The patient understands and wishes to proceed with the procedure. Written consent was obtained. Ultrasound was performed to localize and mark an adequate pocket of fluid in the right chest. The area was then prepped and draped in the normal sterile fashion. 1% Lidocaine was used for local anesthesia. Under ultrasound guidance a 6 French Safe-T-Centesis catheter was introduced. Thoracentesis was performed. The catheter was removed and a dressing applied. FINDINGS: A total of approximately 1.7 L of clear yellow fluid was removed. A fluid sample was sent for laboratory analysis. IMPRESSION: Successful ultrasound guided right thoracentesis yielding 1.7 L of pleural fluid. Electronically Signed   By: Inez Catalina M.D.   On: 02/06/2016 12:23    ECG & Cardiac Imaging    V paced, 64, underlying AFib.  Assessment & Plan    1.  Acute on chronic combined systolic and diastolic CHF/ICM:  Minus [id number] diuresis this admission with  reduction in weight from 173 lbs on admission to 168 lbs this morning.  Large right, recurrent, transudative pleural effusion also contributing to dyspnea, now s/p thoracentesis on 2/1 (1.7L) and again on 2/23 (2.4L).  Volume status appears stable.  He does have crackles on exam though this may represent atx (seen on f/u cxr).  JVP is not significantly elevated.  Cont bb and po lasix.  Not on acei/arb/spiro - presumably 2/2 CKD with elevated creat though ultimately, he would benefit from addition of acei.  Could consider either acei or low dose hydral + nitrate as outpt (currently on imdur).  As LV dysfxn was noted to be worse in Nov (  20-25%) then Feb (40%) of 2016, he should have a non-invasive ischemic eval @ some point as an outpt.  He has f/u with Dr. Yvone Neu scheduled for 3/8.  He should also be routed into the [facility].  2.  Recurrent R Pleural Effusion:  Seen by pulmonology.  Transudative and most likely 2/2 CHF.  S/p thoracentesis as above.  Cont PO lasix.  Mgmt of CHF will be important in preventing recurrence.  We discussed the importance of daily weights, sodium restriction, medication compliance, and symptom reporting and he verbalizes understanding.   3.  H/O MI:  He had MI at age 27.  He's never had a cath but reports several nl nuc studies over the year.  CTA in 10/2015 did show coronary atherosclerosis.  He was having c/p and dyspnea in Nov and was scheduled for MV but it was cancelled after admission for PE.  Notably, LV dysfxn was noted on echo however.  He has not had c/p recently.  He will need outpt ischemic eval @ some point.  Cont bb.  No asa 2/2 on eliquis.  He was previously on simvastatin and said he tolerated it but it was d/c'd for unknown reasons.  Would resume.  4.  Hypertensive Heart Disease:  Stable.  5.  Chronic Atrial Fibrillation:  On eliqus and bb.  Asymptomatic. CHA2DS2VASc = 5.  His creat spends more time above 1.6 then under it.  In that setting, with age > 45, I  will drop his eliquis dose to 2.5 mg BID.  6.  SSS: s/p MDT PPM.  Last interrogated in December 2016.  Per note in care everywhere "normal pacemaker function. Battery at ERI/RRT. F/U 6 months."  As he wishes to tx care to our practice, we will need to arrange for f/u with Dr. Caryl Comes soon.  7.  HL:  Cont statin.  LDL 86 in 03/2015.  8.  CKD III:  Creat relatively stable throughout admission.  As above, reduce eliquis dose.  9.  Elevated Troponin:  Mild elevation with flat trend.  Likely represents demand ischemia in the setting of CHF. In light of new/worsened LV dysfxn noted on echo in 10/2015, he will need ischemic eval as an outpt.    10.  Severe Mitral Regurgitation:  Noted on echo 10/2015.  Only a soft murmur on exam.  ? Role in CHF at this point.  May need TEE at some point to better evaluate.  In light of LV dysfxn, he is not an ideal surgical candidate.  Signed, Murray Hodgkins, NP 02/20/2016, 1:25 PM

## 2016-02-23 LAB — BODY FLUID CULTURE: Culture: NO GROWTH

## 2016-02-23 LAB — CYTOLOGY - NON PAP

## 2016-03-03 ENCOUNTER — Encounter: Payer: Self-pay | Admitting: Cardiology

## 2016-03-03 ENCOUNTER — Ambulatory Visit (INDEPENDENT_AMBULATORY_CARE_PROVIDER_SITE_OTHER): Payer: Medicare Other | Admitting: Cardiology

## 2016-03-03 VITALS — BP 130/70 | HR 74 | Ht 67.0 in | Wt 167.5 lb

## 2016-03-03 DIAGNOSIS — I10 Essential (primary) hypertension: Secondary | ICD-10-CM

## 2016-03-03 DIAGNOSIS — I482 Chronic atrial fibrillation: Secondary | ICD-10-CM

## 2016-03-03 DIAGNOSIS — I5023 Acute on chronic systolic (congestive) heart failure: Secondary | ICD-10-CM

## 2016-03-03 DIAGNOSIS — I4891 Unspecified atrial fibrillation: Secondary | ICD-10-CM | POA: Diagnosis not present

## 2016-03-03 DIAGNOSIS — E785 Hyperlipidemia, unspecified: Secondary | ICD-10-CM

## 2016-03-03 DIAGNOSIS — I340 Nonrheumatic mitral (valve) insufficiency: Secondary | ICD-10-CM

## 2016-03-03 DIAGNOSIS — I429 Cardiomyopathy, unspecified: Secondary | ICD-10-CM | POA: Diagnosis not present

## 2016-03-03 DIAGNOSIS — I495 Sick sinus syndrome: Secondary | ICD-10-CM

## 2016-03-03 DIAGNOSIS — I4821 Permanent atrial fibrillation: Secondary | ICD-10-CM

## 2016-03-03 NOTE — Progress Notes (Signed)
Cardiology Office Note   Date:  03/03/2016   ID:  Donald Henry, DOB 1930/12/22, MRN [id number]  Referring Doctor:  Madelyn Brunner, MD   Cardiologist:   Wende Bushy, MD   Reason for consultation:  Chief Complaint  Patient presents with  . Other    Former Donald Henry pt is Est. Care today pt does have Medtronic Pacemaker F/u hospital CHF and pleural effusion. Meds reviewed verbally with pt.      History of Present Illness: Donald Henry is a 82 y.o. male who presents for History of atrial fibrillation, coronary artery disease. Patient would like to establish care with [facility] heart care.  In February 2017, patient was admitted to the hospital for progressive shortness of breath. He was found to have a large right pleural effusion. He underwent thoracentesis and 1.7 L was removed. His shortness of breath improved, however after a week, there was recurrence of the same symptoms. He presented to the ER and was again found to have large right pleural effusion. Thoracentesis was done again and 2.4 L of fluid was removed.   Since that admission, patient reports to be doing well. He is religious with taking his medications. He denies chest pain or chest tightness. He does not report any significant shortness of breath with activities of daily living. He knows not to push himself. He is independent at home. He cares for himself but gets a lot of help from his son.  In terms of coronary artery disease, patient reports having a heart attack in his 43s. That was medically managed. He did not have any stenting or bypass done.  In terms of atrial fibrillation, he is aware of the diagnosis but he does not recall having any issues with the irregular heartbeat. He denies having any symptoms of palpitations or feeling of the change in rhythm. He says that he was stared on Eliquis sometime in November when he was diagnosed to have bilateral pulmonary emboli. He does not  recall being on warfarin prior to that.  In terms of the pacemaker, he recalls that placed 2 years ago.   ROS:  Please see the history of present illness. Aside from mentioned under HPI, all other systems are reviewed and negative.     Past Medical History  Diagnosis Date  . Diabetes mellitus without complication ([place])   . SSS (sick sinus syndrome) (H[facility])     a. s/p MDT ADSRO1 Single Lead PPM.  . Myocardial infarction [facility])     a. MI @ age 42-->conservatively managed.  Has never had a cath.  Reports multiple negative stress tests.  . Ischemic cardiomyopathy   . Chronic combined systolic (congestive) and diastolic (congestive) heart failure ([place])     a. 01/2015 Echo: EF 40%, mod MR/PR/TR;  b. 10/2015 Echo: EF 20-25%, sev MR, sev dil LA/RA, sev TR.  Marland Kitchen Hypertensive heart disease   . Pulmonary embolism ([place])     a. 10/2015 ->Eliquis.  . Normocytic anemia   . Peptic ulcer disease   . Obesity   . CKD (chronic kidney disease), stage III   . BPH (benign prostatic hyperplasia)   . Recurrent right pleural effusion     a. 01/28/2016 1.7L fluid removal via thoracentesis; b. 02/19/2016 2.4L removal via thoracentesis.  . Severe mitral regurgitation     a. 10/2015 Echo: sev MR.  . Hiatal hernia     Past Surgical History  Procedure Laterality Date  . Insert /  replace / remove pacemaker      Medtronic  Serial # [id number]     reports that he has never smoked. He does not have any smokeless tobacco history on file. He reports that he does not drink alcohol or use illicit drugs.   family history includes Diabetes Mellitus II in his brother and sister; Heart attack in his son and son; Heart disease in his father and mother; Leukemia in his sister.   Current Outpatient Prescriptions  Medication Sig Dispense Refill  . apixaban (ELIQUIS) 2.5 MG TABS tablet Take 1 tablet (2.5 mg total) by mouth 2 (two) times daily. 60 tablet 0  . atorvastatin (LIPITOR) 40 MG tablet Take 1 tablet (40  mg total) by mouth daily. 30 tablet 0  . carvedilol (COREG) 3.125 MG tablet Take 1 tablet (3.125 mg total) by mouth 2 (two) times daily with a meal. 60 tablet 0  . chlorthalidone (HYGROTON) 25 MG tablet Take 25 mg by mouth daily.    . furosemide (LASIX) 20 MG tablet Take 20 mg by mouth daily.     Marland Kitchen glipiZIDE (GLUCOTROL) 10 MG tablet Take 10 mg by mouth daily.     . isosorbide mononitrate (IMDUR) 30 MG 24 hr tablet Take 30 mg by mouth daily.    Marland Kitchen omeprazole (PRILOSEC) 20 MG capsule Take 20 mg by mouth daily.    Marland Kitchen terazosin (HYTRIN) 2 MG capsule Take 2 mg by mouth at bedtime.    . traMADol (ULTRAM) 50 MG tablet Take 1 tablet (50 mg total) by mouth every 6 (six) hours as needed. (Patient taking differently: Take 50 mg by mouth every 6 (six) hours as needed for moderate pain. ) 20 tablet 0  . vitamin B-12 (CYANOCOBALAMIN) 1000 MCG tablet Take 1,000 mcg by mouth daily.    Marland Kitchen zolpidem (AMBIEN) 5 MG tablet Take 5 mg by mouth at bedtime as needed for sleep.     No current facility-administered medications for this visit.    Allergies: Aspirin    PHYSICAL EXAM: VS:  BP 130/70 mmHg  Pulse 74  Ht 5\' 7"  (1.702 m)  Wt 167 lb 8 oz (75.978 kg)  BMI 26.23 kg/m2 , Body mass index is 26.23 kg/(m^2). Wt Readings from Last 3 Encounters:  03/03/16 167 lb 8 oz (75.978 kg)  02/20/16 168 lb 3.2 oz (76.295 kg)  02/06/16 172 lb (78.019 kg)    GENERAL:  well developed, well nourished, not in acute distress HEENT: normocephalic, pink conjunctivae, anicteric sclerae, no xanthelasma, normal dentition, oropharynx clear NECK: Mild neck vein engorgement, (+) hepatojugular reflux, carotid upstroke brisk and symmetric, no bruit, no thyromegaly, no lymphadenopathy LUNGS:  good respiratory effort, clear to auscultation bilaterally CV:  PMI laterally displaced, no thrills, no lifts, S2 within normal limits, no palpable S3 or S4, MR murmur soft, no rubs, no gallops ABD:  Soft, nontender, nondistended, normoactive bowel  sounds, no abdominal aortic bruit, possible hepatomegaly MS: nontender back, no kyphosis, no scoliosis, no joint deformities EXT:  2+ DP/PT pulses, + edema, no varicosities, no cyanosis, no clubbing SKIN: warm, nondiaphoretic, normal turgor, no ulcers NEUROPSYCH: alert, oriented to person, place, and time, sensory/motor grossly intact, normal mood, appropriate affect  Recent Labs: 11/18/2015: ALT 21 02/19/2016: B Natriuretic Peptide 1023.0* 02/20/2016: BUN 34*; Creatinine, Ser 1.67*; Hemoglobin 9.5*; Platelets 182; Potassium 4.1; Sodium 139; TSH 1.195   Lipid Panel No results found for: CHOL, TRIG, HDL, CHOLHDL, VLDL, LDLCALC, LDLDIRECT   Other studies Reviewed:  EKG:  EKG is  ordered today. The ekg ordered today was personally reviewed by me and it reveals electronic ventricular pacemaker, ventricular rate 69 BPM. Underlying rhythm is atrial fibrillation.  Additional studies/ records that were reviewed personally reviewed by me today include:  Echo: 11/19/2015, and LV cavity size severely dilated. EF 20-25%. Severe MR. LA severely dilated. RV moderately dilated RA severely dilated. Tricuspid valve severe regurgitation.  ASSESSMENT AND PLAN:  1.Congestive heart failure, systolic dysfunction, mild acute exacerbation on top of chronic 2.History of coronary artery disease status post MI 3.Cardiomyopathy- possibly ischemic cardiomyopathy, versus tachycardia induced/atrial fibrillation  Mild volume overload. Recommend Lasix 20 mg twice a day now. Recheck BMP in 1 week. Daily weights. Sodium restriction. Recommend to repeat echocardiogram. Last echocardiogram was done in the setting of pulmonary embolism.  Continue with current dose of carvedilol, will consider increasing on follow-up if ejection fraction remains low. Will evaluate for entresto on ffup.   Continue statin therapy. LDL goal is less than 70. If systolic dysfunction persists, we discussed the need for stress testing to evaluate  for ischemia as a cause of cardiomyopathy. Also, he will be eventually sent to CHF clinic  4. Likely permanent atrial fibrillation It appears from history, patient is not aware of the arrhythmia at all. Biatrial enlargement is severe. Question the utility of cardioversion. However patient may need to be evaluated for this if EF remains low. Patient will be seeing EP service. He has been on beta blocker and eliquis. Dosed at 2.5 mg twice a day due to age more than 65 and creatinine more than 1.5. Instructed patient to take close to 12 hours apart.  5. SSS: s/p MDT PPM. Last interrogated in December  2016. Per note in care everywhere "normal pacemaker function. Battery at ERI/RRT. F/U 6 months."We will arrange for f/u with Dr. Caryl Comes soon.   6.Severe Mitral Regurgitation/TR We'll reevaluate a repeat echo. Likely related to dilated cardiopathy.   7. Hypertension  BP is well controlled. Continue monitoring BP. Continue current medical therapy and lifestyle changes.   8. Hyperlipidemia. Cont statin. LDL 86 in 03/2015. Likely repeat soon   Current medicines are reviewed at length with the patient today.  The patient does not have concerns regarding medicines.  Labs/ tests ordered today include:  Orders Placed This Encounter  Procedures  . Basic Metabolic Panel (BMET)  . Ambulatory referral to Cardiac Electrophysiology  . EKG 12-Lead  . Echocardiogram    I had a lengthy and detailed discussion with the patient regarding diagnoses, prognosis, diagnostic options, treatment options, and side effects of medications.   I counseled the patient on importance of lifestyle modification including heart healthy diet, regular physical activity.  I spent at least 60 minutes with the patient today and more than 50% of the time was spent counseling the patient and coordinating care.   Disposition:   FU with undersigned in 2 weeks   Signed, Wende Bushy, MD  03/03/2016 5:16 PM    [place]

## 2016-03-03 NOTE — Patient Instructions (Addendum)
Medication Instructions:  Your physician has recommended you make the following change in your medication: Increase lasix to 20 mg twice daily.   Labwork: Your physician recommends that you return for lab work in: BMP in 1 week.   Date & Time:______________________________________________________   Testing/Procedures: Your physician has requested that you have an echocardiogram. Echocardiography is a painless test that uses sound waves to create images of your heart. It provides your doctor with information about the size and shape of your heart and how well your heart's chambers and valves are working. This procedure takes approximately one hour. There are no restrictions for this procedure.  Date & Time: ______________________________________________________  Follow-Up: Your physician recommends that you schedule a follow-up appointment in: 2 weeks with Dr. Yvone Neu  Date & Time:_______________________________________________________   Your physician recommends that you schedule an appointment to have your pacemaker checked.  Date & Time: ______________________________________________________   Any Other Special Instructions Will Be Listed Below (If Applicable).     If you need a refill on your cardiac medications before your next appointment, please call your pharmacy.

## 2016-03-08 LAB — PH, BODY FLUID: pH, Body Fluid: 7.9

## 2016-03-10 ENCOUNTER — Other Ambulatory Visit (INDEPENDENT_AMBULATORY_CARE_PROVIDER_SITE_OTHER): Payer: Medicare Other

## 2016-03-10 DIAGNOSIS — I5023 Acute on chronic systolic (congestive) heart failure: Secondary | ICD-10-CM | POA: Diagnosis not present

## 2016-03-11 LAB — BASIC METABOLIC PANEL
BUN / CREAT RATIO: 25 — AB (ref 10–22)
BUN: 39 mg/dL — ABNORMAL HIGH (ref 8–27)
CO2: 28 mmol/L (ref 18–29)
CREATININE: 1.57 mg/dL — AB (ref 0.76–1.27)
Calcium: 9.3 mg/dL (ref 8.6–10.2)
Chloride: 92 mmol/L — ABNORMAL LOW (ref 96–106)
GFR calc Af Amer: 46 mL/min/{1.73_m2} — ABNORMAL LOW (ref >59)
GFR, EST NON AFRICAN AMERICAN: 40 mL/min/{1.73_m2} — AB (ref >59)
GLUCOSE: 152 mg/dL — AB (ref 65–99)
Potassium: 4.8 mmol/L (ref 3.5–5.2)
SODIUM: 138 mmol/L (ref 134–144)

## 2016-03-16 ENCOUNTER — Other Ambulatory Visit: Payer: Self-pay

## 2016-03-16 ENCOUNTER — Ambulatory Visit (INDEPENDENT_AMBULATORY_CARE_PROVIDER_SITE_OTHER): Payer: Medicare Other

## 2016-03-16 DIAGNOSIS — I482 Chronic atrial fibrillation: Secondary | ICD-10-CM | POA: Diagnosis not present

## 2016-03-16 DIAGNOSIS — I5023 Acute on chronic systolic (congestive) heart failure: Secondary | ICD-10-CM | POA: Diagnosis not present

## 2016-03-16 DIAGNOSIS — I4821 Permanent atrial fibrillation: Secondary | ICD-10-CM

## 2016-03-22 ENCOUNTER — Encounter: Payer: Self-pay | Admitting: Cardiology

## 2016-03-22 ENCOUNTER — Ambulatory Visit (INDEPENDENT_AMBULATORY_CARE_PROVIDER_SITE_OTHER): Payer: Medicare Other | Admitting: Cardiology

## 2016-03-22 ENCOUNTER — Telehealth: Payer: Self-pay | Admitting: Cardiology

## 2016-03-22 VITALS — BP 130/71 | HR 86 | Ht 67.0 in | Wt 161.8 lb

## 2016-03-22 DIAGNOSIS — I10 Essential (primary) hypertension: Secondary | ICD-10-CM | POA: Diagnosis not present

## 2016-03-22 DIAGNOSIS — I4891 Unspecified atrial fibrillation: Secondary | ICD-10-CM

## 2016-03-22 DIAGNOSIS — R0602 Shortness of breath: Secondary | ICD-10-CM

## 2016-03-22 DIAGNOSIS — I2510 Atherosclerotic heart disease of native coronary artery without angina pectoris: Secondary | ICD-10-CM

## 2016-03-22 DIAGNOSIS — I495 Sick sinus syndrome: Secondary | ICD-10-CM

## 2016-03-22 DIAGNOSIS — I340 Nonrheumatic mitral (valve) insufficiency: Secondary | ICD-10-CM

## 2016-03-22 DIAGNOSIS — E785 Hyperlipidemia, unspecified: Secondary | ICD-10-CM

## 2016-03-22 DIAGNOSIS — I5023 Acute on chronic systolic (congestive) heart failure: Secondary | ICD-10-CM

## 2016-03-22 MED ORDER — CARVEDILOL 6.25 MG PO TABS: 6.2500 mg | ORAL_TABLET | Freq: Two times a day (BID) | ORAL | Status: DC

## 2016-03-22 NOTE — Progress Notes (Signed)
Cardiology Office Note   Date:  03/22/2016   ID:  ZAHRAN Henry, DOB [birth year], MRN [id number]  Referring Doctor:  Madelyn Brunner, MD   Cardiologist:   Wende Bushy, MD   Reason for consultation:  Chief Complaint  Patient presents with  . other    2 wk f/u echo no complaints. Meds reviewed verbally with pt.      History of Present Illness: Donald Henry is a 82 y.o. male who presents for Follow-up for CHF, CAD, A. fib.   In terms of his CHF, shortness of breath is somewhat improved but not completely resolved since Lasix dose was increased. Although he he was advised to increase the dose to 20 mg twice a day and his last visit March 8, he only increase it after the pulmonary doctor asked him to go up on the dose.  In terms of coronary artery disease, patient reports having a heart attack in his 1s. That was medically managed. He did not have any stenting or bypass done.  In terms of atrial fibrillation, he is aware of the diagnosis but he does not recall having any issues with the irregular heartbeat. He denies having any symptoms of palpitations or feeling of the change in rhythm. He says that he was stared on Eliquis sometime in November when he was diagnosed to have bilateral pulmonary emboli. He does not recall being on warfarin prior to that. He does not have any issues with bleeding.  In terms of the pacemaker, he recalls that placed 2 years ago.   ROS:  Please see the history of present illness. Aside from mentioned under HPI, all other systems are reviewed and negative.     Past Medical History  Diagnosis Date  . Diabetes mellitus without complication ([place])   . SSS (sick sinus syndrome) (HCC)     a. s/p MDT ADSRO1 Single Lead PPM.  . Myocardial infarction [facility])     a. MI @ age 14-->conservatively managed.  Has never had a cath.  Reports multiple negative stress tests.  . Ischemic cardiomyopathy   . Chronic combined systolic (congestive) and  diastolic (congestive) heart failure ([place])     a. 01/2015 Echo: EF 40%, mod MR/PR/TR;  b. 10/2015 Echo: EF 20-25%, sev MR, sev dil LA/RA, sev TR.  Marland Kitchen Hypertensive heart disease   . Pulmonary embolism ([place])     a. 10/2015 ->Eliquis.  . Normocytic anemia   . Peptic ulcer disease   . Obesity   . CKD (chronic kidney disease), stage III   . BPH (benign prostatic hyperplasia)   . Recurrent right pleural effusion     a. 01/28/2016 1.7L fluid removal via thoracentesis; b. 02/19/2016 2.4L removal via thoracentesis.  . Severe mitral regurgitation     a. 10/2015 Echo: sev MR.  . Hiatal hernia     Past Surgical History  Procedure Laterality Date  . Insert / replace / remove pacemaker      Medtronic  Serial # [id number] H [id number] V     reports that he has never smoked. He does not have any smokeless tobacco history on file. He reports that he does not drink alcohol or use illicit drugs.   family history includes Diabetes Mellitus II in his brother and sister; Heart attack in his son and son; Heart disease in his father and mother; Leukemia in his sister.   Current Outpatient Prescriptions  Medication Sig Dispense Refill  . apixaban (ELIQUIS) 2.5 MG TABS tablet Take  1 tablet (2.5 mg total) by mouth 2 (two) times daily. 60 tablet 0  . atorvastatin (LIPITOR) 40 MG tablet Take 1 tablet (40 mg total) by mouth daily. 30 tablet 0  . chlorthalidone (HYGROTON) 25 MG tablet Take 25 mg by mouth daily.    . furosemide (LASIX) 20 MG tablet Take 20 mg by mouth 2 (two) times daily.     Marland Kitchen glipiZIDE (GLUCOTROL) 10 MG tablet Take 10 mg by mouth daily.     . isosorbide mononitrate (IMDUR) 30 MG 24 hr tablet Take 30 mg by mouth daily.    Marland Kitchen omeprazole (PRILOSEC) 20 MG capsule Take 20 mg by mouth daily.    Marland Kitchen terazosin (HYTRIN) 2 MG capsule Take 2 mg by mouth at bedtime.    . traMADol (ULTRAM) 50 MG tablet Take 1 tablet (50 mg total) by mouth every 6 (six) hours as needed. (Patient taking differently: Take 50 mg by  mouth every 6 (six) hours as needed for moderate pain. ) 20 tablet 0  . vitamin B-12 (CYANOCOBALAMIN) 1000 MCG tablet Take 1,000 mcg by mouth daily.    Marland Kitchen zolpidem (AMBIEN) 5 MG tablet Take 5 mg by mouth at bedtime as needed for sleep.    . carvedilol (COREG) 6.25 MG tablet Take 1 tablet (6.25 mg total) by mouth 2 (two) times daily. 30 tablet 6   No current facility-administered medications for this visit.    Allergies: Aspirin    PHYSICAL EXAM: VS:  BP 130/71 mmHg  Pulse 86  Ht 5\' 7"  (1.702 m)  Wt 161 lb 12 oz (73.369 kg)  BMI 25.33 kg/m2 , Body mass index is 25.33 kg/(m^2). Wt Readings from Last 3 Encounters:  03/22/16 161 lb 12 oz (73.369 kg)  03/03/16 167 lb 8 oz (75.978 kg)  02/20/16 168 lb 3.2 oz (76.295 kg)    GENERAL:  well developed, well nourished, not in acute distress HEENT: normocephalic, pink conjunctivae, anicteric sclerae, no xanthelasma, normal dentition, oropharynx clear NECK:No neck vein engorgement, no hepatojugular reflux, carotid upstroke brisk and symmetric, no bruit, no thyromegaly, no lymphadenopathy LUNGS:  good respiratory effort, clear to auscultation bilaterally CV:  PMI laterally displaced, no thrills, no lifts, S2 within normal limits, no palpable S3 or S4, MR murmur soft, no rubs, no gallops ABD:  Soft, nontender, nondistended, normoactive bowel sounds, no abdominal aortic bruit, possible hepatomegaly MS: nontender back, no kyphosis, no scoliosis, no joint deformities EXT:  2+ DP/PT pulses, improved edema, no varicosities, no cyanosis, no clubbing SKIN: warm, nondiaphoretic, normal turgor, no ulcers NEUROPSYCH: alert, oriented to person, place, and time, sensory/motor grossly intact, normal mood, appropriate affect  Recent Labs: 11/18/2015: ALT 21 02/19/2016: B Natriuretic Peptide 1023.0* 02/20/2016: Hemoglobin 9.5*; Platelets 182; TSH 1.195 03/10/2016: BUN 39*; Creatinine, Ser 1.57*; Potassium 4.8; Sodium 138   Lipid Panel No results found for:  CHOL, TRIG, HDL, CHOLHDL, VLDL, LDLCALC, LDLDIRECT   Other studies Reviewed:  EKG:  EKG is ordered today. EKG from 03/22/2016 showed adequate chronic ventricular pacemaker, underlying rhythm likely atrophic ablation. The ekg ordered 03/03/2016 was personally reviewed by me and it reveals electronic ventricular pacemaker, ventricular rate 69 BPM. Underlying rhythm is atrial fibrillation.  Additional studies/ records that were reviewed personally reviewed by me today include:  Echo: 11/19/2015, and LV cavity size severely dilated. EF 20-25%. Severe MR. LA severely dilated. RV moderately dilated RA severely dilated. Tricuspid valve severe regurgitation.  Echo 03/16/2016: Left ventricle: The cavity size was normal. There was moderate  concentric hypertrophy. Systolic  function was moderately to  severely reduced. The estimated ejection fraction was in the  range of 30% to 35%. - Mitral valve: There was moderate regurgitation. - Left atrium: The atrium was moderately dilated. - Right atrium: The atrium was mildly dilated. - Tricuspid valve: There was moderate regurgitation. - Pulmonary arteries: Systolic pressure was moderately increased.  PA peak pressure: 50 mm Hg (S).  ASSESSMENT AND PLAN:  Congestive heart failure, systolic dysfunction, mild acute exacerbation on top of chronic - improved volume status. Shortness of breath persists History of coronary artery disease status post MI Cardiomyopathy- possibly ischemic cardiomyopathy, versus tachycardia induced/atrial fibrillation   Continue Lasix 20 mg twice a day now.  Daily weights. Sodium restriction. Increase carvedilol to 6.25 twice a day. Will evaluate for entresto on ffup.   Continue statin therapy. LDL goal is less than 70. Ejection fraction is slightly improved on echocardiogram 03/16/2016, although it remains depressed. Recommend ischemic evaluation with stress testing. Pharmacological stress test.   Likely permanent atrial  fibrillation It appears from history, patient is not aware of the arrhythmia at all. Biatrial enlargement is severe. Question the utility of cardioversion.  Patient will be seeing EP service anyway. He has been on beta blocker and eliquis. Dosed at 2.5 mg twice a day due to age more than 25 and creatinine more than 1.5. Instructed patient to take close to 12 hours apart.  SSS: s/p MDT PPM. Last interrogated in December  2016. Per note in care everywhere "normal pacemaker function. Battery at ERI/RRT. F/U 6 months."We will arrange for f/u with Dr. Caryl Comes soon, sometime in early April 2017.   Severe Mitral Regurgitation/TR Repeat echo shows moderate MR, moderate TR. Likely related to dilated cardiopathy.    Hypertension  BP is well controlled. Continue monitoring BP. Continue current medical therapy and lifestyle changes.   Hyperlipidemia. Cont statin. LDL 86 in 03/2015. PCP following labs. LDL goal is less than 70 for recommendation.   Current medicines are reviewed at length with the patient today.  The patient does not have concerns regarding medicines.  Labs/ tests ordered today include:  Orders Placed This Encounter  Procedures  . NM Myocar Multi W/Spect W/Wall Motion / EF  . EKG 12-Lead    I had a lengthy and detailed discussion with the patient regarding diagnoses, prognosis, diagnostic options, treatment options, and side effects of medications.   I counseled the patient on importance of lifestyle modification including heart healthy diet, regular physical activity.    Disposition:   FU with undersigned in One month Signed, Wende Bushy, MD  03/22/2016 4:27 PM    [place]

## 2016-03-22 NOTE — Patient Instructions (Addendum)
Medication Instructions:  Your physician has recommended you make the following change in your medication:  1. Coreg 6.25 mg twice daily   Labwork: None ordered  Testing/Procedures: Your physician has requested that you have a lexiscan myoview. For further information please visit [email or web address]. Please follow instruction sheet, as given.  Date & Time: ____Monday March 29, 2016 at 09:00 AM_____________________________   Follow-Up: Your physician recommends that you schedule a follow-up appointment in: 1 month with Dr. Yvone Neu  Date & Time: ___________________________________________________________   Any Other Special Instructions Will Be Listed Below (If Applicable). [place]  Your caregiver has ordered a Stress Test with nuclear imaging. The purpose of this test is to evaluate the blood supply to your heart muscle. This procedure is referred to as a "Non-Invasive Stress Test." This is because other than having an IV started in your vein, nothing is inserted or "invades" your body. Cardiac stress tests are done to find areas of poor blood flow to the heart by determining the extent of coronary artery disease (CAD). Some patients exercise on a treadmill, which naturally increases the blood flow to your heart, while others who are  unable to walk on a treadmill due to physical limitations have a pharmacologic/chemical stress agent called Lexiscan . This medicine will mimic walking on a treadmill by temporarily increasing your coronary blood flow.   Please note: these test may take anywhere between 2-4 hours to complete  PLEASE REPORT TO [place] AT THE FIRST DESK WILL DIRECT YOU WHERE TO GO  Date of Procedure:__Monday March 29, 2016 at 09:00 AM__________  Arrival Time for Procedure:____Arrive at 08:45 AM_________  Instructions regarding medication:   __X__ : Hold diabetes medication morning of procedure  __X__:  Hold Coreg and Imdur the  night before and morning of your stress test    PLEASE NOTIFY THE OFFICE AT LEAST 24 HOURS IN ADVANCE IF YOU ARE UNABLE TO KEEP YOUR APPOINTMENT.  [phone] AND  PLEASE NOTIFY NUCLEAR MEDICINE AT [facility] AT LEAST 24 HOURS IN ADVANCE IF YOU ARE UNABLE TO KEEP YOUR APPOINTMENT. [phone]  How to prepare for your Myoview test:  1. Do not eat or drink after midnight 2. No caffeine for 24 hours prior to test 3. No smoking 24 hours prior to test. 4. Your medication may be taken with water.  If your doctor stopped a medication because of this test, do not take that medication. 5. Ladies, please do not wear dresses.  Skirts or pants are appropriate. Please wear a short sleeve shirt. 6. No perfume, cologne or lotion. 7. Wear comfortable walking shoes. No heels!              If you need a refill on your cardiac medications before your next appointment, please call your pharmacy.  Pharmacologic Stress Electrocardiogram A pharmacologic stress electrocardiogram is a heart (cardiac) test that uses nuclear imaging to evaluate the blood supply to your heart. This test may also be called a pharmacologic stress electrocardiography. Pharmacologic means that a medicine is used to increase your heart rate and blood pressure.  This stress test is done to find areas of poor blood flow to the heart by determining the extent of coronary artery disease (CAD). Some people exercise on a treadmill, which naturally increases the blood flow to the heart. For those people unable to exercise on a treadmill, a medicine is used. This medicine stimulates your heart and will cause your heart to beat harder and more quickly,  as if you were exercising.  Pharmacologic stress tests can help determine:  The adequacy of blood flow to your heart during increased levels of activity in order to clear you for discharge home.  The extent of coronary artery blockage caused by CAD.  Your prognosis if you have suffered a  heart attack.  The effectiveness of cardiac procedures done, such as an angioplasty, which can increase the circulation in your coronary arteries.  Causes of chest pain or pressure. LET [facility] CARE PROVIDER KNOW ABOUT:  Any allergies you have.  All medicines you are taking, including vitamins, herbs, eye drops, creams, and over-the-counter medicines.  Previous problems you or members of your family have had with the use of anesthetics.  Any blood disorders you have.  Previous surgeries you have had.  Medical conditions you have.  Possibility of pregnancy, if this applies.  If you are currently breastfeeding. RISKS AND COMPLICATIONS Generally, this is a safe procedure. However, as with any procedure, complications can occur. Possible complications include: 8. You develop pain or pressure in the following areas: 1. Chest. 2. Jaw or neck. 3. Between your shoulder blades. 4. Radiating down your left arm. 9. Headache. 10. Dizziness or light-headedness. 11. Shortness of breath. 12. Increased or irregular heartbeat. 13. Low blood pressure. 14. Nausea or vomiting. 15. Flushing. 16. Redness going up the arm and slight pain during injection of medicine. 17. Heart attack (rare). BEFORE THE PROCEDURE   Avoid all forms of caffeine for 24 hours before your test or as directed by your health care provider. This includes coffee, tea (even decaffeinated tea), caffeinated sodas, chocolate, cocoa, and certain pain medicines.  Follow your health care provider's instructions regarding eating and drinking before the test.  Take your medicines as directed at regular times with water unless instructed otherwise. Exceptions may include:  If you have diabetes, ask how you are to take your insulin or pills. It is common to adjust insulin dosing the morning of the test.  If you are taking beta-blocker medicines, it is important to talk to your health care provider about these medicines well  before the date of your test. Taking beta-blocker medicines may interfere with the test. In some cases, these medicines need to be changed or stopped 24 hours or more before the test.  If you wear a nitroglycerin patch, it may need to be removed prior to the test. Ask your health care provider if the patch should be removed before the test.  If you use an inhaler for any breathing condition, bring it with you to the test.  If you are an outpatient, bring a snack so you can eat right after the stress phase of the test.  Do not smoke for 4 hours prior to the test or as directed by your health care provider.  Do not apply lotions, powders, creams, or oils on your chest prior to the test.  Wear comfortable shoes and clothing. Let your health care provider know if you were unable to complete or follow the preparations for your test. PROCEDURE   Multiple patches (electrodes) will be put on your chest. If needed, small areas of your chest may be shaved to get better contact with the electrodes. Once the electrodes are attached to your body, multiple wires will be attached to the electrodes, and your heart rate will be monitored.  An IV access will be started. A nuclear trace (isotope) is given. The isotope may be given intravenously, or it may be swallowed.  Nuclear refers to several types of radioactive isotopes, and the nuclear isotope lights up the arteries so that the nuclear images are clear. The isotope is absorbed by your body. This results in low radiation exposure.  A resting nuclear image is taken to show how your heart functions at rest.  A medicine is given through the IV access.  A second scan is done about 1 hour after the medicine injection and determines how your heart functions under stress.  During this stress phase, you will be connected to an electrocardiogram machine. Your blood pressure and oxygen levels will be monitored. AFTER THE PROCEDURE   Your heart rate and blood  pressure will be monitored after the test.  You may return to your normal schedule, including diet,activities, and medicines, unless your health care provider tells you otherwise.   This information is not intended to replace advice given to you by your health care provider. Make sure you discuss any questions you have with your health care provider.   Document Released: 05/01/2009 Document Revised: 12/18/2013 Document Reviewed: 08/20/2013 Elsevier Interactive Patient Education [employer].

## 2016-03-22 NOTE — Telephone Encounter (Signed)
Please contact the pharmacy. Thanks!

## 2016-03-22 NOTE — Telephone Encounter (Signed)
Spoke to [place] at [facility] in [place] and verified that patients medication was changed to 6.25 mg twice daily.

## 2016-03-22 NOTE — Telephone Encounter (Signed)
Maggie calling from [facility]  Stating that she just wants to confirm that we are just changing the dose on Coreg  It was 3.125 twice a day now it is 6.25 twice a day Please call back so they can fill the prescription.

## 2016-03-29 ENCOUNTER — Encounter
Admission: RE | Admit: 2016-03-29 | Discharge: 2016-03-29 | Disposition: A | Discharge status: Home / Self Care | Payer: Medicare Other | Source: Ambulatory Visit | Attending: Cardiology | Admitting: Cardiology

## 2016-03-29 DIAGNOSIS — R0602 Shortness of breath: Secondary | ICD-10-CM | POA: Insufficient documentation

## 2016-03-29 LAB — NM MYOCAR MULTI W/SPECT W/WALL MOTION / EF
CHL CUP RESTING HR STRESS: 64 {beats}/min
LVDIAVOL: 173 mL (ref 62–150)
LVSYSVOL: 100 mL
NUC STRESS TID: 1.06
Peak HR: 63 {beats}/min
Percent HR: 45 %
SDS: 11
SRS: 9
SSS: 16

## 2016-03-29 MED ORDER — REGADENOSON 0.4 MG/5ML IV SOLN
0.4000 mg | Freq: Once | INTRAVENOUS | Status: AC
  Administered 2016-03-29: 0.4 mg via INTRAVENOUS

## 2016-03-29 MED ORDER — TECHNETIUM TC 99M SESTAMIBI - CARDIOLITE
31.7700 | Freq: Once | INTRAVENOUS | Status: AC | PRN
  Administered 2016-03-29: 31.77 via INTRAVENOUS

## 2016-03-29 MED ORDER — TECHNETIUM TC 99M SESTAMIBI - CARDIOLITE
11.9700 | Freq: Once | INTRAVENOUS | Status: AC | PRN
  Administered 2016-03-29: 11.97 via INTRAVENOUS

## 2016-04-01 ENCOUNTER — Other Ambulatory Visit: Payer: Self-pay | Admitting: Specialist

## 2016-04-01 ENCOUNTER — Ambulatory Visit: Payer: Medicare Other | Admitting: Cardiology

## 2016-04-01 DIAGNOSIS — J90 Pleural effusion, not elsewhere classified: Secondary | ICD-10-CM

## 2016-04-02 ENCOUNTER — Emergency Department
Admission: EM | Admit: 2016-04-02 | Discharge: 2016-04-02 | Disposition: A | Discharge status: Home / Self Care | Payer: Medicare Other | Attending: Emergency Medicine | Admitting: Emergency Medicine

## 2016-04-02 ENCOUNTER — Emergency Department: Payer: Medicare Other

## 2016-04-02 ENCOUNTER — Encounter: Payer: Self-pay | Admitting: Emergency Medicine

## 2016-04-02 DIAGNOSIS — Z79899 Other long term (current) drug therapy: Secondary | ICD-10-CM | POA: Insufficient documentation

## 2016-04-02 DIAGNOSIS — I2699 Other pulmonary embolism without acute cor pulmonale: Secondary | ICD-10-CM | POA: Diagnosis not present

## 2016-04-02 DIAGNOSIS — Z7984 Long term (current) use of oral hypoglycemic drugs: Secondary | ICD-10-CM | POA: Insufficient documentation

## 2016-04-02 DIAGNOSIS — I252 Old myocardial infarction: Secondary | ICD-10-CM | POA: Insufficient documentation

## 2016-04-02 DIAGNOSIS — I130 Hypertensive heart and chronic kidney disease with heart failure and stage 1 through stage 4 chronic kidney disease, or unspecified chronic kidney disease: Secondary | ICD-10-CM | POA: Diagnosis not present

## 2016-04-02 DIAGNOSIS — R0600 Dyspnea, unspecified: Secondary | ICD-10-CM | POA: Diagnosis present

## 2016-04-02 DIAGNOSIS — Z950 Presence of cardiac pacemaker: Secondary | ICD-10-CM | POA: Diagnosis not present

## 2016-04-02 DIAGNOSIS — N183 Chronic kidney disease, stage 3 (moderate): Secondary | ICD-10-CM | POA: Insufficient documentation

## 2016-04-02 DIAGNOSIS — I4891 Unspecified atrial fibrillation: Secondary | ICD-10-CM | POA: Insufficient documentation

## 2016-04-02 DIAGNOSIS — E6609 Other obesity due to excess calories: Secondary | ICD-10-CM | POA: Insufficient documentation

## 2016-04-02 DIAGNOSIS — E119 Type 2 diabetes mellitus without complications: Secondary | ICD-10-CM | POA: Diagnosis not present

## 2016-04-02 DIAGNOSIS — I340 Nonrheumatic mitral (valve) insufficiency: Secondary | ICD-10-CM | POA: Insufficient documentation

## 2016-04-02 DIAGNOSIS — I5042 Chronic combined systolic (congestive) and diastolic (congestive) heart failure: Secondary | ICD-10-CM | POA: Insufficient documentation

## 2016-04-02 DIAGNOSIS — J90 Pleural effusion, not elsewhere classified: Secondary | ICD-10-CM | POA: Diagnosis not present

## 2016-04-02 DIAGNOSIS — I2510 Atherosclerotic heart disease of native coronary artery without angina pectoris: Secondary | ICD-10-CM | POA: Diagnosis not present

## 2016-04-02 LAB — BASIC METABOLIC PANEL
Anion gap: 7 (ref 5–15)
BUN: 66 mg/dL — AB (ref 6–20)
CALCIUM: 9 mg/dL (ref 8.9–10.3)
CO2: 30 mmol/L (ref 22–32)
CREATININE: 1.73 mg/dL — AB (ref 0.61–1.24)
Chloride: 97 mmol/L — ABNORMAL LOW (ref 101–111)
GFR, EST AFRICAN AMERICAN: 40 mL/min — AB (ref >60)
GFR, EST NON AFRICAN AMERICAN: 35 mL/min — AB (ref >60)
Glucose, Bld: 123 mg/dL — ABNORMAL HIGH (ref 65–99)
Potassium: 3.6 mmol/L (ref 3.5–5.1)
SODIUM: 134 mmol/L — AB (ref 135–145)

## 2016-04-02 LAB — CBC
HCT: 30.6 % — ABNORMAL LOW (ref 40.0–52.0)
HEMOGLOBIN: 10.4 g/dL — AB (ref 13.0–18.0)
MCH: 31.6 pg (ref 26.0–34.0)
MCHC: 34.1 g/dL (ref 32.0–36.0)
MCV: 92.8 fL (ref 80.0–100.0)
PLATELETS: 195 10*3/uL (ref 150–440)
RBC: 3.3 MIL/uL — ABNORMAL LOW (ref 4.40–5.90)
RDW: 18.4 % — ABNORMAL HIGH (ref 11.5–14.5)
WBC: 8.1 10*3/uL (ref 3.8–10.6)

## 2016-04-02 LAB — TROPONIN I
Troponin I: 0.22 ng/mL — ABNORMAL HIGH (ref <0.031)
Troponin I: 0.22 ng/mL — ABNORMAL HIGH (ref <0.031)

## 2016-04-02 LAB — PROTIME-INR
INR: 1.5
PROTHROMBIN TIME: 18.2 s — AB (ref 11.4–15.0)

## 2016-04-02 LAB — BRAIN NATRIURETIC PEPTIDE: B NATRIURETIC PEPTIDE 5: 1110 pg/mL — AB (ref 0.0–100.0)

## 2016-04-02 LAB — APTT: aPTT: 37 seconds — ABNORMAL HIGH (ref 24–36)

## 2016-04-02 MED ORDER — BACITRACIN-NEOMYCIN-POLYMYXIN 400-5-5000 EX OINT
TOPICAL_OINTMENT | CUTANEOUS | Status: AC
  Filled 2016-04-02: qty 1

## 2016-04-02 NOTE — ED Notes (Signed)
Pt sitting in bed resp even and unlabored. Pt able to talk in complete sentences w/o difficulty.  NAD.

## 2016-04-02 NOTE — ED Notes (Signed)
Patient's O2 dropping to 87% on room air. Patient appeared to be sleeping at time. After arousal, O2 returned to 95% on room air. Nellie, RN made aware

## 2016-04-02 NOTE — ED Notes (Addendum)
Pt transported to [facility]

## 2016-04-02 NOTE — Discharge Instructions (Signed)
You have been seen in the Emergency Department (ED) today for shortness of breath, and he had fluid taken off your right lung again.   Please follow up with the recommended doctor as instructed above in these documents regarding todays emergent visit and your recent symptoms to discuss further management.   Return to the Emergency Department (ED) if you experience any chest pain/pressure/tightness, difficulty breathing, or sudden sweating, or other symptoms that concern you.

## 2016-04-02 NOTE — Procedures (Signed)
Successful RT THORACENTESIS 2.175 L REMOVED NO COMP STABLE NO LABS REQUESTED CXR PENDING FULL REPORT IN PACS

## 2016-04-02 NOTE — ED Notes (Signed)
Pt here with worsening shob over the past few days, has chf and gets "his lungs drained," every now and then. States he is supposed to have procedure done next Wed, but states he cannot wait that long. Does not appear in resp distress at this time.

## 2016-04-02 NOTE — ED Provider Notes (Signed)
[facility] Emergency Department Provider Note  ____________________________________________  Time seen: Approximately 11:30 AM  I have reviewed the triage vital signs and the nursing notes.   HISTORY  Chief Complaint Shortness of Breath    HPI Donald Henry is a 82 y.o. male history of congestive heart failure, MI, pulmonary embolism currently anticoagulated, and recurrent pleural effusion, particularly on the right side.  Patient reports he's been developing slowly increasing dyspnea over the last few days. He saw pulmonary this week who had arranged outpatient for him on Wednesday, but he doesn't believe he can make a total Wednesday without having fluid drained from the right lung.  He does report feeling shortness of breath, worse when he lays down. He feels very winded whenever he goes to walk. He denies any chest pain. No abdominal pain. No trouble swallowing.  No cough or fevers.   Past Medical History  Diagnosis Date  . Diabetes mellitus without complication ([place])   . SSS (sick sinus syndrome) (HCC)     a. s/p MDT ADSRO1 Single Lead PPM.  . Myocardial infarction [facility])     a. MI @ age 15-->conservatively managed.  Has never had a cath.  Reports multiple negative stress tests.  . Ischemic cardiomyopathy   . Chronic combined systolic (congestive) and diastolic (congestive) heart failure ([place])     a. 01/2015 Echo: EF 40%, mod MR/PR/TR;  b. 10/2015 Echo: EF 20-25%, sev MR, sev dil LA/RA, sev TR.  Marland Kitchen Hypertensive heart disease   . Pulmonary embolism ([place])     a. 10/2015 ->Eliquis.  . Normocytic anemia   . Peptic ulcer disease   . Obesity   . CKD (chronic kidney disease), stage III   . BPH (benign prostatic hyperplasia)   . Recurrent right pleural effusion     a. 01/28/2016 1.7L fluid removal via thoracentesis; b. 02/19/2016 2.4L removal via thoracentesis.  . Severe mitral regurgitation     a. 10/2015 Echo: sev MR.  . Hiatal hernia      Patient Active Problem List   Diagnosis Date Noted  . Mitral regurgitation 03/03/2016  . Hyperlipidemia 03/03/2016  . Acute on chronic systolic congestive heart failure ([place]) 03/03/2016  . Secondary cardiomyopathy ([place]) 03/03/2016  . Pleural effusion 02/19/2016  . Acute pulmonary embolism ([place]) 11/18/2015  . Atrial fibrillation ([place]) 11/18/2015  . Benign essential HTN 05/22/2015  . Chronic atrial fibrillation ([place]) 01/14/2015  . Arteriosclerosis of coronary artery 06/25/2014  . Sick sinus syndrome ([place]) 06/25/2014    Past Surgical History  Procedure Laterality Date  . Insert / replace / remove pacemaker      [employer]  Serial # [id number]    Current Outpatient Rx  Name  Route  Sig  Dispense  Refill  . apixaban (ELIQUIS) 2.5 MG TABS tablet   Oral   Take 1 tablet (2.5 mg total) by mouth 2 (two) times daily.   60 tablet   0   . atorvastatin (LIPITOR) 40 MG tablet   Oral   Take 1 tablet (40 mg total) by mouth daily.   30 tablet   0   . carvedilol (COREG) 6.25 MG tablet   Oral   Take 1 tablet (6.25 mg total) by mouth 2 (two) times daily.   30 tablet   6   . chlorthalidone (HYGROTON) 25 MG tablet   Oral   Take 25 mg by mouth daily.         . furosemide (LASIX) 20 MG tablet  Oral   Take 20 mg by mouth 2 (two) times daily.          Marland Kitchen glipiZIDE (GLUCOTROL) 10 MG tablet   Oral   Take 10 mg by mouth daily.          . isosorbide mononitrate (IMDUR) 30 MG 24 hr tablet   Oral   Take 30 mg by mouth daily.         Marland Kitchen terazosin (HYTRIN) 2 MG capsule   Oral   Take 2 mg by mouth at bedtime.         . vitamin B-12 (CYANOCOBALAMIN) 1000 MCG tablet   Oral   Take 1,000 mcg by mouth daily.         Marland Kitchen zolpidem (AMBIEN) 5 MG tablet   Oral   Take 5 mg by mouth at bedtime as needed for sleep.         . traMADol (ULTRAM) 50 MG tablet   Oral   Take 1 tablet (50 mg total) by mouth every 6 (six) hours as needed. Patient taking differently: Take  50 mg by mouth every 6 (six) hours as needed for moderate pain.    20 tablet   0     Allergies Aspirin  Family History  Problem Relation Age of Onset  . Heart disease Mother   . Heart disease Father     a. First MI @ 79.  . Leukemia Sister   . Diabetes Mellitus II Sister   . Diabetes Mellitus II Brother   . Heart attack Son     a. MI @ 36  . Heart attack Son     a. MI @ 41.    Social History Social History  Substance Use Topics  . Smoking status: Never Smoker   . Smokeless tobacco: None  . Alcohol Use: No    Review of Systems Constitutional: No fever/chills Eyes: No visual changes. ENT: No sore throat. Cardiovascular: Denies chest pain. Respiratory: See history of present illness . No wheezing or cough.  Gastrointestinal: No abdominal pain.  No nausea, no vomiting.  No diarrhea.  No constipation. Genitourinary: Negative for dysuria. Musculoskeletal: Negative for back pain. Skin: Negative for rash. Neurological: Negative for headaches, focal weakness or numbness.  10-point ROS otherwise negative.  ____________________________________________   PHYSICAL EXAM:  VITAL SIGNS: ED Triage Vitals  Enc Vitals Group     BP 04/02/16 1049 134/56 mmHg     Pulse Rate 04/02/16 1049 61     Resp 04/02/16 1049 20     Temp 04/02/16 1049 97.7 F (36.5 C)     Temp Source 04/02/16 1049 Oral     SpO2 04/02/16 1049 97 %     Weight 04/02/16 1048 166 lb (75.297 kg)     Height 04/02/16 1048 5\' 7"  (1.702 m)     Head Cir --      Peak Flow --      Pain Score --      Pain Loc --      Pain Edu? --      Excl. in [place]? --    Constitutional: Alert and oriented. Well appearing and in no acute distress. Eyes: Conjunctivae are normal. PERRL. EOMI. Head: Atraumatic. Nose: No congestion/rhinnorhea. Mouth/Throat: Mucous membranes are moist.  Oropharynx non-erythematous. Neck: No stridor.   Cardiovascular: Normal rate, regular rhythm. Grossly normal heart sounds.  Good peripheral  circulation. Respiratory: Mild use of accessory muscles, very minimal, but present evidence of minimal increased work of breathing.  Does appear to get slightly dyspneic after talking. Right lower lobe with distant breath sounds, left lung clear. Right upper lung sounds clear. No wheezing. No retractions. Lungs CTAB. Gastrointestinal: Soft and nontender. No distention.  Musculoskeletal: No lower extremity tenderness nor edema.  No joint effusions. Neurologic:  Normal speech and language. No gross focal neurologic deficits are appreciated. Skin:  Skin is warm, dry and intact. No rash noted. Psychiatric: Mood and affect are normal. Speech and behavior are normal.  ____________________________________________   LABS (all labs ordered are listed, but only abnormal results are displayed)  Labs Reviewed  CBC - Abnormal; Notable for the following:    RBC 3.30 (*)    Hemoglobin 10.4 (*)    HCT 30.6 (*)    RDW 18.4 (*)    All other components within normal limits  BASIC METABOLIC PANEL - Abnormal; Notable for the following:    Sodium 134 (*)    Chloride 97 (*)    Glucose, Bld 123 (*)    BUN 66 (*)    Creatinine, Ser 1.73 (*)    GFR calc non Af Amer 35 (*)    GFR calc Af Amer 40 (*)    All other components within normal limits  BRAIN NATRIURETIC PEPTIDE - Abnormal; Notable for the following:    B Natriuretic Peptide 1110.0 (*)    All other components within normal limits  PROTIME-INR - Abnormal; Notable for the following:    Prothrombin Time 18.2 (*)    All other components within normal limits  APTT - Abnormal; Notable for the following:    aPTT 37 (*)    All other components within normal limits  TROPONIN I - Abnormal; Notable for the following:    Troponin I 0.22 (*)    All other components within normal limits  TROPONIN I - Abnormal; Notable for the following:    Troponin I 0.22 (*)    All other components within normal limits    ____________________________________________  EKG  Reviewed and interpreted by me at 10:50 AM Ventricular rate 60 QRS 170 QTc 490 Reviewed and interpreted as ventricular paced ____________________________________________  RADIOLOGY  DG Chest 2 View (Final result) Result time: 04/02/16 14:38:29   Final result by Rad Results In Interface (04/02/16 14:38:29)   Narrative:   CLINICAL DATA: Status post right thoracentesis.  EXAM: CHEST 2 VIEW  COMPARISON: 04/02/2016  FINDINGS: Significant improvement in the right effusion following 2.175 L right thoracentesis. Small amount of right pleural fluid persist. Residual right middle and lower lobe atelectasis also evident. No pneumothorax. Heart is enlarged. No current CHF or edema. Left subclavian single lead pacer noted. Aorta is atherosclerotic. Left lung remains clear. Trachea is midline.  IMPRESSION: Significant decrease in the right effusion following thoracentesis. No complicating feature.   Electronically Signed By: Jerilynn Mages. Shick M.D. On: 04/02/2016 14:38          US THORACENTESIS ASP PLEURAL SPACE W/IMG GUIDE (Final result) Result time: 04/02/16 14:09:59   Final result by Rad Results In Interface (04/02/16 14:09:59)   Narrative:   INDICATION: CHF, recurrent right pleural effusion, shortness of breath  EXAM: ULTRASOUND GUIDED RIGHT THORACENTESIS  MEDICATIONS: 1% lidocaine locally  COMPLICATIONS: None immediate.  PROCEDURE: An ultrasound guided thoracentesis was thoroughly discussed with the patient and questions answered. The benefits, risks, alternatives and complications were also discussed. The patient understands and wishes to proceed with the procedure. Written consent was obtained.  Ultrasound was performed to localize and Charita Lindenberger an adequate pocket of fluid  in the right chest. The area was then prepped and draped in the normal sterile fashion. 1% Lidocaine was used for local anesthesia.  Under ultrasound guidance a 6 Fr Safe-T-Centesis catheter was introduced. Thoracentesis was performed. The catheter was removed and a dressing applied.  FINDINGS: A total of approximately 2.175 L of clear pleural fluid was removed.  IMPRESSION: Successful ultrasound guided right thoracentesis yielding 2.175 L of pleural fluid.   Electronically Signed By: Jerilynn Mages. Shick M.D. On: 04/02/2016 14:09          DG Chest 2 View (Final result) Result time: 04/02/16 12:19:48   Final result by Rad Results In Interface (04/02/16 12:19:48)   Narrative:   CLINICAL DATA: Worsening shortness of breath for several days.  EXAM: CHEST 2 VIEW  COMPARISON: February 19, 2016  FINDINGS: No pneumothorax. There is a right-sided pleural effusion with underlying opacity, worsened in the interval. Mild edema is identified. The cardiomediastinal silhouette is stable. No other interval changes.  IMPRESSION: Increasing right-sided pleural effusion with underlying opacity. Mild edema. Recommend follow-up to resolution.   Electronically Signed By: Dorise Bullion III M.D On: 04/02/2016 12:19    ____________________________________________   PROCEDURES  Procedure(s) performed: None  Critical Care performed: No  ____________________________________________   INITIAL IMPRESSION / ASSESSMENT AND PLAN / ED COURSE  Pertinent labs & imaging results that were available during my care of the patient were reviewed by me and considered in my medical decision making (see chart for details).  Patient presents for dyspnea. Reports same symptoms required thoracentesis twice in the past, but does not believe he can make it until Wednesday for outpatient procedure as he is getting worsening dyspnea. He does have mild dyspnea, some mild use of accessory muscles but is in no distress. Have discussed with pulmonary and will be attempting to arrange thoracentesis for him today.  No chest pain. No  infectious symptoms. No JVD, patient denies significant weight gain and does not appear that he has active CHF exacerbation aside from recurrence of his effusion.  ----------------------------------------- 11:46 AM on 04/02/2016 ----------------------------------------- Discussed case with Dr. Raul Del, advises that he will work to assist in attempting to arrange thoracentesis for symptomatic pleural effusion today. Also discussed with Dr. Fletcher Anon, as patient had recent positive stress. He advises check a single troponin, if negative could be discharged from cardiac standpoint to follow-up with appointment that started scheduled on April 11.  ----------------------------------------- 4:03 PM on 04/02/2016 -----------------------------------------  Troponins discussed as well as case with Dr. Fletcher Anon who advises close outpatient follow-up on the 11th as scheduled. Patient very agreeable. Currently lungs are clear, 98% oxygen saturation on room air, no evidence of increased work of breathing. Patient states he feels very well and is going to follow up both with cardiology and Dr. Raul Del.  Return precautions and treatment recommendations and follow-up discussed with the patient who is agreeable with the plan.  Lungs clear bilateral.  ____________________________________________   FINAL CLINICAL IMPRESSION(S) / ED DIAGNOSES  Final diagnoses:  Dyspnea  Pleural effusion, right      Delman Kitten, MD 04/02/16 1604

## 2016-04-06 ENCOUNTER — Other Ambulatory Visit: Payer: Self-pay | Admitting: Specialist

## 2016-04-06 ENCOUNTER — Telehealth: Payer: Self-pay | Admitting: Cardiology

## 2016-04-06 ENCOUNTER — Ambulatory Visit (INDEPENDENT_AMBULATORY_CARE_PROVIDER_SITE_OTHER): Payer: Medicare Other | Admitting: Cardiology

## 2016-04-06 ENCOUNTER — Encounter: Payer: Self-pay | Admitting: Cardiology

## 2016-04-06 VITALS — BP 124/68 | HR 73 | Ht 67.0 in | Wt 159.2 lb

## 2016-04-06 DIAGNOSIS — I495 Sick sinus syndrome: Secondary | ICD-10-CM

## 2016-04-06 DIAGNOSIS — I4891 Unspecified atrial fibrillation: Secondary | ICD-10-CM

## 2016-04-06 DIAGNOSIS — I482 Chronic atrial fibrillation, unspecified: Secondary | ICD-10-CM

## 2016-04-06 DIAGNOSIS — I5023 Acute on chronic systolic (congestive) heart failure: Secondary | ICD-10-CM

## 2016-04-06 DIAGNOSIS — I429 Cardiomyopathy, unspecified: Secondary | ICD-10-CM | POA: Diagnosis not present

## 2016-04-06 DIAGNOSIS — I2510 Atherosclerotic heart disease of native coronary artery without angina pectoris: Secondary | ICD-10-CM

## 2016-04-06 DIAGNOSIS — I10 Essential (primary) hypertension: Secondary | ICD-10-CM

## 2016-04-06 DIAGNOSIS — R0602 Shortness of breath: Secondary | ICD-10-CM

## 2016-04-06 MED ORDER — TORSEMIDE 20 MG PO TABS: 40.0000 mg | ORAL_TABLET | Freq: Every day | ORAL | Status: DC

## 2016-04-06 NOTE — Patient Instructions (Addendum)
Medication Instructions:  Your physician has recommended you make the following change in your medication:  1. STOP taking lasix 2. Start taking Torsemide 20 mg 2 tablets once daily   Labwork: Your physician recommends that you return for lab work in: 1 week for BMP  Date & Time:___________________________________________________________   Testing/Procedures: None ordered  Follow-Up: Your physician recommends that you schedule a follow-up appointment in: 1 month with Dr. Yvone Neu  Date & Time: _____________________________________________________________________   [facility], [address], [place] [address]  [phone]  Date & Time:  Apr 27, 2016 at 10:40AM with Dr. Juleen China   Any Other Special Instructions Will Be Listed Below (If Applicable).     If you need a refill on your cardiac medications before your next appointment, please call your pharmacy.

## 2016-04-06 NOTE — Telephone Encounter (Signed)
lmov for patient to call back and schedule 1w lab from today.

## 2016-04-06 NOTE — Progress Notes (Addendum)
Cardiology Office Note   Date:  04/06/2016   ID:  Donald Henry, DOB 1930-03-05, MRN [id number]  Referring Doctor:  Madelyn Brunner, MD   Cardiologist:   Wende Bushy, MD   Reason for consultation:  Chief Complaint  Patient presents with  . other    F/u ED sob. Meds reviewed verbally with pt.      History of Present Illness: Donald Henry is a 82 y.o. male who presents for Follow-up for CHF, CAD, A. fib.   In terms of his CHF, Patient had to go to the ER just last Friday due to worsening shortness of breath. Patient ended up needing to get a thoracentesis done where they took out 2 L of fluid. He says that he's been taking his Lasix as instructed. He feels that he may not be diuresing enough. He denies chest pain.  In terms of coronary artery disease, patient reports having a heart attack in his 7s. That was medically managed. He did not have any stenting or bypass done.  In terms of atrial fibrillation, he is aware of the diagnosis but he does not recall having any issues with the irregular heartbeat. He denies having any symptoms of palpitations or feeling of the change in rhythm.   In terms of the pacemaker, he recalls that placed 2 years ago.   ROS:  Please see the history of present illness. Aside from mentioned under HPI, all other systems are reviewed and negative.     Past Medical History  Diagnosis Date  . Diabetes mellitus without complication ([place])   . SSS (sick sinus syndrome) (HCC)     a. s/p MDT ADSRO1 Single Lead PPM.  . Myocardial infarction [facility])     a. MI @ age 29-->conservatively managed.  Has never had a cath.  Reports multiple negative stress tests.  . Ischemic cardiomyopathy   . Chronic combined systolic (congestive) and diastolic (congestive) heart failure ([place])     a. 01/2015 Echo: EF 40%, mod MR/PR/TR;  b. 10/2015 Echo: EF 20-25%, sev MR, sev dil LA/RA, sev TR.  Marland Kitchen Hypertensive heart disease   . Pulmonary embolism ([place])     a.  10/2015 ->Eliquis.  . Normocytic anemia   . Peptic ulcer disease   . Obesity   . CKD (chronic kidney disease), stage III   . BPH (benign prostatic hyperplasia)   . Recurrent right pleural effusion     a. 01/28/2016 1.7L fluid removal via thoracentesis; b. 02/19/2016 2.4L removal via thoracentesis.  . Severe mitral regurgitation     a. 10/2015 Echo: sev MR.  . Hiatal hernia     Past Surgical History  Procedure Laterality Date  . Insert / replace / remove pacemaker      Medtronic  Serial # [id number]     reports that he has never smoked. He does not have any smokeless tobacco history on file. He reports that he does not drink alcohol or use illicit drugs.   family history includes Diabetes Mellitus II in his brother and sister; Heart attack in his son and son; Heart disease in his father and mother; Leukemia in his sister.   Current Outpatient Prescriptions  Medication Sig Dispense Refill  . apixaban (ELIQUIS) 2.5 MG TABS tablet Take 1 tablet (2.5 mg total) by mouth 2 (two) times daily. 60 tablet 0  . atorvastatin (LIPITOR) 40 MG tablet Take 1 tablet (40 mg total) by mouth daily. 30 tablet 0  . carvedilol (  COREG) 6.25 MG tablet Take 1 tablet (6.25 mg total) by mouth 2 (two) times daily. 30 tablet 6  . chlorthalidone (HYGROTON) 25 MG tablet Take 25 mg by mouth daily.    Marland Kitchen glipiZIDE (GLUCOTROL) 10 MG tablet Take 10 mg by mouth daily.     . isosorbide mononitrate (IMDUR) 30 MG 24 hr tablet Take 30 mg by mouth daily.    Marland Kitchen terazosin (HYTRIN) 2 MG capsule Take 2 mg by mouth at bedtime.    . traMADol (ULTRAM) 50 MG tablet Take 1 tablet (50 mg total) by mouth every 6 (six) hours as needed. (Patient taking differently: Take 50 mg by mouth every 6 (six) hours as needed for moderate pain. ) 20 tablet 0  . vitamin B-12 (CYANOCOBALAMIN) 1000 MCG tablet Take 1,000 mcg by mouth daily.    Marland Kitchen zolpidem (AMBIEN) 5 MG tablet Take 5 mg by mouth at bedtime as needed for sleep.    Marland Kitchen torsemide  (DEMADEX) 20 MG tablet Take 2 tablets (40 mg total) by mouth daily. 60 tablet 6   No current facility-administered medications for this visit.    Allergies: Aspirin    PHYSICAL EXAM: VS:  BP 124/68 mmHg  Pulse 73  Ht 5\' 7"  (1.702 m)  Wt 159 lb 4 oz (72.235 kg)  BMI 24.94 kg/m2 , Body mass index is 24.94 kg/(m^2). Wt Readings from Last 3 Encounters:  04/06/16 159 lb 4 oz (72.235 kg)  04/02/16 166 lb (75.297 kg)  03/22/16 161 lb 12 oz (73.369 kg)    GENERAL:  well developed, well nourished, not in acute distress HEENT: normocephalic, pink conjunctivae, anicteric sclerae, no xanthelasma, normal dentition, oropharynx clear NECK:No neck vein engorgement, no hepatojugular reflux, carotid upstroke brisk and symmetric, no bruit, no thyromegaly, no lymphadenopathy LUNGS:  good respiratory effort, clear to auscultation bilaterally CV:  PMI laterally displaced, no thrills, no lifts, S2 within normal limits, no palpable S3 or S4, MR murmur soft, no rubs, no gallops ABD:  Soft, nontender, nondistended, normoactive bowel sounds, no abdominal aortic bruit, possible hepatomegaly MS: nontender back, no kyphosis, no scoliosis, no joint deformities EXT:  2+ DP/PT pulses, improved edema, no varicosities, no cyanosis, no clubbing SKIN: warm, nondiaphoretic, normal turgor, no ulcers NEUROPSYCH: alert, oriented to person, place, and time, sensory/motor grossly intact, normal mood, appropriate affect  Recent Labs: 11/18/2015: ALT 21 02/20/2016: TSH 1.195 04/02/2016: B Natriuretic Peptide 1110.0*; BUN 66*; Creatinine, Ser 1.73*; Hemoglobin 10.4*; Platelets 195; Potassium 3.6; Sodium 134*   Lipid Panel No results found for: CHOL, TRIG, HDL, CHOLHDL, VLDL, LDLCALC, LDLDIRECT   Other studies Reviewed:  EKG:   EKG from 04/06/2016 was personally reviewed by me and showed the paced at 73 BPM. Underlying rhythm is likely atrial fibrillation. EKG from 03/22/2016 showed adequate chronic ventricular pacemaker,  underlying rhythm likely atrophic ablation. The ekg ordered 03/03/2016 was personally reviewed by me and it reveals electronic ventricular pacemaker, ventricular rate 69 BPM. Underlying rhythm is atrial fibrillation.  Additional studies/ records that were reviewed personally reviewed by me today include:  Echo: 11/19/2015, and LV cavity size severely dilated. EF 20-25%. Severe MR. LA severely dilated. RV moderately dilated RA severely dilated. Tricuspid valve severe regurgitation.  Echo 03/16/2016: Left ventricle: The cavity size was normal. There was moderate  concentric hypertrophy. Systolic function was moderately to  severely reduced. The estimated ejection fraction was in the  range of 30% to 35%. - Mitral valve: There was moderate regurgitation. - Left atrium: The atrium was moderately dilated. -  Right atrium: The atrium was mildly dilated. - Tricuspid valve: There was moderate regurgitation. - Pulmonary arteries: Systolic pressure was moderately increased.  PA peak pressure: 50 mm Hg (S).   Nuclear stress test 03/29/2016:  Defect 1: There is a large defect of severe severity present in the mid anterior, mid anterolateral, apical anterior and apex location.  Findings consistent with prior myocardial infarction with moderate peri-infarct ischemia.  This is a high risk study.  Nuclear stress EF: 33%.  ASSESSMENT AND PLAN:  Congestive heart failure, systolic dysfunction,  acute exacerbation on top of chronic History of coronary artery disease status post MI Cardiomyopathy- possibly ischemic cardiomyopathy, (more likely versus tachycardia induced/atrial fibrillation)  Trial of torsemide 40 mg by mouth daily. Trial to see if he would respond to this better than Lasix. Check BMP in 1 week Daily weights. Sodium restriction. Continue carvedilol to 6.25 twice a day. Unsure if he will tolerate entresto due to CKD. Continue statin therapy. LDL goal is less than 70. Ejection fraction  is slightly improved on echocardiogram 03/16/2016, although it remains depressed.   In terms of the abnormal stress test, it is consistent with prior MI with moderate peri-infarct ischemia. He has no chest pain. He has ongoing shortness of breath likely due to congestive heart failure. Discussed the results of this test in detail with the patient. Ideally, a left heart catheterization can be done due to ongoing symptoms. However, he has chronic kidney disease with a creatinine that is elevated to almost 1.7, increasing risk of LHCath. Recommend nephrology evaluation as well.  Likely permanent atrial fibrillation It appears from history, patient is not aware of the arrhythmia at all. Biatrial enlargement is severe. Question the utility of cardioversion.  Patient will be seeing EP service anyway. He has been on beta blocker and eliquis. Dosed at 2.5 mg twice a day due to age more than 25 and creatinine more than 1.5. Instructed patient to take close to 12 hours apart.  SSS: s/p MDT PPM. Last interrogated in December  2016. Per note in care everywhere "normal pacemaker function. Battery at ERI/RRT. F/U 6 months."We will arrange for f/u with Dr. Caryl Comes soon, sometime in early April 2017. The visit next week with EP is to establish care with patient's history of sick sinus syndrome status post pacemaker placement. I discussed with the patient possibility of evaluation eventually for upgrade to ICD for primary prevention of SCD. I have a feeling that he may not qualify due to advanced age. Patient will be seen Dr. Caryl Comes soon.  Severe Mitral Regurgitation/TR Repeat echo shows moderate MR, moderate TR. Likely related to dilated cardiopathy.   Hypertension  BP is well controlled. Continue monitoring BP. Continue current medical therapy and lifestyle changes.   Hyperlipidemia. Cont statin. LDL 86 in 03/2015. PCP following labs. LDL goal is less than 70 for recommendation.   Current medicines are  reviewed at length with the patient today.  The patient does not have concerns regarding medicines.  Labs/ tests ordered today include:  Orders Placed This Encounter  Procedures  . Basic Metabolic Panel (BMET)  . Ambulatory referral to Nephrology  . EKG 12-Lead    I had a lengthy and detailed discussion with the patient regarding diagnoses, prognosis, diagnostic options, treatment options, and side effects of medications.       Disposition:   FU with undersigned in One month Signed, Wende Bushy, MD  04/06/2016 1:12 PM    [facility]

## 2016-04-07 ENCOUNTER — Ambulatory Visit: Payer: Medicare Other

## 2016-04-07 ENCOUNTER — Other Ambulatory Visit: Payer: Self-pay | Admitting: Cardiothoracic Surgery

## 2016-04-07 ENCOUNTER — Ambulatory Visit
Admission: RE | Admit: 2016-04-07 | Discharge: 2016-04-07 | Disposition: A | Discharge status: Home / Self Care | Payer: Medicare Other | Source: Ambulatory Visit | Attending: Specialist | Admitting: Specialist

## 2016-04-08 ENCOUNTER — Inpatient Hospital Stay: Payer: Medicare Other | Attending: Cardiothoracic Surgery | Admitting: Cardiothoracic Surgery

## 2016-04-08 DIAGNOSIS — J90 Pleural effusion, not elsewhere classified: Secondary | ICD-10-CM

## 2016-04-13 ENCOUNTER — Encounter: Payer: Self-pay | Admitting: Internal Medicine

## 2016-04-13 ENCOUNTER — Ambulatory Visit (INDEPENDENT_AMBULATORY_CARE_PROVIDER_SITE_OTHER): Payer: Medicare Other | Admitting: *Deleted

## 2016-04-13 DIAGNOSIS — I4821 Permanent atrial fibrillation: Secondary | ICD-10-CM

## 2016-04-13 DIAGNOSIS — I482 Chronic atrial fibrillation: Secondary | ICD-10-CM | POA: Diagnosis not present

## 2016-04-13 LAB — CUP PACEART INCLINIC DEVICE CHECK
Battery Impedance: 326 Ohm
Battery Voltage: 2.78 V
Brady Statistic RV Percent Paced: 98 %
Implantable Lead Implant Date: 20150423
Lead Channel Impedance Value: 0 Ohm
Lead Channel Impedance Value: 611 Ohm
Lead Channel Setting Pacing Amplitude: 2.5 V
Lead Channel Setting Pacing Pulse Width: 0.4 ms
Lead Channel Setting Sensing Sensitivity: 4 mV
MDC IDC LEAD LOCATION: 753860
MDC IDC MSMT BATTERY REMAINING LONGEVITY: 94 mo
MDC IDC MSMT LEADCHNL RV PACING THRESHOLD AMPLITUDE: 0.5 V
MDC IDC MSMT LEADCHNL RV PACING THRESHOLD PULSEWIDTH: 0.4 ms
MDC IDC SESS DTM: 20170418124028

## 2016-04-14 NOTE — Progress Notes (Signed)
Pacemaker check in clinic. Normal device function. Thresholds, sensing, impedances consistent with previous measurements. Device programmed to maximize longevity. 2 high ventricular rates noted, longest ~4sec, peak V 180bpm. Device programmed at appropriate safety margins; increased min RV output to 2.5V. Histogram distribution appropriate for patient activity level. Device programmed to optimize intrinsic conduction. Estimated longevity 8 years. Patient education completed. Patient declines [facility]. ROV with SK on 06/08/16 to establish care.

## 2016-04-16 ENCOUNTER — Emergency Department
Admission: EM | Admit: 2016-04-16 | Discharge: 2016-04-16 | Disposition: A | Discharge status: Home / Self Care | Payer: Medicare Other | Attending: Emergency Medicine | Admitting: Emergency Medicine

## 2016-04-16 ENCOUNTER — Emergency Department: Payer: Medicare Other

## 2016-04-16 ENCOUNTER — Encounter: Payer: Self-pay | Admitting: Emergency Medicine

## 2016-04-16 DIAGNOSIS — J90 Pleural effusion, not elsewhere classified: Secondary | ICD-10-CM | POA: Insufficient documentation

## 2016-04-16 DIAGNOSIS — I5042 Chronic combined systolic (congestive) and diastolic (congestive) heart failure: Secondary | ICD-10-CM | POA: Insufficient documentation

## 2016-04-16 DIAGNOSIS — E669 Obesity, unspecified: Secondary | ICD-10-CM | POA: Insufficient documentation

## 2016-04-16 DIAGNOSIS — E119 Type 2 diabetes mellitus without complications: Secondary | ICD-10-CM | POA: Diagnosis not present

## 2016-04-16 DIAGNOSIS — I252 Old myocardial infarction: Secondary | ICD-10-CM | POA: Diagnosis not present

## 2016-04-16 DIAGNOSIS — R0600 Dyspnea, unspecified: Secondary | ICD-10-CM

## 2016-04-16 DIAGNOSIS — Z7984 Long term (current) use of oral hypoglycemic drugs: Secondary | ICD-10-CM | POA: Diagnosis not present

## 2016-04-16 DIAGNOSIS — I130 Hypertensive heart and chronic kidney disease with heart failure and stage 1 through stage 4 chronic kidney disease, or unspecified chronic kidney disease: Secondary | ICD-10-CM | POA: Diagnosis not present

## 2016-04-16 DIAGNOSIS — Z79899 Other long term (current) drug therapy: Secondary | ICD-10-CM | POA: Insufficient documentation

## 2016-04-16 DIAGNOSIS — N183 Chronic kidney disease, stage 3 (moderate): Secondary | ICD-10-CM | POA: Insufficient documentation

## 2016-04-16 DIAGNOSIS — R0602 Shortness of breath: Secondary | ICD-10-CM | POA: Diagnosis present

## 2016-04-16 LAB — CBC
HCT: 25.4 % — ABNORMAL LOW (ref 40.0–52.0)
HEMOGLOBIN: 8.8 g/dL — AB (ref 13.0–18.0)
MCH: 31.4 pg (ref 26.0–34.0)
MCHC: 34.5 g/dL (ref 32.0–36.0)
MCV: 91 fL (ref 80.0–100.0)
Platelets: 171 10*3/uL (ref 150–440)
RBC: 2.8 MIL/uL — ABNORMAL LOW (ref 4.40–5.90)
RDW: 17.6 % — ABNORMAL HIGH (ref 11.5–14.5)
WBC: 6.3 10*3/uL (ref 3.8–10.6)

## 2016-04-16 LAB — BASIC METABOLIC PANEL
Anion gap: 8 (ref 5–15)
BUN: 77 mg/dL — AB (ref 6–20)
CHLORIDE: 95 mmol/L — AB (ref 101–111)
CO2: 32 mmol/L (ref 22–32)
CREATININE: 2.18 mg/dL — AB (ref 0.61–1.24)
Calcium: 8.6 mg/dL — ABNORMAL LOW (ref 8.9–10.3)
GFR calc Af Amer: 30 mL/min — ABNORMAL LOW (ref >60)
GFR calc non Af Amer: 26 mL/min — ABNORMAL LOW (ref >60)
GLUCOSE: 304 mg/dL — AB (ref 65–99)
Potassium: 4.2 mmol/L (ref 3.5–5.1)
SODIUM: 135 mmol/L (ref 135–145)

## 2016-04-16 LAB — TROPONIN I: TROPONIN I: 0.05 ng/mL — AB (ref <0.031)

## 2016-04-16 NOTE — Discharge Instructions (Signed)
Please follow-up with Dr. Raul Del, by calling the number provided for the next available appointment preferably Monday or Tuesday. Return to the emergency department for any chest pain, or any increased shortness of breath.   Pleural Effusion A pleural effusion is an abnormal buildup of fluid in the layers of tissue between your lungs and the inside of your chest (pleural space). These two layers of tissue that line both your lungs and the inside of your chest are called pleura. Usually, there is no air in the space between the pleura, only a thin layer of fluid. If left untreated, a large amount of fluid can build up and cause the lung to collapse. A pleural effusion is usually caused by another disease that requires treatment. The two main types of pleural effusion are:  Transudative pleural effusion. This happens when fluid leaks into the pleural space because of a low protein count in your blood or high blood pressure in your vessels. Heart failure often causes this.  Exudative infusion. This occurs when fluid collects in the pleural space from blocked blood vessels or lymph vessels. Some lung diseases, injuries, and cancers can cause this type of effusion. CAUSES Pleural effusion can be caused by:  Heart failure.  A blood clot in the lung (pulmonary embolism).  Pneumonia.  Cancer.  Liver failure (cirrhosis).  Kidney disease.  Complications from surgery, such as from open heart surgery. SIGNS AND SYMPTOMS In some cases, pleural effusion may cause no symptoms. Symptoms can include:  Shortness of breath, especially when lying down.  Chest pain, often worse when taking a deep breath.  Fever.  Dry cough that is lasting (chronic).  Hiccups.  Rapid breathing. An underlying condition that is causing the pleural effusion (such as heart failure, pneumonia, blood clots, tuberculosis, or cancer) may also cause additional symptoms. DIAGNOSIS Your health care provider may suspect  pleural effusion based on your symptoms and medical history. Your health care provider will also do a physical exam and a chest X-ray. If the X-ray shows there is fluid in your chest, you may need to have this fluid removed using a needle (thoracentesis) so it can be tested. You may also have:  Imaging studies of the chest, such as:  Ultrasound.  CT scan.  Blood tests for kidney and liver function. TREATMENT Treatment depends on the cause of the pleural effusion. Treatment may include:  Taking antibiotic medicines to clear up an infection that is causing the pleural effusion.  Placing a tube in the chest to drain the effusion (tube thoracostomy). This procedure is often used when there is an infection in the fluid.  Surgery to remove the fibrous outer layer of tissue from the pleural space (decortication).  Thoracentesis, which can improve cough and shortness of breath.  A procedure to put medicine into the chest cavity to seal the pleural space to prevent fluid buildup (pleurodesis).  Chemotherapy and radiation therapy. These may be required in the case of cancerous (malignant) pleural effusion. HOME CARE INSTRUCTIONS  Take medicines only as directed by your health care provider.  Keep track of how long you can gently exercise before you get short of breath. Try simply walking at first.  Do not use any tobacco products, including cigarettes, chewing tobacco, or electronic cigarettes. If you need help quitting, ask your health care provider.  Keep all follow-up visits as directed by your health care provider. This is important. SEEK MEDICAL CARE IF:  The amount of time that you are able to exercise  decreases or does not improve with time.  You have pain or signs of infection at the puncture site if you had thoracentesis. Watch for:  Drainage.  Redness.  Swelling.  You have a fever. SEEK IMMEDIATE MEDICAL CARE IF:  You are short of breath.  You develop chest  pain.  You develop a new cough. MAKE SURE YOU:  Understand these instructions.  Will watch your condition.  Will get help right away if you are not doing well or get worse.   This information is not intended to replace advice given to you by your health care provider. Make sure you discuss any questions you have with your health care provider.   Document Released: 12/13/2005 Document Revised: 01/03/2015 Document Reviewed: 05/08/2014 Elsevier Interactive Patient Education [employer].

## 2016-04-16 NOTE — ED Provider Notes (Addendum)
[facility] Emergency Department Provider Note  Time seen: 10:24 AM  I have reviewed the triage vital signs and the nursing notes.   HISTORY  Chief Complaint No chief complaint on file.    HPI Donald Henry is a 82 y.o. male with a past medical history of diabetes, pacemaker, hypertension, CK D, BPH, presents to the emergency department with shortness of breath. According to the patient he has a history of a pleural effusion on the right side, every few weeks he has needed to have fluid drawn off of his lung. He states the last episode was in the emergency department 04/02/16.  Patient states for the past week he has once again been feeling short of breath, much worse with exertion. Patient states he typically realizes that shortness of breath when walking to the mailbox to get his mail. Patient states for the past few days he has been getting extremely short of breath when walking to his mailbox so he came to the emergency department today for evaluation. Patient denies any chest pain. Denies any leg pain or swelling. Patient denies nausea. Patient denies vomiting. Patient states his shortness breath is significantly increased over the past one week.    Past Medical History  Diagnosis Date  . Diabetes mellitus without complication ([place])   . SSS (sick sinus syndrome) (HCC)     a. s/p MDT ADSRO1 Single Lead PPM.  . Myocardial infarction [facility])     a. MI @ age 89-->conservatively managed.  Has never had a cath.  Reports multiple negative stress tests.  . Ischemic cardiomyopathy   . Chronic combined systolic (congestive) and diastolic (congestive) heart failure ([place])     a. 01/2015 Echo: EF 40%, mod MR/PR/TR;  b. 10/2015 Echo: EF 20-25%, sev MR, sev dil LA/RA, sev TR.  Marland Kitchen Hypertensive heart disease   . Pulmonary embolism ([place])     a. 10/2015 ->Eliquis.  . Normocytic anemia   . Peptic ulcer disease   . Obesity   . CKD (chronic kidney disease), stage III   . BPH  (benign prostatic hyperplasia)   . Recurrent right pleural effusion     a. 01/28/2016 1.7L fluid removal via thoracentesis; b. 02/19/2016 2.4L removal via thoracentesis.  . Severe mitral regurgitation     a. 10/2015 Echo: sev MR.  . Hiatal hernia     Patient Active Problem List   Diagnosis Date Noted  . Mitral regurgitation 03/03/2016  . Hyperlipidemia 03/03/2016  . Acute on chronic systolic congestive heart failure ([place]) 03/03/2016  . Secondary cardiomyopathy ([place]) 03/03/2016  . Pleural effusion 02/19/2016  . Acute pulmonary embolism ([place]) 11/18/2015  . Atrial fibrillation ([place]) 11/18/2015  . Benign essential HTN 05/22/2015  . Chronic atrial fibrillation ([place]) 01/14/2015  . Arteriosclerosis of coronary artery 06/25/2014  . Sick sinus syndrome ([place]) 06/25/2014    Past Surgical History  Procedure Laterality Date  . Insert / replace / remove pacemaker      Medtronic  Serial # [id number]    Current Outpatient Rx  Name  Route  Sig  Dispense  Refill  . apixaban (ELIQUIS) 2.5 MG TABS tablet   Oral   Take 1 tablet (2.5 mg total) by mouth 2 (two) times daily.   60 tablet   0   . atorvastatin (LIPITOR) 40 MG tablet   Oral   Take 1 tablet (40 mg total) by mouth daily.   30 tablet   0   . carvedilol (COREG) 6.25 MG tablet  Oral   Take 1 tablet (6.25 mg total) by mouth 2 (two) times daily.   30 tablet   6   . chlorthalidone (HYGROTON) 25 MG tablet   Oral   Take 25 mg by mouth daily.         Marland Kitchen glipiZIDE (GLUCOTROL) 10 MG tablet   Oral   Take 10 mg by mouth daily.          . isosorbide mononitrate (IMDUR) 30 MG 24 hr tablet   Oral   Take 30 mg by mouth daily.         Marland Kitchen terazosin (HYTRIN) 2 MG capsule   Oral   Take 2 mg by mouth at bedtime.         . torsemide (DEMADEX) 20 MG tablet   Oral   Take 2 tablets (40 mg total) by mouth daily.   60 tablet   6   . traMADol (ULTRAM) 50 MG tablet   Oral   Take 1 tablet (50 mg total) by mouth every 6  (six) hours as needed. Patient taking differently: Take 50 mg by mouth every 6 (six) hours as needed for moderate pain.    20 tablet   0   . vitamin B-12 (CYANOCOBALAMIN) 1000 MCG tablet   Oral   Take 1,000 mcg by mouth daily.         Marland Kitchen zolpidem (AMBIEN) 5 MG tablet   Oral   Take 5 mg by mouth at bedtime as needed for sleep.           Allergies Aspirin  Family History  Problem Relation Age of Onset  . Heart disease Mother   . Heart disease Father     a. First MI @ 42.  . Leukemia Sister   . Diabetes Mellitus II Sister   . Diabetes Mellitus II Brother   . Heart attack Son     a. MI @ 68  . Heart attack Son     a. MI @ 66.    Social History Social History  Substance Use Topics  . Smoking status: Never Smoker   . Smokeless tobacco: Not on file  . Alcohol Use: No    Review of Systems Constitutional: Negative for fever. Cardiovascular: Negative for chest pain. Respiratory: Positive for shortness of breath. Gastrointestinal: Negative for abdominal pain, vomiting Musculoskeletal: Negative for back pain. Neurological: Negative for headache 10-point ROS otherwise negative.  ____________________________________________   PHYSICAL EXAM:  Constitutional: Alert and oriented. Well appearing and in no distress. Eyes: Normal exam ENT   Head: Normocephalic and atraumatic.   Mouth/Throat: Mucous membranes are moist. Cardiovascular: Normal rate, regular rhythm. No murmur Respiratory: Normal respiratory effort without tachypnea nor retractions. Breath sounds are clear  Gastrointestinal: Soft and nontender. No distention.  Musculoskeletal: Nontender with normal range of motion in all extremities. No lower extremity tenderness or edema. Neurologic:  Normal speech and language. No gross focal neurologic deficits Skin:  Skin is warm, dry and intact.  Psychiatric: Mood and affect are normal. Speech and behavior are normal.   ____________________________________________    EKG  EKG reviewed and interpreted by myself shows a ventricular paced rhythm at 64 bpm. Widened QRS consistent with paced rhythm, nonspecific ST changes.  ____________________________________________    RADIOLOGY  Chest x-ray shows large right-sided pleural effusion.  ____________________________________________    INITIAL IMPRESSION / ASSESSMENT AND PLAN / ED COURSE  Pertinent labs & imaging results that were available during my care of the patient were reviewed by  me and considered in my medical decision making (see chart for details).  Presents the emergency department dyspnea on exertion. Patient has a history of a right-sided pleural effusion requiring thoracentesis in the past, the last which was 04/02/16. We will check labs, EKG, chest x-ray. Patient is in no distress, 95-96 percent room air saturation.   Patient has a large right-sided pleural effusion. I discussed the patient with interventional radiology, and they will drain the pleural effusion on the emergency department. Afterwards the patient will be discharged home and can follow up with Dr. Raul Del on Monday. I discussed the patient with Dr. Raul Del, who is currently attempting to get the patient and with Dr. Faith Rogue for a drain to be placed. Patient will be discharged after thoracentesis as long as he is feeling well.   Patient has returned from thoracentesis. No complaints. Repeat chest x-ray negative for pneumothorax, shows resolution of pleural effusion. We'll discharge patient home with follow-up with Dr. Raul Del on Monday. Patient agreeable to plan.   ____________________________________________   FINAL CLINICAL IMPRESSION(S) / ED DIAGNOSES  Dyspnea Right pleural effusion  Harvest Dark, MD 04/16/16 1535  Harvest Dark, MD 04/16/16 1558

## 2016-04-16 NOTE — ED Notes (Signed)
States started feeling short of breath this morning when he was walking back up his driveway after picking up the newspaper.  Patient states he has been feeling short of breath for the past couple of weeks with symptoms worsening.

## 2016-04-16 NOTE — ED Notes (Signed)
Patient to ultrasound for thoracentesis.

## 2016-04-19 ENCOUNTER — Other Ambulatory Visit (INDEPENDENT_AMBULATORY_CARE_PROVIDER_SITE_OTHER): Payer: Medicare Other | Admitting: *Deleted

## 2016-04-19 DIAGNOSIS — I4891 Unspecified atrial fibrillation: Secondary | ICD-10-CM

## 2016-04-20 ENCOUNTER — Telehealth: Payer: Self-pay | Admitting: Cardiology

## 2016-04-20 ENCOUNTER — Other Ambulatory Visit: Payer: Self-pay | Admitting: *Deleted

## 2016-04-20 DIAGNOSIS — I4891 Unspecified atrial fibrillation: Secondary | ICD-10-CM

## 2016-04-20 LAB — BASIC METABOLIC PANEL
BUN/Creatinine Ratio: 32 — ABNORMAL HIGH (ref 10–24)
BUN: 68 mg/dL — AB (ref 8–27)
CALCIUM: 8.7 mg/dL (ref 8.6–10.2)
CO2: 33 mmol/L — ABNORMAL HIGH (ref 18–29)
Chloride: 94 mmol/L — ABNORMAL LOW (ref 96–106)
Creatinine, Ser: 2.12 mg/dL — ABNORMAL HIGH (ref 0.76–1.27)
GFR, EST AFRICAN AMERICAN: 32 mL/min/{1.73_m2} — AB (ref >59)
GFR, EST NON AFRICAN AMERICAN: 28 mL/min/{1.73_m2} — AB (ref >59)
Glucose: 161 mg/dL — ABNORMAL HIGH (ref 65–99)
Potassium: 5.3 mmol/L — ABNORMAL HIGH (ref 3.5–5.2)
Sodium: 140 mmol/L (ref 134–144)

## 2016-04-20 MED ORDER — TORSEMIDE 20 MG PO TABS: 20.0000 mg | ORAL_TABLET | Freq: Every day | ORAL | Status: AC

## 2016-04-20 NOTE — Telephone Encounter (Signed)
-----   Message from Wende Bushy, MD sent at 04/20/2016  9:03 AM EDT ----- rec to decrease torsemide to 20mg  po qd (due to worsening of kidney function). Will need to make sure he has consult with nephrology soon.

## 2016-04-20 NOTE — Telephone Encounter (Signed)
Reviewed lab results with patient and confirmed that he still has upcoming appointment to see a nephrologist. Instructed him to decrease his torsemide to 20 mg once daily and to make sure to keep that upcoming appointment. Patient verbalized understanding of all instructions, results, change in medication, and had no further questions at this time. Let him know to call back if needed.

## 2016-04-20 NOTE — Telephone Encounter (Signed)
Returning vm from [place].  Please call back.

## 2016-04-21 ENCOUNTER — Ambulatory Visit: Payer: Medicare Other | Admitting: Cardiology

## 2016-04-22 ENCOUNTER — Emergency Department: Payer: Medicare Other

## 2016-04-22 ENCOUNTER — Inpatient Hospital Stay: Payer: Medicare Other | Admitting: Cardiothoracic Surgery

## 2016-04-22 ENCOUNTER — Emergency Department
Admission: EM | Admit: 2016-04-22 | Discharge: 2016-04-22 | Disposition: A | Discharge status: Home / Self Care | Payer: Medicare Other | Attending: Emergency Medicine | Admitting: Emergency Medicine

## 2016-04-22 ENCOUNTER — Encounter: Payer: Self-pay | Admitting: Emergency Medicine

## 2016-04-22 DIAGNOSIS — I2510 Atherosclerotic heart disease of native coronary artery without angina pectoris: Secondary | ICD-10-CM | POA: Insufficient documentation

## 2016-04-22 DIAGNOSIS — J90 Pleural effusion, not elsewhere classified: Secondary | ICD-10-CM | POA: Insufficient documentation

## 2016-04-22 DIAGNOSIS — N183 Chronic kidney disease, stage 3 (moderate): Secondary | ICD-10-CM | POA: Diagnosis not present

## 2016-04-22 DIAGNOSIS — I4891 Unspecified atrial fibrillation: Secondary | ICD-10-CM | POA: Insufficient documentation

## 2016-04-22 DIAGNOSIS — E785 Hyperlipidemia, unspecified: Secondary | ICD-10-CM | POA: Insufficient documentation

## 2016-04-22 DIAGNOSIS — I255 Ischemic cardiomyopathy: Secondary | ICD-10-CM | POA: Diagnosis not present

## 2016-04-22 DIAGNOSIS — I252 Old myocardial infarction: Secondary | ICD-10-CM | POA: Diagnosis not present

## 2016-04-22 DIAGNOSIS — I5042 Chronic combined systolic (congestive) and diastolic (congestive) heart failure: Secondary | ICD-10-CM | POA: Insufficient documentation

## 2016-04-22 DIAGNOSIS — Z7984 Long term (current) use of oral hypoglycemic drugs: Secondary | ICD-10-CM | POA: Diagnosis not present

## 2016-04-22 DIAGNOSIS — R0602 Shortness of breath: Secondary | ICD-10-CM | POA: Diagnosis present

## 2016-04-22 DIAGNOSIS — I130 Hypertensive heart and chronic kidney disease with heart failure and stage 1 through stage 4 chronic kidney disease, or unspecified chronic kidney disease: Secondary | ICD-10-CM | POA: Insufficient documentation

## 2016-04-22 DIAGNOSIS — R0600 Dyspnea, unspecified: Secondary | ICD-10-CM

## 2016-04-22 DIAGNOSIS — E1122 Type 2 diabetes mellitus with diabetic chronic kidney disease: Secondary | ICD-10-CM | POA: Diagnosis not present

## 2016-04-22 DIAGNOSIS — E669 Obesity, unspecified: Secondary | ICD-10-CM | POA: Insufficient documentation

## 2016-04-22 DIAGNOSIS — Z79899 Other long term (current) drug therapy: Secondary | ICD-10-CM | POA: Diagnosis not present

## 2016-04-22 LAB — CBC
HCT: 28.3 % — ABNORMAL LOW (ref 40.0–52.0)
Hemoglobin: 9.5 g/dL — ABNORMAL LOW (ref 13.0–18.0)
MCH: 31.3 pg (ref 26.0–34.0)
MCHC: 33.7 g/dL (ref 32.0–36.0)
MCV: 93.1 fL (ref 80.0–100.0)
PLATELETS: 176 10*3/uL (ref 150–440)
RBC: 3.05 MIL/uL — AB (ref 4.40–5.90)
RDW: 18.5 % — AB (ref 11.5–14.5)
WBC: 7.3 10*3/uL (ref 3.8–10.6)

## 2016-04-22 LAB — BASIC METABOLIC PANEL
Anion gap: 9 (ref 5–15)
BUN: 73 mg/dL — ABNORMAL HIGH (ref 6–20)
CALCIUM: 8.9 mg/dL (ref 8.9–10.3)
CO2: 30 mmol/L (ref 22–32)
CREATININE: 2.2 mg/dL — AB (ref 0.61–1.24)
Chloride: 96 mmol/L — ABNORMAL LOW (ref 101–111)
GFR, EST AFRICAN AMERICAN: 30 mL/min — AB (ref >60)
GFR, EST NON AFRICAN AMERICAN: 26 mL/min — AB (ref >60)
Glucose, Bld: 255 mg/dL — ABNORMAL HIGH (ref 65–99)
Potassium: 4.3 mmol/L (ref 3.5–5.1)
SODIUM: 135 mmol/L (ref 135–145)

## 2016-04-22 LAB — TROPONIN I: TROPONIN I: 0.03 ng/mL (ref <0.031)

## 2016-04-22 NOTE — Progress Notes (Deleted)
Subjective:     Patient ID: Donald Henry, male   DOB: 01-27-1930, 82 y.o.   MRN: [id number]  HPI   Review of Systems     Objective:   Physical Exam     Assessment:     ***    Plan:     ***

## 2016-04-22 NOTE — Progress Notes (Signed)
Subjective:     Patient ID: Donald Henry, male   DOB: March 18, 1930, 82 y.o.   MRN: [id number]  HPI  Review of Systems     Objective:   Physical Exam     Assessment:         Plan:

## 2016-04-22 NOTE — Procedures (Signed)
Successful RT THORACENTESIS 2L removed No comp Stable cxr pending Full report in PACS

## 2016-04-22 NOTE — Discharge Instructions (Signed)
Please call back to Dr. Gust Brooms office tomorrow morning to set up close follow-up.  Pleural Effusion A pleural effusion is an abnormal buildup of fluid in the layers of tissue between your lungs and the inside of your chest (pleural space). These two layers of tissue that line both your lungs and the inside of your chest are called pleura. Usually, there is no air in the space between the pleura, only a thin layer of fluid. If left untreated, a large amount of fluid can build up and cause the lung to collapse. A pleural effusion is usually caused by another disease that requires treatment. The two main types of pleural effusion are:  Transudative pleural effusion. This happens when fluid leaks into the pleural space because of a low protein count in your blood or high blood pressure in your vessels. Heart failure often causes this.  Exudative infusion. This occurs when fluid collects in the pleural space from blocked blood vessels or lymph vessels. Some lung diseases, injuries, and cancers can cause this type of effusion. CAUSES Pleural effusion can be caused by:  Heart failure.  A blood clot in the lung (pulmonary embolism).  Pneumonia.  Cancer.  Liver failure (cirrhosis).  Kidney disease.  Complications from surgery, such as from open heart surgery. SIGNS AND SYMPTOMS In some cases, pleural effusion may cause no symptoms. Symptoms can include:  Shortness of breath, especially when lying down.  Chest pain, often worse when taking a deep breath.  Fever.  Dry cough that is lasting (chronic).  Hiccups.  Rapid breathing. An underlying condition that is causing the pleural effusion (such as heart failure, pneumonia, blood clots, tuberculosis, or cancer) may also cause additional symptoms. DIAGNOSIS Your health care provider may suspect pleural effusion based on your symptoms and medical history. Your health care provider will also do a physical exam and a chest X-ray. If the  X-ray shows there is fluid in your chest, you may need to have this fluid removed using a needle (thoracentesis) so it can be tested. You may also have:  Imaging studies of the chest, such as:  Ultrasound.  CT scan.  Blood tests for kidney and liver function. TREATMENT Treatment depends on the cause of the pleural effusion. Treatment may include:  Taking antibiotic medicines to clear up an infection that is causing the pleural effusion.  Placing a tube in the chest to drain the effusion (tube thoracostomy). This procedure is often used when there is an infection in the fluid.  Surgery to remove the fibrous outer layer of tissue from the pleural space (decortication).  Thoracentesis, which can improve cough and shortness of breath.  A procedure to put medicine into the chest cavity to seal the pleural space to prevent fluid buildup (pleurodesis).  Chemotherapy and radiation therapy. These may be required in the case of cancerous (malignant) pleural effusion. HOME CARE INSTRUCTIONS  Take medicines only as directed by your health care provider.  Keep track of how long you can gently exercise before you get short of breath. Try simply walking at first.  Do not use any tobacco products, including cigarettes, chewing tobacco, or electronic cigarettes. If you need help quitting, ask your health care provider.  Keep all follow-up visits as directed by your health care provider. This is important. SEEK MEDICAL CARE IF:  The amount of time that you are able to exercise decreases or does not improve with time.  You have pain or signs of infection at the puncture site if you  had thoracentesis. Watch for:  Drainage.  Redness.  Swelling.  You have a fever. SEEK IMMEDIATE MEDICAL CARE IF:  You are short of breath.  You develop chest pain.  You develop a new cough. MAKE SURE YOU:  Understand these instructions.  Will watch your condition.  Will get help right away if you  are not doing well or get worse.   This information is not intended to replace advice given to you by your health care provider. Make sure you discuss any questions you have with your health care provider.   Document Released: 12/13/2005 Document Revised: 01/03/2015 Document Reviewed: 05/08/2014 Elsevier Interactive Patient Education [employer].

## 2016-04-22 NOTE — ED Notes (Signed)
Pt arrived via EMS for complaints of shortness of breath. Pt reports had fluid drawn off his lungs two weeks ago and must have this done frequently. Pt denies chest pain but states he feels like he cant breathe. Pt speaking in complete sentences without difficulty. EMS reports 97% RA SpO2, VSS. Paced rhythm on monitor. CBG 266.

## 2016-04-22 NOTE — ED Notes (Signed)
Patient transported to Ultrasound 

## 2016-04-22 NOTE — ED Provider Notes (Signed)
[facility] Emergency Department Provider Note  ____________________________________________  Time seen: Approximately 2:28 PM  I have reviewed the triage vital signs and the nursing notes.   HISTORY  Chief Complaint Shortness of Breath    HPI Donald Henry is a 82 y.o. male previous history of pacemaker, right sided pleural effusions. Currently reports continuing to take his oral anticoagulant.  Patient reports that he's redeveloped same symptoms as previous and that he needs "fluid taken off the right lung". He reports the same symptoms many times has had follow-up with both pulmonary Dr. Raul Del and is supposed to see Dr. Faith Rogue of thoracic surgery, but is not yet received an appointment day.  Denies any fevers chills or chest pain. Reports mild to moderate shortness of breath, worse with exertion. States same as previous.  No abdominal pain.   Past Medical History  Diagnosis Date  . Diabetes mellitus without complication ([place])   . SSS (sick sinus syndrome) (H[facility])     a. s/p MDT ADSRO1 Single Lead PPM.  . Myocardial infarction [facility])     a. MI @ age 64-->conservatively managed.  Has never had a cath.  Reports multiple negative stress tests.  . Ischemic cardiomyopathy   . Chronic combined systolic (congestive) and diastolic (congestive) heart failure ([place])     a. 01/2015 Echo: EF 40%, mod MR/PR/TR;  b. 10/2015 Echo: EF 20-25%, sev MR, sev dil LA/RA, sev TR.  Marland Kitchen Hypertensive heart disease   . Pulmonary embolism ([place])     a. 10/2015 ->Eliquis.  . Normocytic anemia   . Peptic ulcer disease   . Obesity   . CKD (chronic kidney disease), stage III   . BPH (benign prostatic hyperplasia)   . Recurrent right pleural effusion     a. 01/28/2016 1.7L fluid removal via thoracentesis; b. 02/19/2016 2.4L removal via thoracentesis.  . Severe mitral regurgitation     a. 10/2015 Echo: sev MR.  . Hiatal hernia     Patient Active Problem List   Diagnosis Date  Noted  . Mitral regurgitation 03/03/2016  . Hyperlipidemia 03/03/2016  . Acute on chronic systolic congestive heart failure ([place]) 03/03/2016  . Secondary cardiomyopathy ([place]) 03/03/2016  . Pleural effusion 02/19/2016  . Acute pulmonary embolism ([place]) 11/18/2015  . Atrial fibrillation ([place]) 11/18/2015  . Benign essential HTN 05/22/2015  . Chronic atrial fibrillation ([place]) 01/14/2015  . Arteriosclerosis of coronary artery 06/25/2014  . Sick sinus syndrome ([place]) 06/25/2014    Past Surgical History  Procedure Laterality Date  . Insert / replace / remove pacemaker      [employer]  Serial # [id number]    Current Outpatient Rx  Name  Route  Sig  Dispense  Refill  . apixaban (ELIQUIS) 5 MG TABS tablet   Oral   Take 5 mg by mouth 2 (two) times daily.         Marland Kitchen atorvastatin (LIPITOR) 40 MG tablet   Oral   Take 1 tablet (40 mg total) by mouth daily.   30 tablet   0   . carvedilol (COREG) 6.25 MG tablet   Oral   Take 1 tablet (6.25 mg total) by mouth 2 (two) times daily.   30 tablet   6   . chlorthalidone (HYGROTON) 25 MG tablet   Oral   Take 25 mg by mouth daily.         . furosemide (LASIX) 20 MG tablet   Oral   Take 20 mg by mouth daily.         Marland Kitchen  glipiZIDE (GLUCOTROL) 10 MG tablet   Oral   Take 10 mg by mouth daily before breakfast.         . isosorbide mononitrate (IMDUR) 30 MG 24 hr tablet   Oral   Take 30 mg by mouth daily.         Marland Kitchen LORazepam (ATIVAN) 0.5 MG tablet   Oral   Take 0.5 mg by mouth every 8 (eight) hours as needed for anxiety.         Marland Kitchen terazosin (HYTRIN) 2 MG capsule   Oral   Take 2 mg by mouth at bedtime.         . torsemide (DEMADEX) 20 MG tablet   Oral   Take 1 tablet (20 mg total) by mouth daily.   30 tablet   6   . traMADol (ULTRAM) 50 MG tablet   Oral   Take 50 mg by mouth every 6 (six) hours as needed.         . vitamin B-12 (CYANOCOBALAMIN) 1000 MCG tablet   Oral   Take 1,000 mcg by mouth daily.          Marland Kitchen zolpidem (AMBIEN) 5 MG tablet   Oral   Take 5 mg by mouth at bedtime as needed for sleep.           Allergies Aspirin  Family History  Problem Relation Age of Onset  . Heart disease Mother   . Heart disease Father     a. First MI @ 39.  . Leukemia Sister   . Diabetes Mellitus II Sister   . Diabetes Mellitus II Brother   . Heart attack Son     a. MI @ 37  . Heart attack Son     a. MI @ 104.    Social History Social History  Substance Use Topics  . Smoking status: Never Smoker   . Smokeless tobacco: None  . Alcohol Use: No    Review of Systems Constitutional: No fever/chills Eyes: No visual changes. ENT: No sore throat. Cardiovascular: Denies chest pain. Respiratory:No cough, reports shortness of breath primarily worse with exertion. No wheezing. strointestinal: No abdominal pain.  No nausea, no vomiting.  No diarrhea.  No constipation. Genitourinary: Negative for dysuria. Musculoskeletal: Negative for back pain. Skin: Negative for rash. Neurological: Negative for headaches, focal weakness or numbness.  10-point ROS otherwise negative.  ____________________________________________   PHYSICAL EXAM:  VITAL SIGNS: ED Triage Vitals  Enc Vitals Group     BP 04/22/16 1229 118/68 mmHg     Pulse Rate 04/22/16 1229 69     Resp 04/22/16 1229 18     Temp 04/22/16 1229 98.7 F (37.1 C)     Temp Source 04/22/16 1229 Oral     SpO2 04/22/16 1229 97 %     Weight 04/22/16 1229 167 lb (75.751 kg)     Height 04/22/16 1229 5\' 7"  (1.702 m)     Head Cir --      Peak Flow --      Pain Score 04/22/16 1230 0     Pain Loc --      Pain Edu? --      Excl. in [place]? --    Constitutional: Alert and oriented. Well appearing and in no acute distress. Eyes: Conjunctivae are normal. PERRL. EOMI. Head: Atraumatic. Nose: No congestion/rhinnorhea. Mouth/Throat: Mucous membranes are moist.  Oropharynx non-erythematous. Neck: No stridor.   Cardiovascular: Normal rate,  regular rhythm. Grossly normal heart sounds.  Good  peripheral circulation. Respiratory: Normal respiratory effort.  No retractions. Lungs CTAB although slightly diminished over the right base. No wheezing. He speaks in full and normal sentences with normal respiratory effort.. Gastrointestinal: Soft and nontender. No distention. No abdominal bruits. No CVA tenderness. Musculoskeletal: No lower extremity tenderness nor edema.  No joint effusions. Neurologic:  Normal speech and language. No gross focal neurologic deficits are appreciated. No gait instability. Skin:  Skin is warm, dry and intact. No rash noted. Psychiatric: Mood and affect are normal. Speech and behavior are normal.  ____________________________________________   LABS (all labs ordered are listed, but only abnormal results are displayed)  Labs Reviewed  BASIC METABOLIC PANEL - Abnormal; Notable for the following:    Chloride 96 (*)    Glucose, Bld 255 (*)    BUN 73 (*)    Creatinine, Ser 2.20 (*)    GFR calc non Af Amer 26 (*)    GFR calc Af Amer 30 (*)    All other components within normal limits  CBC - Abnormal; Notable for the following:    RBC 3.05 (*)    Hemoglobin 9.5 (*)    HCT 28.3 (*)    RDW 18.5 (*)    All other components within normal limits  TROPONIN I   ____________________________________________  EKG  Reviewed and interpreted by me at 12:30 PM Heart rate 76 Ventricular pacing No evidence of acute ST elevation MI/scarbosa or obvious ischemia. QTC 500 Care is 160 PR 170 Reviewed and interpreted as ventricular paced ____________________________________________  RADIOLOGY   DG Chest Port 1 View (Final result) Result time: 04/22/16 13:42:50   Final result by Rad Results In Interface (04/22/16 13:42:50)   Narrative:   CLINICAL DATA: Shortness of Breath  EXAM: PORTABLE CHEST 1 VIEW  COMPARISON: 04/16/2016  FINDINGS: Cardiomediastinal silhouette is stable. There is small right  pleural effusion with right basilar atelectasis or infiltrate. No pulmonary edema. Single lead cardiac pacemaker is unchanged in position.  IMPRESSION: Small right pleural effusion with right basilar atelectasis or infiltrate. No pulmonary edema.   Electronically Signed By: Lahoma Crocker M.D. On: 04/22/2016 13:42    ____________________________________________   PROCEDURES  Procedure(s) performed: None  Critical Care performed: No  ____________________________________________   INITIAL IMPRESSION / ASSESSMENT AND PLAN / ED COURSE  Pertinent labs & imaging results that were available during my care of the patient were reviewed by me and considered in my medical decision making (see chart for details).  Patient presents with recurrent mild exertional dyspnea. He does have notable recurrent right-sided pleural effusion. He is not requiring oxygen, and his work of breathing is normal at this time. This appears to be related to his recurrent episodes of pleural effusion, and he does not have any overt infectious symptoms. He is resting comfortably at this time but requesting to have fluid removed for the line didn't for symptomatic relief.  Medicine this case again with Dr. Raul Del his pulmonologist. He advises that the patient was supposed to see Dr. Faith Rogue last week, but evidently did not make the appointment. Dr. Gust Brooms office will be contacting him, and Dr. Raul Del advises that we should consult interventional radiology for therapeutic thoracentesis again today. The patient is agreeable with this plan.  Denies any cardiac symptoms. Labs reassuring.  ----------------------------------------- 3:59 PM on 04/22/2016 -----------------------------------------  Discussed with patient and his family, they will call Dr. Gust Brooms office to clarify ongoing plan for his pleural effusions. Ongoing care assigned to Dr. Marcelene Butte, follow-up post thoracentesis film and reassess.  Patient  doing well, no pneumothorax, and stable recommend discharge to home to follow up closely with pulmonology. ____________________________________________   FINAL CLINICAL IMPRESSION(S) / ED DIAGNOSES  Final diagnoses:  Dyspnea  Recurrent pleural effusion on right      Delman Kitten, MD 04/22/16 1600

## 2016-04-22 NOTE — Progress Notes (Signed)
Inpatient Diabetes Program Recommendations  AACE/ADA: New Consensus Statement on Inpatient Glycemic Control (2015)  Target Ranges:  Prepandial:   less than 140 mg/dL      Peak postprandial:   less than 180 mg/dL (1-2 hours)      Critically ill patients:  140 - 180 mg/dL    Diabetes history: Type 2 Outpatient Diabetes medications: Glipizide 10mg  qday Current orders for Inpatient glycemic control: none Inpatient Diabetes Program Recommendations:  Please consider checking blood sugars tid/hs.  Consider ordering Novolog correction 0-9 units tid   Gentry Fitz, RN, [place], [place], CDE Diabetes Coordinator Inpatient Diabetes Program  [phone] (Team Pager) [phone] ([place]) 04/22/2016 2:51 PM

## 2016-04-22 NOTE — ED Notes (Signed)
Discussed discharge instructions and follow-up care with patient. No questions or concerns at this time. Pt stable at discharge.  

## 2016-04-22 NOTE — ED Provider Notes (Signed)
-----------------------------------------   4:52 PM on 04/22/2016 -----------------------------------------   Blood pressure 123/68, pulse 40, temperature 98.7 F (37.1 C), temperature source Oral, resp. rate 16, height 5\' 7"  (1.702 m), weight 167 lb (75.751 kg), SpO2 95 %.  Assuming care from Dr. Jacqualine Code.  In short, CRUSE HEINZERLING is a 82 y.o. male with a chief complaint of Shortness of Breath .  Refer to the original H&P for additional details.  The current plan of care is to patient tolerated his pleural effusion removal well with no follow-up significant abnormalities on his chest x-ray. No pneumothorax present in his pulse ox is remained stable. As per Dr. Jacqualine Code plan we'll discharge the patient to follow up with his pulmonologist and establish routine pleurocentesis   Daymon Larsen, MD 04/22/16 [phone]

## 2016-04-27 ENCOUNTER — Other Ambulatory Visit: Payer: Self-pay | Admitting: Specialist

## 2016-04-27 DIAGNOSIS — J90 Pleural effusion, not elsewhere classified: Secondary | ICD-10-CM

## 2016-04-28 ENCOUNTER — Ambulatory Visit
Admission: RE | Admit: 2016-04-28 | Discharge: 2016-04-28 | Disposition: A | Discharge status: Home / Self Care | Payer: Medicare Other | Source: Ambulatory Visit | Attending: Specialist | Admitting: Specialist

## 2016-04-28 ENCOUNTER — Other Ambulatory Visit: Payer: Self-pay | Admitting: Diagnostic Radiology

## 2016-04-28 DIAGNOSIS — J90 Pleural effusion, not elsewhere classified: Secondary | ICD-10-CM

## 2016-04-28 HISTORY — PX: IR GENERIC HISTORICAL: IMG1180011

## 2016-04-28 NOTE — Consult Note (Signed)
Chief Complaint: Patient was seen in consultation today for placement of tunneled pleural catheter. Chief Complaint  Patient presents with  . Advice Only   at the request of Fleming,Herbon E  Referring Physician(s): Fleming,Herbon E   History of Present Illness: Donald Henry is a 82 y.o. male with history of congestive heart failure and recurrent right pleural effusion. Patient underwent ultrasound-guided thoracentesis 3 times in April 2017. On each occasion, approximately 2 L of fluid was removed. Thoracentesis procedures were performed for shortness of breath and patient said that he got immediate relief of the symptoms following the thoracentesis.  Patient does not complain of lower extremity swelling.  Past Medical History  Diagnosis Date  . Diabetes mellitus without complication ([place])   . SSS (sick sinus syndrome) ([facility])     a. s/p MDT ADSRO1 Single Lead PPM.  . Myocardial infarction [facility])     a. MI @ age 68-->conservatively managed.  Has never had a cath.  Reports multiple negative stress tests.  . Ischemic cardiomyopathy   . Chronic combined systolic (congestive) and diastolic (congestive) heart failure ([place])     a. 01/2015 Echo: EF 40%, mod MR/PR/TR;  b. 10/2015 Echo: EF 20-25%, sev MR, sev dil LA/RA, sev TR.  Marland Kitchen Hypertensive heart disease   . Pulmonary embolism ([place])     a. 10/2015 ->Eliquis.  . Normocytic anemia   . Peptic ulcer disease   . Obesity   . CKD (chronic kidney disease), stage III   . BPH (benign prostatic hyperplasia)   . Recurrent right pleural effusion     a. 01/28/2016 1.7L fluid removal via thoracentesis; b. 02/19/2016 2.4L removal via thoracentesis.  . Severe mitral regurgitation     a. 10/2015 Echo: sev MR.  . Hiatal hernia     Past Surgical History  Procedure Laterality Date  . Insert / replace / remove pacemaker      [employer]  Serial # [id number]    Allergies: Aspirin  Medications: Prior to Admission medications     Medication Sig Start Date End Date Taking? Authorizing Provider  apixaban (ELIQUIS) 5 MG TABS tablet Take 5 mg by mouth 2 (two) times daily.   Yes Historical Provider, MD  atorvastatin (LIPITOR) 40 MG tablet Take 1 tablet (40 mg total) by mouth daily. 02/20/16  Yes Vipul Manuella Ghazi, MD  carvedilol (COREG) 6.25 MG tablet Take 1 tablet (6.25 mg total) by mouth 2 (two) times daily. 03/22/16  Yes Wende Bushy, MD  chlorthalidone (HYGROTON) 25 MG tablet Take 25 mg by mouth daily.   Yes Historical Provider, MD  glipiZIDE (GLUCOTROL) 10 MG tablet Take 10 mg by mouth daily before breakfast.   Yes Historical Provider, MD  isosorbide mononitrate (IMDUR) 30 MG 24 hr tablet Take 30 mg by mouth daily.   Yes Historical Provider, MD  LORazepam (ATIVAN) 0.5 MG tablet Take 0.5 mg by mouth every 8 (eight) hours as needed for anxiety.   Yes Historical Provider, MD  terazosin (HYTRIN) 2 MG capsule Take 2 mg by mouth at bedtime.   Yes Historical Provider, MD  torsemide (DEMADEX) 20 MG tablet Take 1 tablet (20 mg total) by mouth daily. 04/20/16  Yes Wende Bushy, MD  vitamin B-12 (CYANOCOBALAMIN) 1000 MCG tablet Take 1,000 mcg by mouth daily.   Yes Historical Provider, MD  zolpidem (AMBIEN) 5 MG tablet Take 5 mg by mouth at bedtime as needed for sleep.   Yes Historical Provider, MD  furosemide (LASIX) 20 MG tablet Take 20  mg by mouth daily. Reported on 04/28/2016    Historical Provider, MD  traMADol (ULTRAM) 50 MG tablet Take 50 mg by mouth every 6 (six) hours as needed. Reported on 04/28/2016    Historical Provider, MD     Family History  Problem Relation Age of Onset  . Heart disease Mother   . Heart disease Father     a. First MI @ 57.  . Leukemia Sister   . Diabetes Mellitus II Sister   . Diabetes Mellitus II Brother   . Heart attack Son     a. MI @ 16  . Heart attack Son     a. MI @ 53.    Social History   Social History  . Marital Status: Married    Spouse Name: N/A  . Number of Children: N/A  . Years of  Education: N/A   Social History Main Topics  . Smoking status: Never Smoker   . Smokeless tobacco: Not on file  . Alcohol Use: No  . Drug Use: No  . Sexual Activity: Not on file   Other Topics Concern  . Not on file   Social History Narrative     Review of Systems  Respiratory: Positive for shortness of breath.   Cardiovascular: Negative for leg swelling.  Gastrointestinal: Negative for abdominal pain and abdominal distention.    Vital Signs: BP 128/61 mmHg  Pulse 70  Temp(Src) 97.7 F (36.5 C) (Oral)  Resp 14  Ht 5\' 7"  (1.702 m)  Wt 167 lb (75.751 kg)  BMI 26.15 kg/m2  SpO2 92%  Physical Exam  Cardiovascular: Normal rate, regular rhythm and normal heart sounds.   Pulmonary/Chest: No respiratory distress. He has no wheezes.  Decreased breath sounds in right lower chest.  Left lung is clear.   Abdominal: Soft. Bowel sounds are normal. He exhibits no distension. There is no tenderness.  Musculoskeletal: He exhibits no edema.       Imaging: Dg Chest 1 View  04/22/2016  CLINICAL DATA:  Post right thoracentesis. EXAM: CHEST 1 VIEW COMPARISON:  04/22/2016 FINDINGS: Right pleural fluid has been removed and there is improved aeration in the right lung. Negative for a right pneumothorax. Left lung remains clear. Stable position of the left single lead cardiac pacemaker. Heart size remains upper limits of normal. IMPRESSION: Improved aeration in right lung following right thoracentesis. Negative for pneumothorax. Electronically Signed   By: Markus Daft M.D.   On: 04/22/2016 16:34   Dg Chest 2 View  04/16/2016  CLINICAL DATA:  Post right-sided thoracentesis EXAM: CHEST  2 VIEW COMPARISON:  Earlier same day FINDINGS: Grossly unchanged enlarged cardiac silhouette and mediastinal contours given persistently reduced lung volumes. Stable position of support apparatus. Interval reduction/near resolution of residual trace right-sided effusion post thoracentesis. No pneumothorax. Improved  aeration of the right lower lung. No evidence of edema. No acute osseus abnormalities. IMPRESSION: Interval reduction / near resolution of residual trace effusion post thoracentesis. No pneumothorax. Electronically Signed   By: Sandi Mariscal M.D.   On: 04/16/2016 15:34   Dg Chest 2 View  04/16/2016  CLINICAL DATA:  Shortness of Breath, worsening over the last 2 days. EXAM: CHEST  2 VIEW COMPARISON:  04/02/2016 FINDINGS: Single lead pacer, lead tip projecting over the right ventricle. Large right pleural effusion. Right middle lobe airspace opacity. Passive atelectasis in the right lower lobe. Borderline cardiomegaly. Atherosclerotic aortic arch. Minimal blunting of the left posterior costophrenic angle. Left lateral ninth rib fracture, not changed from  February. Known right anterior second, third, fourth, and fifth rib fractures. IMPRESSION: 1. Large right and small left pleural effusions with associated passive atelectasis. 2. Right middle lobe airspace opacity, probably atelectasis, but pneumonia is not readily excluded. 3. Stable bilateral rib fractures. 4. Borderline cardiomegaly. Electronically Signed   By: Van Clines M.D.   On: 04/16/2016 11:54   Dg Chest 2 View  04/02/2016  CLINICAL DATA:  Status post right thoracentesis. EXAM: CHEST  2 VIEW COMPARISON:  04/02/2016 FINDINGS: Significant improvement in the right effusion following 2.175 L right thoracentesis. Small amount of right pleural fluid persist. Residual right middle and lower lobe atelectasis also evident. No pneumothorax. Heart is enlarged. No current CHF or edema. Left subclavian single lead pacer noted. Aorta is atherosclerotic. Left lung remains clear. Trachea is midline. IMPRESSION: Significant decrease in the right effusion following thoracentesis. No complicating feature. Electronically Signed   By: Jerilynn Mages.  Shick M.D.   On: 04/02/2016 14:38   Dg Chest 2 View  04/02/2016  CLINICAL DATA:  Worsening shortness of breath for several days.  EXAM: CHEST  2 VIEW COMPARISON:  February 19, 2016 FINDINGS: No pneumothorax. There is a right-sided pleural effusion with underlying opacity, worsened in the interval. Mild edema is identified. The cardiomediastinal silhouette is stable. No other interval changes. IMPRESSION: Increasing right-sided pleural effusion with underlying opacity. Mild edema. Recommend follow-up to resolution. Electronically Signed   By: Dorise Bullion III M.D   On: 04/02/2016 12:19   Dg Chest Port 1 View  04/22/2016  CLINICAL DATA:  Shortness of Breath EXAM: PORTABLE CHEST 1 VIEW COMPARISON:  04/16/2016 FINDINGS: Cardiomediastinal silhouette is stable. There is small right pleural effusion with right basilar atelectasis or infiltrate. No pulmonary edema. Single lead cardiac pacemaker is unchanged in position. IMPRESSION: Small right pleural effusion with right basilar atelectasis or infiltrate. No pulmonary edema. Electronically Signed   By: Lahoma Crocker M.D.   On: 04/22/2016 13:42   US Thoracentesis Asp Pleural Space W/img Guide  04/22/2016  INDICATION: Shortness of breath, difficulty breathing, recurrent large right effusion, chronic CHF EXAM: ULTRASOUND GUIDED RIGHT THORACENTESIS MEDICATIONS: 1% lidocaine locally COMPLICATIONS: None immediate. PROCEDURE: An ultrasound guided thoracentesis was thoroughly discussed with the patient and questions answered. The benefits, risks, alternatives and complications were also discussed. The patient understands and wishes to proceed with the procedure. Written consent was obtained. Ultrasound was performed to localize and mark an adequate pocket of fluid in the right chest. The area was then prepped and draped in the normal sterile fashion. 1% Lidocaine was used for local anesthesia. Under ultrasound guidance a 6 Fr Safe-T-Centesis catheter was introduced. Thoracentesis was performed. The catheter was removed and a dressing applied. FINDINGS: A total of approximately 2 L of clear pleural fluid  was removed. Samples were sent to the laboratory as requested by the clinical team. IMPRESSION: Successful ultrasound guided right thoracentesis yielding 2 L of pleural fluid. Electronically Signed   By: Jerilynn Mages.  Shick M.D.   On: 04/22/2016 16:06   US Thoracentesis Asp Pleural Space W/img Guide  04/16/2016  INDICATION: History of CHF with recurrent symptomatic right sided pleural effusion EXAM: US THORACENTESIS ASP PLEURAL SPACE W/IMG GUIDE COMPARISON:  Chest radiograph-earlier same day ; ultrasound-guided thoracentesis - 04/02/2016; 02/19/2016; 02/06/2016 MEDICATIONS: None. COMPLICATIONS: None immediate. TECHNIQUE: Informed written consent was obtained from the patient after a discussion of the risks, benefits and alternatives to treatment. A timeout was performed prior to the initiation of the procedure. Initial ultrasound scanning demonstrates a large anechoic  right-sided pleural effusion. The lower chest was prepped and draped in the usual sterile fashion. 1% lidocaine was used for local anesthesia. An ultrasound image was saved for documentation purposes. An 8 Fr Safe-T-Centesis catheter was introduced. The thoracentesis was performed yielding approximately 1.4 L. Postprocedural scanning demonstrated a residual moderate size effusion within the more basilar aspect of the right hemi thorax and decision was made to re- access the pleural space. Again, the right lower chest was prepped and draped in the usual sterile fashion. 1% lidocaine was used for local anesthesia. An ultrasound image was saved for documentation purposes. An 8 Fr Safe-T-Centesis catheter was introduced. The thoracentesis was performed yielding un other approximately 1 L of pleural fluid. The catheter was removed and a dressing was applied. The patient tolerated the procedure well without immediate post procedural complication. The patient was escorted to have an upright chest radiograph. FINDINGS: A total of approximately 2.4 liters of serous  fluid was removed. IMPRESSION: Successful ultrasound-guided right sided thoracentesis yielding 2.4 liters of pleural fluid. PLAN: Once medically optimized, as this is the 4th large volume thoracentesis performed since 02/06/2016 (approximately 2.2 L was aspirated on 04/02/2016, approximately 2.4 L aspirated on 02/19/2016 and approximately 1.7 L aspirated on 02/06/2016), consideration for placement of a palliative tunneled right-sided PleurX catheter could be considered as indicated. Electronically Signed   By: Sandi Mariscal M.D.   On: 04/16/2016 15:38   US Thoracentesis Asp Pleural Space W/img Guide  04/02/2016  INDICATION: CHF, recurrent right pleural effusion, shortness of breath EXAM: ULTRASOUND GUIDED RIGHT THORACENTESIS MEDICATIONS: 1% lidocaine locally COMPLICATIONS: None immediate. PROCEDURE: An ultrasound guided thoracentesis was thoroughly discussed with the patient and questions answered. The benefits, risks, alternatives and complications were also discussed. The patient understands and wishes to proceed with the procedure. Written consent was obtained. Ultrasound was performed to localize and mark an adequate pocket of fluid in the right chest. The area was then prepped and draped in the normal sterile fashion. 1% Lidocaine was used for local anesthesia. Under ultrasound guidance a 6 Fr Safe-T-Centesis catheter was introduced. Thoracentesis was performed. The catheter was removed and a dressing applied. FINDINGS: A total of approximately 2.175 L of clear pleural fluid was removed. IMPRESSION: Successful ultrasound guided right thoracentesis yielding 2.175 L of pleural fluid. Electronically Signed   By: Jerilynn Mages.  Shick M.D.   On: 04/02/2016 14:09    Labs:  CBC:  Recent Labs  02/20/16 0419 04/02/16 1052 04/16/16 1046 04/22/16 1232  WBC 7.5 8.1 6.3 7.3  HGB 9.5* 10.4* 8.8* 9.5*  HCT 27.8* 30.6* 25.4* 28.3*  PLT 182 195 171 176    COAGS:  Recent Labs  11/18/15 1618 04/02/16 1052  INR 1.37  1.50  APTT 37* 37*    BMP:  Recent Labs  04/02/16 1052 04/16/16 1046 04/19/16 0758 04/22/16 1232  NA 134* 135 140 135  K 3.6 4.2 5.3* 4.3  CL 97* 95* 94* 96*  CO2 30 32 33* 30  GLUCOSE 123* 304* 161* 255*  BUN 66* 77* 68* 73*  CALCIUM 9.0 8.6* 8.7 8.9  CREATININE 1.73* 2.18* 2.12* 2.20*  GFRNONAA 35* 26* 28* 26*  GFRAA 40* 30* 32* 30*    LIVER FUNCTION TESTS:  Recent Labs  11/18/15 1618  BILITOT 2.1*  AST 28  ALT 21  ALKPHOS 148*  PROT 7.1  ALBUMIN 3.7    TUMOR MARKERS: No results for input(s): AFPTM, CEA, CA199, CHROMGRNA in the last 8760 hours.  Assessment and Plan:   82 year old  male with multiple medical problems including congestive heart failure and recurrent right pleural effusion. The thoracentesis procedures are becoming more frequent. The patient wants to avoid having to go to the emergency department every time he feels shortness of breath for a thoracentesis. We discussed regular outpatient thoracentesis versus placement of a tunneled pleural catheter. We discussed the pleural catheter in depth including the procedure which would require moderate sedation and performed as an outpatient. Patient understands that a catheter may be needed indefinitely and may need to be changed in the future. Sometimes, the catheter will induce scarring of the pleural space and prevent reaccumulation. Otherwise, the patient may need a long-term catheter versus pleurodesis. There is some concern about managing this tube because the patient does live at home alone. The patient appears to be very functional and I think he would be able to manage the tube but he would like to think about the these options for a little bit. In the meantime, the patient feels that he is starting to get shortness of breath again and there appears to be re-accumulation of right pleural fluid based on my examination today. We will schedule the patient for an outpatient thoracentesis this upcoming Friday. I  am planning on performing the thoracentesis at [facility] for the patient. We can have further discussion of the options which include outpatient thoracentesis versus placement of a pleural catheter.    Thank you for this interesting consult.  I greatly enjoyed meeting DONAL ROHAL and look forward to participating in their care.  A copy of this report was sent to the requesting provider on this date.  Electronically Signed: Carylon Perches 04/28/2016, 12:00 PM   I spent a total of  15 Minutes   in face to face in clinical consultation, greater than 50% of which was counseling/coordinating care for recurrent right pleural effusion.

## 2016-04-28 NOTE — Discharge Instructions (Signed)
Thoracentesis, Care After °Refer to this sheet in the next few weeks. These instructions provide you with information about caring for yourself after your procedure. Your health care provider may also give you more specific instructions. Your treatment has been planned according to current medical practices, but problems sometimes occur. Call your health care provider if you have any problems or questions after your procedure. °WHAT TO EXPECT AFTER THE PROCEDURE °After your procedure, it is common to have pain at the puncture site. °HOME CARE INSTRUCTIONS °· Take medicines only as directed by your health care provider. °· You may return to your normal diet and normal activities as directed by your health care provider. °· Drink enough fluid to keep your urine clear or pale yellow. °· Do not take baths, swim, or use a hot tub until your health care provider approves. °· Follow your health care provider's instructions about: °¨ Puncture site care. °¨ Bandage (dressing) changes and removal. °· Check your puncture site every day for signs of infection. Watch for: °¨ Redness, swelling, or pain. °¨ Fluid, blood, or pus. °· Keep all follow-up visits as directed by your health care provider. This is important. °SEEK MEDICAL CARE IF: °· You have redness, swelling, or pain at your puncture site. °· You have fluid, blood, or pus coming from your puncture site. °· You have a fever. °· You have chills. °· You have nausea or vomiting. °· You have trouble breathing. °· You develop a worsening cough. °SEEK IMMEDIATE MEDICAL CARE IF: °· You have extreme shortness of breath. °· You develop chest pain. °· You faint or feel light-headed. °  °This information is not intended to replace advice given to you by your health care provider. Make sure you discuss any questions you have with your health care provider. °  °Document Released: 01/03/2015 Document Reviewed: 01/03/2015 °Elsevier Interactive Patient Education ©2016 [employer] ° °

## 2016-04-29 ENCOUNTER — Ambulatory Visit
Admission: RE | Admit: 2016-04-29 | Discharge: 2016-04-29 | Disposition: A | Discharge status: Home / Self Care | Payer: Medicare Other | Source: Ambulatory Visit | Attending: Diagnostic Radiology | Admitting: Diagnostic Radiology

## 2016-04-29 ENCOUNTER — Ambulatory Visit: Payer: Medicare Other

## 2016-04-29 DIAGNOSIS — J90 Pleural effusion, not elsewhere classified: Secondary | ICD-10-CM

## 2016-04-29 DIAGNOSIS — Z9889 Other specified postprocedural states: Secondary | ICD-10-CM | POA: Diagnosis not present

## 2016-04-29 NOTE — Procedures (Signed)
Interventional Radiology Procedure Note  Procedure:  US guided thora for symptomatic recurrent pleural effusion.   Complications: None Recommendations:  - Ok to shower tomorrow - Do not submerge for 7 days - Routine line care   Signed,  Dulcy Fanny. Earleen Newport, DO

## 2016-04-30 ENCOUNTER — Ambulatory Visit: Payer: Medicare Other

## 2016-05-03 ENCOUNTER — Other Ambulatory Visit: Payer: Self-pay | Admitting: Specialist

## 2016-05-03 DIAGNOSIS — J90 Pleural effusion, not elsewhere classified: Secondary | ICD-10-CM

## 2016-05-04 ENCOUNTER — Other Ambulatory Visit: Payer: Self-pay | Admitting: Specialist

## 2016-05-04 DIAGNOSIS — J90 Pleural effusion, not elsewhere classified: Secondary | ICD-10-CM

## 2016-05-05 ENCOUNTER — Ambulatory Visit
Admission: RE | Admit: 2016-05-05 | Discharge: 2016-05-05 | Disposition: A | Discharge status: Home / Self Care | Payer: Medicare Other | Source: Ambulatory Visit | Attending: Specialist | Admitting: Specialist

## 2016-05-05 DIAGNOSIS — J90 Pleural effusion, not elsewhere classified: Secondary | ICD-10-CM

## 2016-05-05 DIAGNOSIS — Z9889 Other specified postprocedural states: Secondary | ICD-10-CM | POA: Insufficient documentation

## 2016-05-05 LAB — BODY FLUID CELL COUNT WITH DIFFERENTIAL
Eos, Fluid: 0 %
LYMPHS FL: 64 %
Monocyte-Macrophage-Serous Fluid: 11 %
NEUTROPHIL FLUID: 25 %
OTHER CELLS FL: 0 %
WBC FLUID: 521 uL

## 2016-05-05 LAB — LACTATE DEHYDROGENASE, PLEURAL OR PERITONEAL FLUID: LD, Fluid: 118 U/L — ABNORMAL HIGH (ref 3–23)

## 2016-05-05 LAB — PROTEIN, BODY FLUID: Total protein, fluid: <3 g/dL

## 2016-05-05 LAB — GLUCOSE, SEROUS FLUID: Glucose, Fluid: 190 mg/dL

## 2016-05-05 NOTE — Procedures (Signed)
US guided right thoracentesis.  Removed 2.0 liters of yellow fluid.  No immediate complication.  Minimal blood loss.  CXR pending.

## 2016-05-06 ENCOUNTER — Ambulatory Visit (INDEPENDENT_AMBULATORY_CARE_PROVIDER_SITE_OTHER): Payer: Medicare Other | Admitting: Cardiology

## 2016-05-06 ENCOUNTER — Encounter: Payer: Self-pay | Admitting: Cardiology

## 2016-05-06 VITALS — BP 110/64 | HR 70 | Ht 67.0 in | Wt 166.4 lb

## 2016-05-06 DIAGNOSIS — I5022 Chronic systolic (congestive) heart failure: Secondary | ICD-10-CM

## 2016-05-06 DIAGNOSIS — I2510 Atherosclerotic heart disease of native coronary artery without angina pectoris: Secondary | ICD-10-CM | POA: Diagnosis not present

## 2016-05-06 DIAGNOSIS — I10 Essential (primary) hypertension: Secondary | ICD-10-CM

## 2016-05-06 DIAGNOSIS — I495 Sick sinus syndrome: Secondary | ICD-10-CM

## 2016-05-06 DIAGNOSIS — I482 Chronic atrial fibrillation: Secondary | ICD-10-CM

## 2016-05-06 DIAGNOSIS — I4821 Permanent atrial fibrillation: Secondary | ICD-10-CM

## 2016-05-06 DIAGNOSIS — E785 Hyperlipidemia, unspecified: Secondary | ICD-10-CM

## 2016-05-06 DIAGNOSIS — I340 Nonrheumatic mitral (valve) insufficiency: Secondary | ICD-10-CM

## 2016-05-06 LAB — MISC LABCORP TEST (SEND OUT): LABCORP TEST CODE: 19588

## 2016-05-06 LAB — CYTOLOGY - NON PAP

## 2016-05-06 NOTE — Patient Instructions (Addendum)
Medication Instructions:  Your physician recommends that you continue on your current medications as directed. Please refer to the Current Medication list given to you today.   Labwork: None ordered   Testing/Procedures: None ordered  Follow-Up: Your physician wants you to follow-up in: 6 months with Dr. Yvone Neu. You will receive a reminder letter in the mail two months in advance. If you don't receive a letter, please call our office to schedule the follow-up appointment.   Any Other Special Instructions Will Be Listed Below (If Applicable).   (Called patients pharmacy and patient is taking eliquis 2.5 mg twice daily, discontinued 5 mg dose because this was an error in entry)  If you need a refill on your cardiac medications before your next appointment, please call your pharmacy.

## 2016-05-06 NOTE — Progress Notes (Signed)
Cardiology Office Note   Date:  05/06/2016   ID:  Donald Henry, DOB 1930/06/12, MRN [id number]  Referring Doctor:  Madelyn Brunner, MD   Cardiologist:   Wende Bushy, MD   Reason for consultation:  Chief Complaint  Patient presents with  . Follow-up    no cp, sob or swelling. no other complaints.      History of Present Illness: Donald Henry is a 82 y.o. male who presents for Follow-up for CHF, CAD, A. fib.   In terms of his CHF, Patient has been stable. He has since been scheduled for weekly taps to drain pleural effusion. He has chronic SOB with minimal exertion, NYHA Class III.   In terms of coronary artery disease, patient reports having a heart attack in his 19s. That was medically managed. He did not have any stenting or bypass done. No CP.  In terms of atrial fibrillation, he is aware of the diagnosis but he does not recall having any issues with the irregular heartbeat. He denies having any symptoms of palpitations or feeling of the change in rhythm.   In terms of the pacemaker, he recalls that was placed 2 years ago.   ROS:  Please see the history of present illness. Aside from mentioned under HPI, all other systems are reviewed and negative.     Past Medical History  Diagnosis Date  . Diabetes mellitus without complication ([place])   . SSS (sick sinus syndrome) ([facility])     a. s/p MDT ADSRO1 Single Lead PPM.  . Myocardial infarction [facility])     a. MI @ age 74-->conservatively managed.  Has never had a cath.  Reports multiple negative stress tests.  . Ischemic cardiomyopathy   . Chronic combined systolic (congestive) and diastolic (congestive) heart failure ([place])     a. 01/2015 Echo: EF 40%, mod MR/PR/TR;  b. 10/2015 Echo: EF 20-25%, sev MR, sev dil LA/RA, sev TR.  Marland Kitchen Hypertensive heart disease   . Pulmonary embolism ([place])     a. 10/2015 ->Eliquis.  . Normocytic anemia   . Peptic ulcer disease   . Obesity   . CKD (chronic kidney disease), stage III    . BPH (benign prostatic hyperplasia)   . Recurrent right pleural effusion     a. 01/28/2016 1.7L fluid removal via thoracentesis; b. 02/19/2016 2.4L removal via thoracentesis.  . Severe mitral regurgitation     a. 10/2015 Echo: sev MR.  . Hiatal hernia     Past Surgical History  Procedure Laterality Date  . Insert / replace / remove pacemaker      [employer]  Serial # [id number]     reports that he has never smoked. He does not have any smokeless tobacco history on file. He reports that he does not drink alcohol or use illicit drugs.   family history includes Diabetes Mellitus II in his brother and sister; Heart attack in his son and son; Heart disease in his father and mother; Leukemia in his sister.   Current Outpatient Prescriptions  Medication Sig Dispense Refill  . apixaban (ELIQUIS) 5 MG TABS tablet Take 5 mg by mouth 2 (two) times daily.    Marland Kitchen atorvastatin (LIPITOR) 40 MG tablet Take 1 tablet (40 mg total) by mouth daily. 30 tablet 0  . carvedilol (COREG) 6.25 MG tablet Take 1 tablet (6.25 mg total) by mouth 2 (two) times daily. 30 tablet 6  . chlorthalidone (HYGROTON) 25 MG tablet Take 25 mg by  mouth daily.    . furosemide (LASIX) 20 MG tablet Take 20 mg by mouth daily. Reported on 04/28/2016    . glipiZIDE (GLUCOTROL) 10 MG tablet Take 10 mg by mouth daily before breakfast.    . isosorbide mononitrate (IMDUR) 30 MG 24 hr tablet Take 30 mg by mouth daily.    Marland Kitchen LORazepam (ATIVAN) 0.5 MG tablet Take 0.5 mg by mouth every 8 (eight) hours as needed for anxiety.    Marland Kitchen terazosin (HYTRIN) 2 MG capsule Take 2 mg by mouth at bedtime.    . torsemide (DEMADEX) 20 MG tablet Take 1 tablet (20 mg total) by mouth daily. 30 tablet 6  . traMADol (ULTRAM) 50 MG tablet Take 50 mg by mouth every 6 (six) hours as needed. Reported on 04/28/2016    . vitamin B-12 (CYANOCOBALAMIN) 1000 MCG tablet Take 1,000 mcg by mouth daily.    Marland Kitchen zolpidem (AMBIEN) 5 MG tablet Take 5 mg by mouth at bedtime as  needed for sleep.     No current facility-administered medications for this visit.    Allergies: Aspirin    PHYSICAL EXAM: VS:  Ht 5\' 7"  (1.702 m)  Wt 166 lb 6.4 oz (75.479 kg)  BMI 26.06 kg/m2 , Body mass index is 26.06 kg/(m^2). Wt Readings from Last 3 Encounters:  05/06/16 166 lb 6.4 oz (75.479 kg)  04/28/16 167 lb (75.751 kg)  04/22/16 167 lb (75.751 kg)    GENERAL:  well developed, well nourished, not in acute distress HEENT: normocephalic, pink conjunctivae, anicteric sclerae, no xanthelasma, normal dentition, oropharynx clear NECK:No neck vein engorgement, no hepatojugular reflux, carotid upstroke brisk and symmetric, no bruit, no thyromegaly, no lymphadenopathy LUNGS:  good respiratory effort, clear to auscultation bilaterally CV:  PMI laterally displaced, no thrills, no lifts, S2 within normal limits, no palpable S3 or S4, MR murmur soft, no rubs, no gallops ABD:  Soft, nontender, nondistended, normoactive bowel sounds, no abdominal aortic bruit, possible hepatomegaly MS: nontender back, no kyphosis, no scoliosis, no joint deformities EXT:  2+ DP/PT pulses, improved edema, no varicosities, no cyanosis, no clubbing SKIN: warm, nondiaphoretic, normal turgor, no ulcers NEUROPSYCH: alert, oriented to person, place, and time, sensory/motor grossly intact, normal mood, appropriate affect  Recent Labs: 11/18/2015: ALT 21 02/20/2016: TSH 1.195 04/02/2016: B Natriuretic Peptide 1110.0* 04/22/2016: BUN 73*; Creatinine, Ser 2.20*; Hemoglobin 9.5*; Platelets 176; Potassium 4.3; Sodium 135   Lipid Panel No results found for: CHOL, TRIG, HDL, CHOLHDL, VLDL, LDLCALC, LDLDIRECT   Other studies Reviewed:  EKG:   EKG from 04/06/2016 was personally reviewed by me and showed the paced at 73 BPM. Underlying rhythm is likely atrial fibrillation. EKG from 03/22/2016 showed adequate chronic ventricular pacemaker, underlying rhythm likely atrophic ablation. The ekg ordered 03/03/2016 was  personally reviewed by me and it reveals electronic ventricular pacemaker, ventricular rate 69 BPM. Underlying rhythm is atrial fibrillation.  Additional studies/ records that were reviewed personally reviewed by me today include:  Echo: 11/19/2015, and LV cavity size severely dilated. EF 20-25%. Severe MR. LA severely dilated. RV moderately dilated RA severely dilated. Tricuspid valve severe regurgitation.  Echo 03/16/2016: Left ventricle: The cavity size was normal. There was moderate  concentric hypertrophy. Systolic function was moderately to  severely reduced. The estimated ejection fraction was in the  range of 30% to 35%. - Mitral valve: There was moderate regurgitation. - Left atrium: The atrium was moderately dilated. - Right atrium: The atrium was mildly dilated. - Tricuspid valve: There was moderate regurgitation. - Pulmonary  arteries: Systolic pressure was moderately increased.  PA peak pressure: 50 mm Hg (S).   Nuclear stress test 03/29/2016:  Defect 1: There is a large defect of severe severity present in the mid anterior, mid anterolateral, apical anterior and apex location.  Findings consistent with prior myocardial infarction with moderate peri-infarct ischemia.  This is a high risk study.  Nuclear stress EF: 33%.  ASSESSMENT AND PLAN:  Congestive heart failure, systolic dysfunction,  chronic History of coronary artery disease status post MI Cardiomyopathy- possibly ischemic cardiomyopathy, (more likely versus tachycardia induced/atrial fibrillation)  cont torsemide 20 mg by mouth daily. Nephrology discontinued chlorthalidone.  Patient declined pleural catheter, chose to do weekly taps instead.  Daily weights. Sodium restriction. Continue carvedilol 6.25 twice a day. Unsure if he will tolerate entresto due to borderline BP/ CKD. Continue statin therapy. LDL goal is less than 70. Ejection fraction is slightly improved on echocardiogram 03/16/2016, although it  remains depressed.   In terms of the abnormal stress test, it is consistent with prior MI with moderate peri-infarct ischemia. He has no chest pain. He has ongoing shortness of breath likely due to congestive heart failure, this is chronic and stable. Discussed the results of this test in detail with the patient. Ideally, a left heart catheterization can be done due to ongoing symptoms. However, he has chronic kidney disease with a creatinine that is elevated, increasing risk of LHCath. Recommend continued follow up with nephrology as well.  Likely permanent atrial fibrillation It appears from history, patient is not aware of the arrhythmia at all. Biatrial enlargement is severe. Question the utility of cardioversion.  Patient will be seeing EP service anyway. He has been on beta blocker and eliquis. Dosed at 2.5 mg twice a day due to age more than 59 and creatinine more than 1.5. Instructed patient to take close to 12 hours apart.  SSS: s/p MDT PPM. Last interrogated in December  2016. Per note in care everywhere "normal pacemaker function. Battery at ERI/RRT. F/U 6 months."We will arrange for f/u with Dr. Caryl Comes soon, sometime in early April 2017. The visit next week with EP is to establish care with patient's history of sick sinus syndrome status post pacemaker placement. I discussed with the patient possibility of evaluation eventually for upgrade to ICD for primary prevention of SCD. I have a feeling that he may not qualify due to advanced age. Patient will be seen Dr. Caryl Comes soon.  Mitral Regurgitation/TR Repeat echo shows moderate MR, moderate TR. Likely related to dilated cardiopathy.   Hypertension  BP is well controlled. Continue monitoring BP. Continue current medical therapy and lifestyle changes.   Hyperlipidemia.  Cont statin. LDL 86 in 03/2015. PCP following labs. LDL goal is less than 70 for recommendation.   Current medicines are reviewed at length with the patient today.  The  patient does not have concerns regarding medicines.  Labs/ tests ordered today include:  No orders of the defined types were placed in this encounter.    I had a lengthy and detailed discussion with the patient regarding diagnoses, prognosis, diagnostic options, treatment options, and side effects of medications.       Disposition:   FU with undersigned in 6 months  Signed, Wende Bushy, MD  05/06/2016 11:11 AM    [place]

## 2016-05-06 NOTE — Addendum Note (Signed)
Addended by: Valora Corporal on: 05/06/2016 04:00 PM   Modules accepted: Orders, Medications

## 2016-05-09 LAB — CULTURE, BODY FLUID W GRAM STAIN -BOTTLE: Culture: NO GROWTH

## 2016-05-09 LAB — CULTURE, BODY FLUID-BOTTLE

## 2016-05-12 ENCOUNTER — Ambulatory Visit
Admission: RE | Admit: 2016-05-12 | Discharge: 2016-05-12 | Disposition: A | Discharge status: Home / Self Care | Payer: Medicare Other | Source: Ambulatory Visit | Attending: Specialist | Admitting: Specialist

## 2016-05-12 DIAGNOSIS — J90 Pleural effusion, not elsewhere classified: Secondary | ICD-10-CM | POA: Diagnosis present

## 2016-05-12 DIAGNOSIS — R846 Abnormal cytological findings in specimens from respiratory organs and thorax: Secondary | ICD-10-CM | POA: Diagnosis not present

## 2016-05-12 DIAGNOSIS — Z9889 Other specified postprocedural states: Secondary | ICD-10-CM | POA: Diagnosis not present

## 2016-05-13 LAB — CYTOLOGY - NON PAP

## 2016-05-17 ENCOUNTER — Other Ambulatory Visit: Payer: Self-pay | Admitting: Specialist

## 2016-05-17 DIAGNOSIS — J90 Pleural effusion, not elsewhere classified: Secondary | ICD-10-CM

## 2016-05-19 ENCOUNTER — Ambulatory Visit
Admission: RE | Admit: 2016-05-19 | Discharge: 2016-05-19 | Disposition: A | Discharge status: Home / Self Care | Payer: Medicare Other | Source: Ambulatory Visit | Attending: Specialist | Admitting: Specialist

## 2016-05-19 DIAGNOSIS — Z9889 Other specified postprocedural states: Secondary | ICD-10-CM | POA: Diagnosis not present

## 2016-05-19 DIAGNOSIS — J90 Pleural effusion, not elsewhere classified: Secondary | ICD-10-CM

## 2016-05-19 NOTE — Procedures (Signed)
Interventional Radiology Procedure Note  Procedure: US guided right thorac. 2.3 L thin yellow fluid removed by vacu-tainer. Complications: None Recommendations:  - CXR now - Ok to shower tomorrow - Do not submerge for 7 days - Routine care   Signed,  Dulcy Fanny. Earleen Newport, DO

## 2016-05-25 ENCOUNTER — Other Ambulatory Visit: Payer: Self-pay | Admitting: Physician Assistant

## 2016-05-26 ENCOUNTER — Ambulatory Visit
Admission: RE | Admit: 2016-05-26 | Discharge: 2016-05-26 | Disposition: A | Discharge status: Home / Self Care | Payer: Medicare Other | Source: Ambulatory Visit | Attending: Specialist | Admitting: Specialist

## 2016-05-26 DIAGNOSIS — Z9889 Other specified postprocedural states: Secondary | ICD-10-CM | POA: Diagnosis not present

## 2016-05-26 DIAGNOSIS — J90 Pleural effusion, not elsewhere classified: Secondary | ICD-10-CM | POA: Diagnosis not present

## 2016-05-26 LAB — CULTURE, FUNGUS WITHOUT SMEAR

## 2016-06-02 ENCOUNTER — Ambulatory Visit
Admission: RE | Admit: 2016-06-02 | Discharge: 2016-06-02 | Disposition: A | Discharge status: Home / Self Care | Payer: Medicare Other | Source: Ambulatory Visit | Attending: Specialist | Admitting: Specialist

## 2016-06-02 DIAGNOSIS — J90 Pleural effusion, not elsewhere classified: Secondary | ICD-10-CM

## 2016-06-02 LAB — BODY FLUID CELL COUNT WITH DIFFERENTIAL
EOS FL: 0 %
Lymphs, Fluid: 81 %
MONOCYTE-MACROPHAGE-SEROUS FLUID: 8 %
Neutrophil Count, Fluid: 11 %
Other Cells, Fluid: 0 %
Total Nucleated Cell Count, Fluid: 119 cu mm

## 2016-06-02 LAB — GLUCOSE, SEROUS FLUID: Glucose, Fluid: 160 mg/dL

## 2016-06-02 LAB — PROTEIN, BODY FLUID

## 2016-06-02 LAB — LACTATE DEHYDROGENASE: LDH: 103 U/L (ref 98–192)

## 2016-06-02 NOTE — Procedures (Signed)
Interventional Radiology Procedure Note  Procedure: US guided right thoracentesis.  Complications: None Recommendations:  - Ok to shower tomorrow - Do not submerge for 7 days - Routine care - CXR now   Signed,  Dulcy Fanny. Earleen Newport, DO

## 2016-06-03 ENCOUNTER — Other Ambulatory Visit: Payer: Self-pay | Admitting: Specialist

## 2016-06-03 DIAGNOSIS — J90 Pleural effusion, not elsewhere classified: Secondary | ICD-10-CM

## 2016-06-03 LAB — MISC LABCORP TEST (SEND OUT): LABCORP TEST CODE: 19588

## 2016-06-04 LAB — CYTOLOGY - NON PAP

## 2016-06-06 LAB — BODY FLUID CULTURE: Culture: NO GROWTH

## 2016-06-08 ENCOUNTER — Encounter: Payer: Medicare Other | Admitting: Internal Medicine

## 2016-06-08 ENCOUNTER — Other Ambulatory Visit: Payer: Self-pay | Admitting: Physician Assistant

## 2016-06-08 NOTE — Progress Notes (Signed)
ELECTROPHYSIOLOGY CONSULT NOTE  Patient ID: Donald Henry, MRN: [id number], DOB/AGE: Jan 22, 1930 82 y.o. y.o. Admit date: (Not on file) Date of Consult: 06/08/2016  Primary Physician: Madelyn Brunner, MD Primary Cardiologist:  Consulting Physician   Chief Complaint:    HPI Donald Henry is a 82 y.o. male seen to establish pacemaker follow-up. History of permanent atrial fibrillation. He is managed with apixoban  Thromboembolic risk factors are normal for age-37 diabetes-1 coronary disease-1 congestive heart failure-1 retention-1 for a CHADS-VASc score of greater than or equal to 6  Echocardiogram 3/17 demonstrated EF of 30-35% with moderate LAE (46/2.4/45) moderate MR and TR and moderate pulmonary hypertension  There is a history of bilateral pulmonary embolism. She's also had problems with pleural effusions and underwent thoracentesis 2/17   Past Medical History  Diagnosis Date  . Diabetes mellitus without complication ([place])   . SSS (sick sinus syndrome) ([facility])     a. s/p MDT ADSRO1 Single Lead PPM.  . Myocardial infarction [facility])     a. MI @ age 8-->conservatively managed.  Has never had a cath.  Reports multiple negative stress tests.  . Ischemic cardiomyopathy   . Chronic combined systolic (congestive) and diastolic (congestive) heart failure ([place])     a. 01/2015 Echo: EF 40%, mod MR/PR/TR;  b. 10/2015 Echo: EF 20-25%, sev MR, sev dil LA/RA, sev TR.  Marland Kitchen Hypertensive heart disease   . Pulmonary embolism ([place])     a. 10/2015 ->Eliquis.  . Normocytic anemia   . Peptic ulcer disease   . Obesity   . CKD (chronic kidney disease), stage III   . BPH (benign prostatic hyperplasia)   . Recurrent right pleural effusion     a. 01/28/2016 1.7L fluid removal via thoracentesis; b. 02/19/2016 2.4L removal via thoracentesis.  . Severe mitral regurgitation     a. 10/2015 Echo: sev MR.  . Hiatal hernia       Surgical History:  Past Surgical History  Procedure Laterality Date    . Insert / replace / remove pacemaker      Medtronic  Serial # [id number]     Home Meds: Prior to Admission medications   Medication Sig Start Date End Date Taking? Authorizing Provider  apixaban (ELIQUIS) 2.5 MG TABS tablet Take 2.5 mg by mouth 2 (two) times daily.    Historical Provider, MD  atorvastatin (LIPITOR) 40 MG tablet Take 1 tablet (40 mg total) by mouth daily. 02/20/16   Max Sane, MD  carvedilol (COREG) 6.25 MG tablet Take 1 tablet (6.25 mg total) by mouth 2 (two) times daily. 03/22/16   Wende Bushy, MD  chlorthalidone (HYGROTON) 25 MG tablet Take 25 mg by mouth daily.    Historical Provider, MD  furosemide (LASIX) 20 MG tablet Take 20 mg by mouth daily. Reported on 04/28/2016    Historical Provider, MD  glipiZIDE (GLUCOTROL) 10 MG tablet Take 10 mg by mouth daily before breakfast.    Historical Provider, MD  isosorbide mononitrate (IMDUR) 30 MG 24 hr tablet Take 30 mg by mouth daily.    Historical Provider, MD  LORazepam (ATIVAN) 0.5 MG tablet Take 0.5 mg by mouth every 8 (eight) hours as needed for anxiety.    Historical Provider, MD  terazosin (HYTRIN) 2 MG capsule Take 2 mg by mouth at bedtime.    Historical Provider, MD  torsemide (DEMADEX) 20 MG tablet Take 1 tablet (20 mg total) by mouth daily. 04/20/16   Wende Bushy, MD  traMADol (ULTRAM) 50 MG tablet Take 50 mg by mouth every 6 (six) hours as needed. Reported on 04/28/2016    Historical Provider, MD  vitamin B-12 (CYANOCOBALAMIN) 1000 MCG tablet Take 1,000 mcg by mouth daily.    Historical Provider, MD  zolpidem (AMBIEN) 5 MG tablet Take 5 mg by mouth at bedtime as needed for sleep.    Historical Provider, MD    Allergies:  Allergies  Allergen Reactions  . Aspirin     Other reaction(s): Other (See Comments) Hx of bleeding ulcer at 82yrs old    Social History   Social History  . Marital Status: Married    Spouse Name: N/A  . Number of Children: N/A  . Years of Education: N/A   Occupational  History  . Not on file.   Social History Main Topics  . Smoking status: Never Smoker   . Smokeless tobacco: Not on file  . Alcohol Use: No  . Drug Use: No  . Sexual Activity: Not on file   Other Topics Concern  . Not on file   Social History Narrative     Family History  Problem Relation Age of Onset  . Heart disease Mother   . Heart disease Father     a. First MI @ 27.  . Leukemia Sister   . Diabetes Mellitus II Sister   . Diabetes Mellitus II Brother   . Heart attack Son     a. MI @ 40  . Heart attack Son     a. MI @ 58.     ROS:  Please see the history of present illness.     All other systems reviewed and negative.    Physical Exam: There were no vitals taken for this visit. General: Well developed, well nourished male in no acute distress. Head: Normocephalic, atraumatic, sclera non-icteric, no xanthomas, nares are without discharge. EENT: normal  Lymph Nodes:  none Neck: Negative for carotid bruits. JVD not elevated. Back:without scoliosis kyphosis Lungs: Clear bilaterally to auscultation without wheezes, rales, or rhonchi. Breathing is unlabored. Heart: RRR with S1 S2. No  /6 systolic murmur . No rubs, or gallops appreciated. Abdomen: Soft, non-tender, non-distended with normoactive bowel sounds. No hepatomegaly. No rebound/guarding. No obvious abdominal masses. Msk:  Strength and tone appear normal for age. Extremities: No clubbing or cyanosis. No + edema.  Distal pedal pulses are 2+ and equal bilaterally. Skin: Warm and Dry Neuro: Alert and oriented X 3. CN III-XII intact Grossly normal sensory and motor function . Psych:  Responds to questions appropriately with a normal affect.      Labs: Cardiac Enzymes No results for input(s): CKTOTAL, CKMB, TROPONINI in the last 72 hours. CBC Lab Results  Component Value Date   WBC 7.3 04/22/2016   HGB 9.5* 04/22/2016   HCT 28.3* 04/22/2016   MCV 93.1 04/22/2016   PLT 176 04/22/2016   PROTIME: No results  for input(s): LABPROT, INR in the last 72 hours. Chemistry No results for input(s): NA, K, CL, CO2, BUN, CREATININE, CALCIUM, PROT, BILITOT, ALKPHOS, ALT, AST, GLUCOSE in the last 168 hours.  Invalid input(s): LABALBU Lipids No results found for: CHOL, HDL, LDLCALC, TRIG BNP No results found for: PROBNP Thyroid Function Tests: No results for input(s): TSH, T4TOTAL, T3FREE, THYROIDAB in the last 72 hours.  Invalid input(s): FREET3 Miscellaneous No results found for: DDIMER  Radiology/Studies:  Dg Chest 2 View  06/02/2016  CLINICAL DATA:  Status post large volume right-sided thoracentesis. EXAM: CHEST  2  VIEW COMPARISON:  05/26/2016 FINDINGS: Minimal right pleural fluid present following the procedure. No pneumothorax. There is no evidence of pulmonary edema, consolidation or nodule. The heart size is stable. Stable appearance of single chamber pacemaker. IMPRESSION: Minimal right pleural fluid following thoracentesis. No pneumothorax. Electronically Signed   By: Aletta Edouard M.D.   On: 06/02/2016 17:40   Dg Chest 2 View  05/26/2016  CLINICAL DATA:  Post thoracentesis EXAM: CHEST  2 VIEW COMPARISON:  05/19/2016 FINDINGS: No pneumothorax. Small bilateral pleural effusions. Normal heart size. Left subclavian pacemaker device is stable. Minimal bibasilar subsegmental atelectasis. IMPRESSION: No pneumothorax. Electronically Signed   By: Marybelle Killings M.D.   On: 05/26/2016 15:18   Dg Chest 2 View  05/19/2016  CLINICAL DATA:  82 year old male with recurrent right-sided pleural effusion, status post right thoracentesis EXAM: CHEST  2 VIEW COMPARISON:  05/12/2016 FINDINGS: Cardiomediastinal silhouette unchanged. Cardiac pacing device on left chest wall unchanged. Minimal opacity at the right base. Thickening of the minor fissure. Blunting of the costophrenic sulcus on the lateral view. No pneumothorax. Left lung relatively well aerated. No acute bony abnormality. IMPRESSION: Status post right-sided  thoracentesis without complicating feature. Trace residual right-sided pleural effusion with likely atelectasis and/or scarring. Signed, Dulcy Fanny. Earleen Newport, DO [facility] Radiology Electronically Signed   By: Corrie Mckusick D.O.   On: 05/19/2016 15:00   Dg Chest 2 View  05/12/2016  CLINICAL DATA:  Post thoracentesis EXAM: CHEST  2 VIEW COMPARISON:  05/05/2016 FINDINGS: There is no pneumothorax after thoracentesis. Minimal right pleural effusion persists. Tiny left pleural effusion. Normal heart size. Left subclavian pacemaker device is stable. Upper lungs are clear. Bibasilar atelectasis. IMPRESSION: No evidence of pneumothorax after right thoracentesis. Electronically Signed   By: Marybelle Killings M.D.   On: 05/12/2016 15:29   US Thoracentesis Asp Pleural Space W/img Guide  06/02/2016  INDICATION: 82 year old male with a history of recurrent right-sided pleural effusion. EXAM: ULTRASOUND GUIDED RIGHT THORACENTESIS MEDICATIONS: None. COMPLICATIONS: None PROCEDURE: An ultrasound guided thoracentesis was thoroughly discussed with the patient and questions answered. The benefits, risks, alternatives and complications were also discussed. The patient understands and wishes to proceed with the procedure. Written consent was obtained. Ultrasound was performed to localize and mark an adequate pocket of fluid in the right chest. The area was then prepped and draped in the normal sterile fashion. 1% Lidocaine was used for local anesthesia. Under ultrasound guidance a Safe-T-Centesis catheter was introduced. Thoracentesis was performed. The catheter was removed and a dressing applied. FINDINGS: A total of approximately 2.2 L of thin yellow fluid was removed. Samples were sent to the laboratory as requested by the clinical team. IMPRESSION: Status post right-sided thoracentesis with 2.2 L of thin yellow fluid removed. Signed, Dulcy Fanny. Earleen Newport, DO [facility] Radiology Electronically Signed   By: Corrie Mckusick D.O.   On: 06/02/2016 16:04   US Thoracentesis Asp Pleural Space W/img Guide  05/26/2016  INDICATION: Right pleural effusion EXAM: ULTRASOUND GUIDED RIGHT THORACENTESIS MEDICATIONS: None. COMPLICATIONS: None immediate. PROCEDURE: An ultrasound guided thoracentesis was thoroughly discussed with the patient and questions answered. The benefits, risks, alternatives and complications were also discussed. The patient understands and wishes to proceed with the procedure. Written consent was obtained. Ultrasound was performed to localize and mark an adequate pocket of fluid in the right chest. The area was then prepped and draped in the normal sterile fashion. 1% Lidocaine was used for local anesthesia. Under ultrasound  guidance a Safe-T-Centesis catheter was introduced. Thoracentesis was performed. The catheter was removed and a dressing applied. FINDINGS: A total of approximately 2 L of clear yellow fluid was removed. IMPRESSION: Successful ultrasound guided right thoracentesis yielding 2 L of pleural fluid. Electronically Signed   By: Marybelle Killings M.D.   On: 05/26/2016 15:08   US Thoracentesis Asp Pleural Space W/img Guide  05/19/2016  INDICATION: 82 year old male with a history of recurrent right-sided pleural effusion. EXAM: ULTRASOUND GUIDED RIGHT THORACENTESIS MEDICATIONS: None. COMPLICATIONS: None PROCEDURE: An ultrasound guided thoracentesis was thoroughly discussed with the patient and questions answered. The benefits, risks, alternatives and complications were also discussed. The patient understands and wishes to proceed with the procedure. Written consent was obtained. Ultrasound was performed to localize and mark an adequate pocket of fluid in the right chest. The area was then prepped and draped in the normal sterile fashion. 1% Lidocaine was used for local anesthesia. Under ultrasound guidance a Safe-T-Centesis  catheter was introduced. Thoracentesis was performed. The catheter was removed and a dressing applied. FINDINGS: A total of approximately 2.3 L of thin yellow fluid was removed. IMPRESSION: Status post ultrasound-guided right-sided thoracentesis yielding 2.3 L thin yellow fluid. Signed, Dulcy Fanny. Earleen Newport, DO [facility] Radiology Electronically Signed   By: Corrie Mckusick D.O.   On: 05/19/2016 14:58   US Thoracentesis Asp Pleural Space W/img Guide  05/12/2016  INDICATION: Right pleural effusion EXAM: ULTRASOUND GUIDED RIGHT THORACENTESIS MEDICATIONS: None. COMPLICATIONS: None immediate. PROCEDURE: An ultrasound guided thoracentesis was thoroughly discussed with the patient and questions answered. The benefits, risks, alternatives and complications were also discussed. The patient understands and wishes to proceed with the procedure. Written consent was obtained. Ultrasound was performed to localize and mark an adequate pocket of fluid in the right chest. The area was then prepped and draped in the normal sterile fashion. 1% Lidocaine was used for local anesthesia. Under ultrasound guidance a Safe-T-Centesis catheter was introduced. Thoracentesis was performed. The catheter was removed and a dressing applied. FINDINGS: A total of approximately 2.2 L of clear yellow fluid was removed. IMPRESSION: Successful ultrasound guided right thoracentesis yielding 2.2 L of pleural fluid. Electronically Signed   By: Marybelle Killings M.D.   On: 05/12/2016 15:56    EKG:    Assessment and Plan:  Virl Axe

## 2016-06-09 ENCOUNTER — Ambulatory Visit
Admission: RE | Admit: 2016-06-09 | Discharge: 2016-06-09 | Disposition: A | Discharge status: Home / Self Care | Payer: Medicare Other | Source: Ambulatory Visit | Attending: Specialist | Admitting: Specialist

## 2016-06-09 DIAGNOSIS — J90 Pleural effusion, not elsewhere classified: Secondary | ICD-10-CM

## 2016-06-09 DIAGNOSIS — Z9889 Other specified postprocedural states: Secondary | ICD-10-CM | POA: Insufficient documentation

## 2016-06-09 NOTE — Procedures (Signed)
US thoracentesis  Complications:  None  Blood Loss: none  See dictation in canopy pacs

## 2016-06-15 ENCOUNTER — Telehealth: Payer: Self-pay | Admitting: Radiology

## 2016-06-15 NOTE — Telephone Encounter (Signed)
Pt states that he is still undecided regarding placement of pleural catheter.  He has our contact information & states that he will call should he decide on this Rx.  Pradeep Beaubrun Riki Rusk, [place] 06/15/2016 2:39 PM

## 2016-06-15 NOTE — Discharge Instructions (Signed)
Thoracentesis, Care After °Refer to this sheet in the next few weeks. These instructions provide you with information about caring for yourself after your procedure. Your health care provider may also give you more specific instructions. Your treatment has been planned according to current medical practices, but problems sometimes occur. Call your health care provider if you have any problems or questions after your procedure. °WHAT TO EXPECT AFTER THE PROCEDURE °After your procedure, it is common to have pain at the puncture site. °HOME CARE INSTRUCTIONS °· Take medicines only as directed by your health care provider. °· You may return to your normal diet and normal activities as directed by your health care provider. °· Drink enough fluid to keep your urine clear or pale yellow. °· Do not take baths, swim, or use a hot tub until your health care provider approves. °· Follow your health care provider's instructions about: °¨ Puncture site care. °¨ Bandage (dressing) changes and removal. °· Check your puncture site every day for signs of infection. Watch for: °¨ Redness, swelling, or pain. °¨ Fluid, blood, or pus. °· Keep all follow-up visits as directed by your health care provider. This is important. °SEEK MEDICAL CARE IF: °· You have redness, swelling, or pain at your puncture site. °· You have fluid, blood, or pus coming from your puncture site. °· You have a fever. °· You have chills. °· You have nausea or vomiting. °· You have trouble breathing. °· You develop a worsening cough. °SEEK IMMEDIATE MEDICAL CARE IF: °· You have extreme shortness of breath. °· You develop chest pain. °· You faint or feel light-headed. °  °This information is not intended to replace advice given to you by your health care provider. Make sure you discuss any questions you have with your health care provider. °  °Document Released: 01/03/2015 Document Reviewed: 01/03/2015 °Elsevier Interactive Patient Education ©2016 [employer] ° °

## 2016-06-16 ENCOUNTER — Ambulatory Visit
Admission: RE | Admit: 2016-06-16 | Discharge: 2016-06-16 | Disposition: A | Discharge status: Home / Self Care | Payer: Medicare Other | Source: Ambulatory Visit | Attending: Specialist | Admitting: Specialist

## 2016-06-16 DIAGNOSIS — J90 Pleural effusion, not elsewhere classified: Secondary | ICD-10-CM | POA: Diagnosis not present

## 2016-06-16 DIAGNOSIS — Z9889 Other specified postprocedural states: Secondary | ICD-10-CM | POA: Insufficient documentation

## 2016-06-16 NOTE — Procedures (Signed)
Under US guidance, therapeutic thoracentesis of right pleural effusion was performed. 2400 mls of fluid removed. CXR to follow.

## 2016-06-22 ENCOUNTER — Encounter: Payer: Self-pay | Admitting: Cardiology

## 2016-06-22 NOTE — Progress Notes (Signed)
In-home Palliative Medicine Consult completed by Dorian Pod, NP on 06/17/16. Consult notes placed on Dr. Tora Kindred desk for her review.

## 2016-06-23 ENCOUNTER — Ambulatory Visit
Admission: RE | Admit: 2016-06-23 | Discharge: 2016-06-23 | Disposition: A | Discharge status: Home / Self Care | Payer: Medicare Other | Source: Ambulatory Visit | Attending: Specialist | Admitting: Specialist

## 2016-06-23 DIAGNOSIS — J90 Pleural effusion, not elsewhere classified: Secondary | ICD-10-CM

## 2016-06-23 DIAGNOSIS — Z9889 Other specified postprocedural states: Secondary | ICD-10-CM | POA: Diagnosis not present

## 2016-06-23 LAB — CULTURE, FUNGUS WITHOUT SMEAR

## 2016-06-23 LAB — ACID FAST SMEAR (AFB)

## 2016-06-23 LAB — ACID FAST CULTURE WITH REFLEXED SENSITIVITIES

## 2016-06-23 LAB — ACID FAST SMEAR (AFB, MYCOBACTERIA): Acid Fast Smear: NEGATIVE

## 2016-06-23 LAB — ACID FAST CULTURE WITH REFLEXED SENSITIVITIES (MYCOBACTERIA): Acid Fast Culture: NEGATIVE

## 2016-06-23 NOTE — OR Nursing (Signed)
Prior to education Pt verbalized that he was pretty sure he did not want the pluerX tube placed but he will make final decision later. Pt and son given pluerX tube booklet, and they watched DVD while here. Allowed to ask questions. Pt told that [place] will call Thursday and Friday to set up procedural date if the patient want to have done.

## 2016-06-23 NOTE — Procedures (Signed)
Under US guidance, right thoracentesis was performed. CXR will be performed.

## 2016-06-28 NOTE — Discharge Instructions (Signed)
Thoracentesis, Care After °Refer to this sheet in the next few weeks. These instructions provide you with information about caring for yourself after your procedure. Your health care provider may also give you more specific instructions. Your treatment has been planned according to current medical practices, but problems sometimes occur. Call your health care provider if you have any problems or questions after your procedure. °WHAT TO EXPECT AFTER THE PROCEDURE °After your procedure, it is common to have pain at the puncture site. °HOME CARE INSTRUCTIONS °· Take medicines only as directed by your health care provider. °· You may return to your normal diet and normal activities as directed by your health care provider. °· Drink enough fluid to keep your urine clear or pale yellow. °· Do not take baths, swim, or use a hot tub until your health care provider approves. °· Follow your health care provider's instructions about: °¨ Puncture site care. °¨ Bandage (dressing) changes and removal. °· Check your puncture site every day for signs of infection. Watch for: °¨ Redness, swelling, or pain. °¨ Fluid, blood, or pus. °· Keep all follow-up visits as directed by your health care provider. This is important. °SEEK MEDICAL CARE IF: °· You have redness, swelling, or pain at your puncture site. °· You have fluid, blood, or pus coming from your puncture site. °· You have a fever. °· You have chills. °· You have nausea or vomiting. °· You have trouble breathing. °· You develop a worsening cough. °SEEK IMMEDIATE MEDICAL CARE IF: °· You have extreme shortness of breath. °· You develop chest pain. °· You faint or feel light-headed. °  °This information is not intended to replace advice given to you by your health care provider. Make sure you discuss any questions you have with your health care provider. °  °Document Released: 01/03/2015 Document Reviewed: 01/03/2015 °Elsevier Interactive Patient Education ©2016 [employer] ° °

## 2016-06-30 ENCOUNTER — Ambulatory Visit
Admission: RE | Admit: 2016-06-30 | Discharge: 2016-06-30 | Disposition: A | Discharge status: Home / Self Care | Payer: Medicare Other | Source: Ambulatory Visit | Attending: Specialist | Admitting: Specialist

## 2016-06-30 ENCOUNTER — Ambulatory Visit: Admission: RE | Admit: 2016-06-30 | Payer: Medicare Other | Source: Ambulatory Visit

## 2016-06-30 ENCOUNTER — Ambulatory Visit
Admission: RE | Admit: 2016-06-30 | Discharge: 2016-06-30 | Disposition: A | Discharge status: Home / Self Care | Payer: Medicare Other | Source: Ambulatory Visit | Attending: Interventional Radiology | Admitting: Interventional Radiology

## 2016-06-30 DIAGNOSIS — Z9889 Other specified postprocedural states: Secondary | ICD-10-CM | POA: Insufficient documentation

## 2016-06-30 DIAGNOSIS — J90 Pleural effusion, not elsewhere classified: Secondary | ICD-10-CM

## 2016-07-06 ENCOUNTER — Encounter: Payer: Self-pay | Admitting: Cardiothoracic Surgery

## 2016-07-06 ENCOUNTER — Ambulatory Visit (INDEPENDENT_AMBULATORY_CARE_PROVIDER_SITE_OTHER): Payer: Medicare Other | Admitting: Cardiothoracic Surgery

## 2016-07-06 VITALS — BP 113/68 | HR 89 | Temp 97.8°F | Ht 67.0 in | Wt 162.0 lb

## 2016-07-06 DIAGNOSIS — J90 Pleural effusion, not elsewhere classified: Secondary | ICD-10-CM

## 2016-07-06 NOTE — Patient Instructions (Signed)
Please give [place] a call once you decide on what to do or if you have any questions.

## 2016-07-06 NOTE — Progress Notes (Signed)
Patient ID: Donald Henry, male   DOB: Apr 30, 1930, 82 y.o.   MRN: [id number]  Chief Complaint  Patient presents with  . Other    Right Recurrent Pleural Effusion    Referred By Dr. Raul Del Reason for Referral Recurrent right plerual effusion  HPI Location, Quality, Duration, Severity, Timing, Context, Modifying Factors, Associated Signs and Symptoms.  Donald Henry is a 82 y.o. male.  This patient was originally to be seen by me several months ago but failed to keep that appointment. He states that he has a history of recurrent right-sided pleural effusions and has a weakly thoracentesis done approximately 2 L per week on the right side. He states that the cause of his recurrent pleural effusion is heart failure. He notices that when he has his thoracentesis performed he feels significantly better. His son notes that over the course of several days after the thoracentesis he believes that his father has increasing shortness of breath. He thinks that having a more frequent drainage schedule would be beneficial. He does have a history of pulmonary emboli for which he takes apixaban.  He does not have this stopped prior to his thoracentesis. He is independent. He lives at home. He does have 2 sons who are participants in his care.   Past Medical History  Diagnosis Date  . Diabetes mellitus without complication ([place])   . SSS (sick sinus syndrome) ([facility])     a. s/p MDT ADSRO1 Single Lead PPM.  . Myocardial infarction [facility])     a. MI @ age 53-->conservatively managed.  Has never had a cath.  Reports multiple negative stress tests.  . Ischemic cardiomyopathy   . Chronic combined systolic (congestive) and diastolic (congestive) heart failure ([place])     a. 01/2015 Echo: EF 40%, mod MR/PR/TR;  b. 10/2015 Echo: EF 20-25%, sev MR, sev dil LA/RA, sev TR.  Marland Kitchen Hypertensive heart disease   . Pulmonary embolism ([place])     a. 10/2015 ->Eliquis.  . Normocytic anemia   . Peptic ulcer disease   .  Obesity   . CKD (chronic kidney disease), stage III   . BPH (benign prostatic hyperplasia)   . Recurrent right pleural effusion     a. 01/28/2016 1.7L fluid removal via thoracentesis; b. 02/19/2016 2.4L removal via thoracentesis.  . Severe mitral regurgitation     a. 10/2015 Echo: sev MR.  . Hiatal hernia     Past Surgical History  Procedure Laterality Date  . Insert / replace / remove pacemaker      Medtronic  Serial # [id number]    Family History  Problem Relation Age of Onset  . Heart disease Mother   . Heart disease Father     a. First MI @ 55.  . Leukemia Sister   . Diabetes Mellitus II Sister   . Diabetes Mellitus II Brother   . Heart attack Son     a. MI @ 64  . Heart attack Son     a. MI @ 71.    Social History Social History  Substance Use Topics  . Smoking status: Never Smoker   . Smokeless tobacco: None  . Alcohol Use: No    Allergies  Allergen Reactions  . Aspirin     Other reaction(s): Other (See Comments) Hx of bleeding ulcer at 82yrs old  . Atorvastatin Other (See Comments)    Current Outpatient Prescriptions  Medication Sig Dispense Refill  . apixaban (ELIQUIS) 2.5 MG TABS tablet Take 2.5 mg  by mouth 2 (two) times daily.    Marland Kitchen atorvastatin (LIPITOR) 40 MG tablet Take 1 tablet (40 mg total) by mouth daily. 30 tablet 0  . carvedilol (COREG) 6.25 MG tablet Take 1 tablet (6.25 mg total) by mouth 2 (two) times daily. 30 tablet 6  . chlorthalidone (HYGROTON) 25 MG tablet Take 25 mg by mouth daily.    Marland Kitchen glipiZIDE (GLUCOTROL) 10 MG tablet Take 10 mg by mouth daily before breakfast.    . isosorbide mononitrate (IMDUR) 30 MG 24 hr tablet Take 30 mg by mouth daily.    Marland Kitchen LORazepam (ATIVAN) 0.5 MG tablet Take 0.5 mg by mouth every 8 (eight) hours as needed for anxiety.    Marland Kitchen terazosin (HYTRIN) 2 MG capsule Take 2 mg by mouth at bedtime.    . torsemide (DEMADEX) 20 MG tablet Take 1 tablet (20 mg total) by mouth daily. 30 tablet 6  . traMADol  (ULTRAM) 50 MG tablet Take 50 mg by mouth every 6 (six) hours as needed. Reported on 04/28/2016    . vitamin B-12 (CYANOCOBALAMIN) 1000 MCG tablet Take 1,000 mcg by mouth daily.    Marland Kitchen zolpidem (AMBIEN) 5 MG tablet Take 5 mg by mouth at bedtime as needed for sleep.     No current facility-administered medications for this visit.      Review of Systems A complete review of systems was asked and was negative except for the following positive findingsWeight loss, chest pain, swelling of his legs, frequent cough, shortness of breath, wheezing, depression, excessive thirst, easy bruising.  Blood pressure 113/68, pulse 89, temperature 97.8 F (36.6 C), temperature source Oral, height 5\' 7"  (1.702 m), weight 162 lb (73.483 kg).  Physical Exam CONSTITUTIONAL:  Pleasant, well-developed, well-nourished, and in no acute distress. EYES: Pupils equal and reactive to light, Sclera non-icteric EARS, NOSE, MOUTH AND THROAT:  The oropharynx was clear.  Dentition is good repair.  Oral mucosa pink and moist. LYMPH NODES:  Lymph nodes in the neck and axillae were normal RESPIRATORY:  Lungs were clear.  Normal respiratory effort without pathologic use of accessory muscles of respiration CARDIOVASCULAR: Heart was regular without murmurs.  There were no carotid bruits. GI: The abdomen was soft, nontender, and nondistended. There were no palpable masses. There was no hepatosplenomegaly. There were normal bowel sounds in all quadrants.  Easily reducible umbilical hernia GU:  Rectal deferred.   MUSCULOSKELETAL:  Normal muscle strength and tone.  No clubbing or cyanosis.   SKIN:  There were no pathologic skin lesions.  There were no nodules on palpation. NEUROLOGIC:  Sensation is normal.  Cranial nerves are grossly intact. PSYCH:  Oriented to person, place and time.  Mood and affect are normal.  Data Reviewed Chest xray  I have personally reviewed the patient's imaging, laboratory findings and medical records.     Assessment    I have independently reviewed his chest x-rays. There is a recurrent right-sided pleural effusion without any lung mass.    Plan    I had a long discussion today with the patient and his son. We reviewed the indications and risks of Pleurx catheter insertion. Because talc is no longer available this is not an option for him. I explained to him in detail the risks of Pleurx catheter insertion as well as the long-term maintenance. He would like to think about his options. He and his son will discuss this and he will contact me if he wishes to proceed.      Christia Reading  Genevive Bi, MD 07/06/2016, 12:35 PM

## 2016-07-07 ENCOUNTER — Other Ambulatory Visit: Payer: Self-pay | Admitting: Specialist

## 2016-07-07 ENCOUNTER — Ambulatory Visit
Admission: RE | Admit: 2016-07-07 | Discharge: 2016-07-07 | Disposition: A | Discharge status: Home / Self Care | Payer: Medicare Other | Source: Ambulatory Visit | Attending: Interventional Radiology | Admitting: Interventional Radiology

## 2016-07-07 ENCOUNTER — Ambulatory Visit
Admission: RE | Admit: 2016-07-07 | Discharge: 2016-07-07 | Disposition: A | Discharge status: Home / Self Care | Payer: Medicare Other | Source: Ambulatory Visit | Attending: Specialist | Admitting: Specialist

## 2016-07-07 DIAGNOSIS — J90 Pleural effusion, not elsewhere classified: Secondary | ICD-10-CM

## 2016-07-07 DIAGNOSIS — I252 Old myocardial infarction: Secondary | ICD-10-CM

## 2016-07-07 DIAGNOSIS — Z950 Presence of cardiac pacemaker: Secondary | ICD-10-CM

## 2016-07-07 DIAGNOSIS — E1122 Type 2 diabetes mellitus with diabetic chronic kidney disease: Secondary | ICD-10-CM | POA: Diagnosis present

## 2016-07-07 DIAGNOSIS — I130 Hypertensive heart and chronic kidney disease with heart failure and stage 1 through stage 4 chronic kidney disease, or unspecified chronic kidney disease: Secondary | ICD-10-CM | POA: Diagnosis present

## 2016-07-07 DIAGNOSIS — D638 Anemia in other chronic diseases classified elsewhere: Secondary | ICD-10-CM | POA: Diagnosis present

## 2016-07-07 DIAGNOSIS — I472 Ventricular tachycardia: Secondary | ICD-10-CM | POA: Diagnosis not present

## 2016-07-07 DIAGNOSIS — Z515 Encounter for palliative care: Secondary | ICD-10-CM | POA: Diagnosis present

## 2016-07-07 DIAGNOSIS — I495 Sick sinus syndrome: Secondary | ICD-10-CM | POA: Diagnosis present

## 2016-07-07 DIAGNOSIS — Z66 Do not resuscitate: Secondary | ICD-10-CM | POA: Diagnosis present

## 2016-07-07 DIAGNOSIS — Z7901 Long term (current) use of anticoagulants: Secondary | ICD-10-CM

## 2016-07-07 DIAGNOSIS — N183 Chronic kidney disease, stage 3 (moderate): Secondary | ICD-10-CM | POA: Diagnosis present

## 2016-07-07 DIAGNOSIS — Z9889 Other specified postprocedural states: Secondary | ICD-10-CM | POA: Insufficient documentation

## 2016-07-07 DIAGNOSIS — Z7984 Long term (current) use of oral hypoglycemic drugs: Secondary | ICD-10-CM

## 2016-07-07 DIAGNOSIS — Z86711 Personal history of pulmonary embolism: Secondary | ICD-10-CM

## 2016-07-07 DIAGNOSIS — I5042 Chronic combined systolic (congestive) and diastolic (congestive) heart failure: Secondary | ICD-10-CM | POA: Diagnosis present

## 2016-07-07 DIAGNOSIS — I081 Rheumatic disorders of both mitral and tricuspid valves: Secondary | ICD-10-CM | POA: Diagnosis present

## 2016-07-07 DIAGNOSIS — Z79899 Other long term (current) drug therapy: Secondary | ICD-10-CM

## 2016-07-07 DIAGNOSIS — Z794 Long term (current) use of insulin: Secondary | ICD-10-CM

## 2016-07-07 DIAGNOSIS — F419 Anxiety disorder, unspecified: Secondary | ICD-10-CM | POA: Diagnosis present

## 2016-07-07 DIAGNOSIS — Z8711 Personal history of peptic ulcer disease: Secondary | ICD-10-CM

## 2016-07-07 DIAGNOSIS — N400 Enlarged prostate without lower urinary tract symptoms: Secondary | ICD-10-CM | POA: Diagnosis present

## 2016-07-07 DIAGNOSIS — J939 Pneumothorax, unspecified: Principal | ICD-10-CM | POA: Diagnosis present

## 2016-07-07 DIAGNOSIS — I482 Chronic atrial fibrillation: Secondary | ICD-10-CM | POA: Diagnosis present

## 2016-07-07 DIAGNOSIS — I255 Ischemic cardiomyopathy: Secondary | ICD-10-CM | POA: Diagnosis present

## 2016-07-07 DIAGNOSIS — I2510 Atherosclerotic heart disease of native coronary artery without angina pectoris: Secondary | ICD-10-CM | POA: Diagnosis present

## 2016-07-07 NOTE — Procedures (Signed)
Successful US guided right sided thoracentesis yielding 2.6 L of serous pleural fluid.   Samples sent to lab for analysis. EBL: None No immediate complications.  Ronny Bacon, MD Pager #: [phone]

## 2016-07-08 ENCOUNTER — Other Ambulatory Visit: Payer: Self-pay | Admitting: Cardiothoracic Surgery

## 2016-07-08 ENCOUNTER — Telehealth: Payer: Self-pay | Admitting: Cardiothoracic Surgery

## 2016-07-08 DIAGNOSIS — I5023 Acute on chronic systolic (congestive) heart failure: Secondary | ICD-10-CM

## 2016-07-08 DIAGNOSIS — I255 Ischemic cardiomyopathy: Secondary | ICD-10-CM

## 2016-07-08 DIAGNOSIS — I2699 Other pulmonary embolism without acute cor pulmonale: Secondary | ICD-10-CM

## 2016-07-08 DIAGNOSIS — J90 Pleural effusion, not elsewhere classified: Secondary | ICD-10-CM

## 2016-07-08 NOTE — Telephone Encounter (Signed)
Patient saw Dr Genevive Bi in the office on Tuesday July 11th. Patients son, Cecilie Lowers called to advise that patient has decided he is ready to proceed with surgery - having the chest tube placed. He also stated that Dr Genevive Bi had mentioned during the appointment that patient has a hernia and wanted to know if that could be taken care of at the same time.

## 2016-07-09 ENCOUNTER — Inpatient Hospital Stay: Admission: RE | Admit: 2016-07-09 | Payer: Medicare Other | Source: Ambulatory Visit

## 2016-07-09 ENCOUNTER — Other Ambulatory Visit
Admission: RE | Admit: 2016-07-09 | Discharge: 2016-07-09 | Disposition: A | Discharge status: Home / Self Care | Payer: Medicare Other | Source: Ambulatory Visit | Attending: Cardiothoracic Surgery | Admitting: Cardiothoracic Surgery

## 2016-07-09 ENCOUNTER — Inpatient Hospital Stay
Admission: AD | Admit: 2016-07-09 | Discharge: 2016-07-13 | DRG: 200 | Disposition: A | Discharge status: Hospice | Payer: Medicare Other | Source: Ambulatory Visit | Attending: Cardiothoracic Surgery | Admitting: Cardiothoracic Surgery

## 2016-07-09 ENCOUNTER — Ambulatory Visit
Admission: RE | Admit: 2016-07-09 | Discharge: 2016-07-09 | Disposition: A | Discharge status: Home / Self Care | Payer: Medicare Other | Source: Ambulatory Visit | Attending: Specialist | Admitting: Specialist

## 2016-07-09 ENCOUNTER — Encounter: Payer: Self-pay | Admitting: Cardiothoracic Surgery

## 2016-07-09 ENCOUNTER — Other Ambulatory Visit: Payer: Self-pay

## 2016-07-09 ENCOUNTER — Telehealth: Payer: Self-pay

## 2016-07-09 ENCOUNTER — Encounter: Payer: Self-pay | Admitting: Internal Medicine

## 2016-07-09 ENCOUNTER — Ambulatory Visit (INDEPENDENT_AMBULATORY_CARE_PROVIDER_SITE_OTHER): Payer: Medicare Other | Admitting: Cardiothoracic Surgery

## 2016-07-09 ENCOUNTER — Ambulatory Visit
Admission: RE | Admit: 2016-07-09 | Discharge: 2016-07-09 | Disposition: A | Discharge status: Home / Self Care | Payer: Medicare Other | Source: Ambulatory Visit | Attending: Cardiothoracic Surgery | Admitting: Cardiothoracic Surgery

## 2016-07-09 VITALS — BP 111/65 | HR 65 | Temp 98.3°F | Ht 67.0 in | Wt 162.0 lb

## 2016-07-09 DIAGNOSIS — J90 Pleural effusion, not elsewhere classified: Secondary | ICD-10-CM

## 2016-07-09 DIAGNOSIS — Z66 Do not resuscitate: Secondary | ICD-10-CM | POA: Diagnosis present

## 2016-07-09 DIAGNOSIS — R0602 Shortness of breath: Secondary | ICD-10-CM | POA: Diagnosis present

## 2016-07-09 DIAGNOSIS — I482 Chronic atrial fibrillation, unspecified: Secondary | ICD-10-CM | POA: Diagnosis present

## 2016-07-09 DIAGNOSIS — I495 Sick sinus syndrome: Secondary | ICD-10-CM | POA: Diagnosis present

## 2016-07-09 DIAGNOSIS — I252 Old myocardial infarction: Secondary | ICD-10-CM | POA: Diagnosis not present

## 2016-07-09 DIAGNOSIS — J9383 Other pneumothorax: Secondary | ICD-10-CM

## 2016-07-09 DIAGNOSIS — I255 Ischemic cardiomyopathy: Secondary | ICD-10-CM

## 2016-07-09 DIAGNOSIS — Z950 Presence of cardiac pacemaker: Secondary | ICD-10-CM

## 2016-07-09 DIAGNOSIS — Z9889 Other specified postprocedural states: Secondary | ICD-10-CM

## 2016-07-09 DIAGNOSIS — J9811 Atelectasis: Secondary | ICD-10-CM | POA: Insufficient documentation

## 2016-07-09 DIAGNOSIS — N183 Chronic kidney disease, stage 3 unspecified: Secondary | ICD-10-CM | POA: Diagnosis present

## 2016-07-09 DIAGNOSIS — Z01810 Encounter for preprocedural cardiovascular examination: Secondary | ICD-10-CM | POA: Diagnosis not present

## 2016-07-09 DIAGNOSIS — Z794 Long term (current) use of insulin: Secondary | ICD-10-CM | POA: Diagnosis not present

## 2016-07-09 DIAGNOSIS — E1122 Type 2 diabetes mellitus with diabetic chronic kidney disease: Secondary | ICD-10-CM | POA: Diagnosis present

## 2016-07-09 DIAGNOSIS — Z515 Encounter for palliative care: Secondary | ICD-10-CM | POA: Diagnosis present

## 2016-07-09 DIAGNOSIS — Z09 Encounter for follow-up examination after completed treatment for conditions other than malignant neoplasm: Secondary | ICD-10-CM

## 2016-07-09 DIAGNOSIS — I119 Hypertensive heart disease without heart failure: Secondary | ICD-10-CM

## 2016-07-09 DIAGNOSIS — J939 Pneumothorax, unspecified: Secondary | ICD-10-CM | POA: Diagnosis present

## 2016-07-09 DIAGNOSIS — Z7901 Long term (current) use of anticoagulants: Secondary | ICD-10-CM | POA: Diagnosis not present

## 2016-07-09 DIAGNOSIS — F419 Anxiety disorder, unspecified: Secondary | ICD-10-CM | POA: Diagnosis present

## 2016-07-09 DIAGNOSIS — D638 Anemia in other chronic diseases classified elsewhere: Secondary | ICD-10-CM

## 2016-07-09 DIAGNOSIS — I5042 Chronic combined systolic (congestive) and diastolic (congestive) heart failure: Secondary | ICD-10-CM | POA: Diagnosis present

## 2016-07-09 DIAGNOSIS — Z86711 Personal history of pulmonary embolism: Secondary | ICD-10-CM | POA: Diagnosis not present

## 2016-07-09 DIAGNOSIS — I5023 Acute on chronic systolic (congestive) heart failure: Secondary | ICD-10-CM

## 2016-07-09 DIAGNOSIS — Z8711 Personal history of peptic ulcer disease: Secondary | ICD-10-CM | POA: Diagnosis not present

## 2016-07-09 DIAGNOSIS — I472 Ventricular tachycardia: Secondary | ICD-10-CM | POA: Diagnosis not present

## 2016-07-09 DIAGNOSIS — I081 Rheumatic disorders of both mitral and tricuspid valves: Secondary | ICD-10-CM | POA: Diagnosis present

## 2016-07-09 DIAGNOSIS — I130 Hypertensive heart and chronic kidney disease with heart failure and stage 1 through stage 4 chronic kidney disease, or unspecified chronic kidney disease: Secondary | ICD-10-CM | POA: Diagnosis present

## 2016-07-09 DIAGNOSIS — N400 Enlarged prostate without lower urinary tract symptoms: Secondary | ICD-10-CM | POA: Diagnosis present

## 2016-07-09 DIAGNOSIS — I2510 Atherosclerotic heart disease of native coronary artery without angina pectoris: Secondary | ICD-10-CM | POA: Diagnosis present

## 2016-07-09 DIAGNOSIS — Z7984 Long term (current) use of oral hypoglycemic drugs: Secondary | ICD-10-CM | POA: Diagnosis not present

## 2016-07-09 DIAGNOSIS — Z79899 Other long term (current) drug therapy: Secondary | ICD-10-CM | POA: Diagnosis not present

## 2016-07-09 LAB — GLUCOSE, CAPILLARY
GLUCOSE-CAPILLARY: 82 mg/dL (ref 65–99)
GLUCOSE-CAPILLARY: 168 mg/dL — AB (ref 65–99)

## 2016-07-09 LAB — APTT: APTT: 40 s — AB (ref 24–36)

## 2016-07-09 LAB — COMPREHENSIVE METABOLIC PANEL
ALBUMIN: 3.1 g/dL — AB (ref 3.5–5.0)
ALT: 13 U/L — ABNORMAL LOW (ref 17–63)
ANION GAP: 7 (ref 5–15)
AST: 20 U/L (ref 15–41)
Alkaline Phosphatase: 149 U/L — ABNORMAL HIGH (ref 38–126)
BILIRUBIN TOTAL: 1.3 mg/dL — AB (ref 0.3–1.2)
BUN: 38 mg/dL — ABNORMAL HIGH (ref 6–20)
CO2: 30 mmol/L (ref 22–32)
Calcium: 8.6 mg/dL — ABNORMAL LOW (ref 8.9–10.3)
Chloride: 97 mmol/L — ABNORMAL LOW (ref 101–111)
Creatinine, Ser: 1.82 mg/dL — ABNORMAL HIGH (ref 0.61–1.24)
GFR calc non Af Amer: 32 mL/min — ABNORMAL LOW (ref >60)
GFR, EST AFRICAN AMERICAN: 38 mL/min — AB (ref >60)
GLUCOSE: 188 mg/dL — AB (ref 65–99)
POTASSIUM: 4.8 mmol/L (ref 3.5–5.1)
SODIUM: 134 mmol/L — AB (ref 135–145)
TOTAL PROTEIN: 6.4 g/dL — AB (ref 6.5–8.1)

## 2016-07-09 LAB — CBC WITH DIFFERENTIAL/PLATELET
BASOS ABS: 0.1 10*3/uL (ref 0–0.1)
BASOS PCT: 1 %
Eosinophils Absolute: 0.3 10*3/uL (ref 0–0.7)
Eosinophils Relative: 4 %
HCT: 28.4 % — ABNORMAL LOW (ref 40.0–52.0)
Hemoglobin: 9.8 g/dL — ABNORMAL LOW (ref 13.0–18.0)
Lymphocytes Relative: 8 %
Lymphs Abs: 0.6 10*3/uL — ABNORMAL LOW (ref 1.0–3.6)
MCH: 31.2 pg (ref 26.0–34.0)
MCHC: 34.6 g/dL (ref 32.0–36.0)
MCV: 90.1 fL (ref 80.0–100.0)
Monocytes Absolute: 0.5 10*3/uL (ref 0.2–1.0)
Monocytes Relative: 7 %
NEUTROS ABS: 5.5 10*3/uL (ref 1.4–6.5)
NEUTROS PCT: 80 %
Platelets: 242 10*3/uL (ref 150–440)
RBC: 3.15 MIL/uL — ABNORMAL LOW (ref 4.40–5.90)
RDW: 18.2 % — AB (ref 11.5–14.5)
WBC: 6.9 10*3/uL (ref 3.8–10.6)

## 2016-07-09 LAB — PROTIME-INR
INR: 1.66
Prothrombin Time: 19.6 seconds — ABNORMAL HIGH (ref 11.4–15.0)

## 2016-07-09 MED ORDER — CARVEDILOL 6.25 MG PO TABS
6.2500 mg | ORAL_TABLET | Freq: Two times a day (BID) | ORAL | Status: DC
Start: 2016-07-09 — End: 2016-07-13
  Administered 2016-07-09 – 2016-07-13 (×7): 6.25 mg via ORAL
  Filled 2016-07-09 (×7): qty 1

## 2016-07-09 MED ORDER — ALBUTEROL SULFATE (2.5 MG/3ML) 0.083% IN NEBU
2.5000 mg | INHALATION_SOLUTION | RESPIRATORY_TRACT | Status: DC
  Administered 2016-07-09: 2.5 mg via RESPIRATORY_TRACT
  Filled 2016-07-09 (×2): qty 3

## 2016-07-09 MED ORDER — INSULIN ASPART 100 UNIT/ML ~~LOC~~ SOLN
0.0000 [IU] | Freq: Every day | SUBCUTANEOUS | Status: DC
Start: 2016-07-09 — End: 2016-07-13
  Administered 2016-07-11: 4 [IU] via SUBCUTANEOUS
  Filled 2016-07-09: qty 4

## 2016-07-09 MED ORDER — TORSEMIDE 20 MG PO TABS
20.0000 mg | ORAL_TABLET | Freq: Every day | ORAL | Status: DC
  Administered 2016-07-09 – 2016-07-13 (×5): 20 mg via ORAL
  Filled 2016-07-09 (×5): qty 1

## 2016-07-09 MED ORDER — DEXTROSE-NACL 5-0.45 % IV SOLN
INTRAVENOUS | Status: DC
  Administered 2016-07-09 – 2016-07-11 (×4): via INTRAVENOUS

## 2016-07-09 MED ORDER — ISOSORBIDE MONONITRATE ER 30 MG PO TB24
30.0000 mg | ORAL_TABLET | Freq: Every day | ORAL | Status: DC
  Administered 2016-07-09 – 2016-07-13 (×4): 30 mg via ORAL
  Filled 2016-07-09 (×5): qty 1

## 2016-07-09 MED ORDER — LORAZEPAM 0.5 MG PO TABS
0.5000 mg | ORAL_TABLET | Freq: Three times a day (TID) | ORAL | Status: DC | PRN
  Administered 2016-07-09 – 2016-07-11 (×2): 0.5 mg via ORAL
  Filled 2016-07-09 (×2): qty 1

## 2016-07-09 MED ORDER — ALBUTEROL SULFATE (2.5 MG/3ML) 0.083% IN NEBU: 2.5000 mg | INHALATION_SOLUTION | RESPIRATORY_TRACT | Status: DC | PRN

## 2016-07-09 MED ORDER — ONDANSETRON HCL 4 MG/2ML IJ SOLN: 4.0000 mg | Freq: Four times a day (QID) | INTRAMUSCULAR | Status: DC | PRN

## 2016-07-09 MED ORDER — INSULIN ASPART 100 UNIT/ML ~~LOC~~ SOLN
0.0000 [IU] | Freq: Three times a day (TID) | SUBCUTANEOUS | Status: DC
  Administered 2016-07-10: 2 [IU] via SUBCUTANEOUS
  Administered 2016-07-10: 3 [IU] via SUBCUTANEOUS
  Administered 2016-07-11 – 2016-07-12 (×4): 2 [IU] via SUBCUTANEOUS
  Administered 2016-07-12: 1 [IU] via SUBCUTANEOUS
  Administered 2016-07-13: 2 [IU] via SUBCUTANEOUS
  Filled 2016-07-09: qty 2
  Filled 2016-07-09: qty 3
  Filled 2016-07-09: qty 2
  Filled 2016-07-09: qty 1
  Filled 2016-07-09 (×2): qty 2
  Filled 2016-07-09: qty 3
  Filled 2016-07-09: qty 2

## 2016-07-09 MED ORDER — ZOLPIDEM TARTRATE 5 MG PO TABS
5.0000 mg | ORAL_TABLET | Freq: Every day | ORAL | Status: DC
  Administered 2016-07-09 – 2016-07-12 (×4): 5 mg via ORAL
  Filled 2016-07-09 (×4): qty 1

## 2016-07-09 MED ORDER — GLIPIZIDE 10 MG PO TABS
10.0000 mg | ORAL_TABLET | Freq: Every day | ORAL | Status: DC
  Administered 2016-07-10: 10 mg via ORAL
  Filled 2016-07-09: qty 1

## 2016-07-09 NOTE — Consult Note (Signed)
[facility] NAME: Donald Henry    MR#:  [id number]  DATE OF BIRTH:  [birth year]  DATE OF ADMISSION:  07/09/2016  PRIMARY CARE PHYSICIAN: Madelyn Brunner, MD   REQUESTING/REFERRING PHYSICIAN: Dr Genevive Bi  CHIEF COMPLAINT:  Sent in for pneumothorax  HISTORY OF PRESENT ILLNESS:  Donald Henry  is a 82 y.o. male with a known history of Recurrent right pleural effusion status post mini thoracentesis in the past. He had a thoracentesis on Wednesday which removed 2.6 L. At that time had a small tiny pneumothorax. He saw Dr. Genevive Bi today and repeat chest x-ray showed that the pneumothorax had increased to 15%. He was brought in for further monitoring and testing. He will likely have a Pleurx catheter placed on Monday. Patient has some shortness of breath. No chest pain. He feels okay. Hospitalist services were contacted for medical management.  PAST MEDICAL HISTORY:   Past Medical History  Diagnosis Date  . Diabetes mellitus without complication ([place])   . SSS (sick sinus syndrome) ([facility])     a. s/p MDT ADSRO1 Single Lead PPM.  . Myocardial infarction [facility])     a. MI @ age 38-->conservatively managed.  Has never had a cath.  Reports multiple negative stress tests.  . Ischemic cardiomyopathy   . Chronic combined systolic (congestive) and diastolic (congestive) heart failure ([place])     a. 01/2015 Echo: EF 40%, mod MR/PR/TR;  b. 10/2015 Echo: EF 20-25%, sev MR, sev dil LA/RA, sev TR.  Marland Kitchen Hypertensive heart disease   . Pulmonary embolism ([place])     a. 10/2015 ->Eliquis.  . Normocytic anemia   . Peptic ulcer disease   . Obesity   . CKD (chronic kidney disease), stage III   . BPH (benign prostatic hyperplasia)   . Recurrent right pleural effusion     a. 01/28/2016 1.7L fluid removal via thoracentesis; b. 02/19/2016 2.4L removal via thoracentesis.  . Severe mitral regurgitation     a. 10/2015 Echo: sev MR.  . Hiatal hernia     PAST SURGICAL  HISTOIRY:   Past Surgical History  Procedure Laterality Date  . Insert / replace / remove pacemaker  04/19/2014    Medtronic  Serial # [id number]    SOCIAL HISTORY:   Social History  Substance Use Topics  . Smoking status: Never Smoker   . Smokeless tobacco: Never Used  . Alcohol Use: No    FAMILY HISTORY:   Family History  Problem Relation Age of Onset  . Heart disease Mother   . Heart disease Father     a. First MI @ 30.  . Leukemia Sister   . Diabetes Mellitus II Sister   . Diabetes Mellitus II Brother   . Heart attack Son     a. MI @ 62  . Heart attack Son     a. MI @ 98.    DRUG ALLERGIES:   Allergies  Allergen Reactions  . Atorvastatin Other (See Comments)    Severe Muscle Cramping  . Aspirin Other (See Comments)    Bleeding from Peptic Ulcer in 1963.    REVIEW OF SYSTEMS:  CONSTITUTIONAL: No fever, fatigue or weakness. Cold feeling. Positive for weight loss. EYES: No blurred or double vision. Wears glasses. EARS, NOSE, AND THROAT: No tinnitus or ear pain.  RESPIRATORY: Positive for cough, positive for shortness of breath. No wheezing or hemoptysis.  CARDIOVASCULAR: No chest pain, orthopnea, edema.  GASTROINTESTINAL: No nausea,  vomiting, diarrhea or abdominal pain.  GENITOURINARY: No dysuria, hematuria.  ENDOCRINE: No polyuria, nocturia,  HEMATOLOGY: No anemia, easy bruising or bleeding SKIN: No rash or lesion. MUSCULOSKELETAL: No joint pain or arthritis.   NEUROLOGIC: No tingling, numbness, weakness.  PSYCHIATRY: Positive for anxiety.   MEDICATIONS AT HOME:   Prior to Admission medications   Medication Sig Start Date End Date Taking? Authorizing Provider  apixaban (ELIQUIS) 2.5 MG TABS tablet Take 2.5 mg by mouth 2 (two) times daily.    Historical Provider, MD  carvedilol (COREG) 6.25 MG tablet Take 1 tablet (6.25 mg total) by mouth 2 (two) times daily. 03/22/16   Wende Bushy, MD  glipiZIDE (GLUCOTROL) 10 MG tablet Take 10 mg by  mouth daily before breakfast.    Historical Provider, MD  isosorbide mononitrate (IMDUR) 30 MG 24 hr tablet Take 30 mg by mouth daily.    Historical Provider, MD  LORazepam (ATIVAN) 0.5 MG tablet Take 0.5 mg by mouth every 8 (eight) hours as needed for anxiety.    Historical Provider, MD  torsemide (DEMADEX) 20 MG tablet Take 1 tablet (20 mg total) by mouth daily. 04/20/16   Wende Bushy, MD  traMADol (ULTRAM) 50 MG tablet Take 50 mg by mouth every 6 (six) hours as needed. Reported on 04/28/2016    Historical Provider, MD  vitamin B-12 (CYANOCOBALAMIN) 1000 MCG tablet Take 1,000 mcg by mouth daily.    Historical Provider, MD  zolpidem (AMBIEN) 5 MG tablet Take 5 mg by mouth at bedtime as needed for sleep.    Historical Provider, MD      VITAL SIGNS:  Height 5\' 7"  (1.702 m), weight 71.578 kg (157 lb 12.8 oz).  PHYSICAL EXAMINATION:  GENERAL:  82 y.o.-year-old patient lying in the bed with no acute distress.  EYES: Pupils equal, round, reactive to light and accommodation. No scleral icterus. Extraocular muscles intact.  HEENT: Head atraumatic, normocephalic. Oropharynx and nasopharynx clear.  NECK:  Supple, no jugular venous distention. No thyroid enlargement, no tenderness.  LUNGS: Decreased breath sounds right lung especially at the base, no wheezing, rales,rhonchi or crepitation. No use of accessory muscles of respiration.  CARDIOVASCULAR: S1, S2 normal. 2/6 systolic murmurs. No rubs, or gallops.  ABDOMEN: Soft, nontender, nondistended. Bowel sounds present. No organomegaly or mass.  EXTREMITIES: Trace edema. No cyanosis, or clubbing.  NEUROLOGIC: Cranial nerves II through XII are intact. Muscle strength 5/5 in all extremities. Sensation intact. Gait not checked.  PSYCHIATRIC: The patient is alert and oriented x 3.  SKIN: No obvious rash, lesion, or ulcer.   LABORATORY PANEL:   CBC  Recent Labs Lab 07/09/16 1001  WBC 6.9  HGB 9.8*  HCT 28.4*  PLT 242    ------------------------------------------------------------------------------------------------------------------  Chemistries   Recent Labs Lab 07/09/16 1001  NA 134*  K 4.8  CL 97*  CO2 30  GLUCOSE 188*  BUN 38*  CREATININE 1.82*  CALCIUM 8.6*  AST 20  ALT 13*  ALKPHOS 149*  BILITOT 1.3*   ------------------------------------------------------------------------------------------------------------------   RADIOLOGY:  Dg Chest 1 View  07/07/2016  CLINICAL DATA:  Post large volume right-sided thoracentesis yielding 2.6 L of fluid EXAM: CHEST 1 VIEW COMPARISON:  Chest radiograph - 06/30/2016; 06/23/2016; multiple previous ultrasound-guided thoracentesis. FINDINGS: Grossly unchanged cardiac silhouette and mediastinal contours with atherosclerotic plaque within thoracic aorta. Stable position of support apparatus. Interval reduction of persistent trace right-sided effusion post large volume thoracentesis with development of a small ex vacuo pneumothorax. No mediastinal shift. Improved aeration of the right  lower lung with persistent bibasilar opacities, likely atelectasis. No left-sided effusion. No evidence of edema. No acute osseus abnormalities. IMPRESSION: Interval reduction of persistent trace right-sided effusion post large volume thoracentesis with development of a small ex vacuo pneumothorax. The patient was evaluated by the dictating and performing interventional radiologist and remains asymptomatic. As such, the patient was discharged home. The patient was instructed to return to the emergency department if he were to develop worsening shortness of breath. Electronically Signed   By: Sandi Mariscal M.D.   On: 07/07/2016 15:19   Dg Chest 2 View  07/09/2016  CLINICAL DATA:  Followup pneumothorax. EXAM: CHEST  2 VIEW COMPARISON:  07/07/2016 FINDINGS: The heart is mildly enlarged but stable. Stable tortuosity and calcification of the thoracic aorta. Very low lung volumes could be due  to expiratory chest film. The pneumothorax appears larger but I think it is due to the difference in inspiratory volume compared with the prior study. It is estimated at 15%. There is a persistent small right pleural effusion and overlying atelectasis. IMPRESSION: Persistent 15% right-sided pneumothorax. It appears slightly larger at the apex but is no longer visualized at the right lung base. The difference is likely due to degree of inspiration. Small right effusion and right lower lobe atelectasis. Electronically Signed   By: Marijo Sanes M.D.   On: 07/09/2016 12:31   US Thoracentesis Asp Pleural Space W/img Guide  07/07/2016  INDICATION: History of chronic congestive heart failure with recurrent symptomatic right-sided pleural effusion. Note, patient has been evaluated at the interventional radiology clinic (on 05/05/2016 by Dr. Anselm Pancoast) regarding placement of a tunneled pleural catheter though still remains undecided. EXAM: US THORACENTESIS ASP PLEURAL SPACE W/IMG GUIDE COMPARISON:  Multiple previous ultrasound-guided paracenteses MEDICATIONS: None. COMPLICATIONS: SIR Level A - No therapy, no consequence. Post thoracentesis chest radiograph demonstrated development of small asymptomatic ex vacuo pneumothorax. TECHNIQUE: Informed written consent was obtained from the patient after a discussion of the risks, benefits and alternatives to treatment. A timeout was performed prior to the initiation of the procedure. Initial ultrasound scanning demonstrates a large pleural effusion. The lower chest was prepped and draped in the usual sterile fashion. 1% lidocaine was used for local anesthesia. An ultrasound image was saved for documentation purposes. An 8 Fr Safe-T-Centesis catheter was introduced. The thoracentesis was performed. The catheter was removed and a dressing was applied. The patient tolerated the procedure well without immediate post procedural complication. The patient was escorted to have an upright  chest radiograph. FINDINGS: A total of approximately 2.6 liters of serous fluid was removed. Requested samples were sent to the laboratory. IMPRESSION: Successful ultrasound-guided right sided thoracentesis yielding 2.6 liters of pleural fluid. Procedure complicated by development of a postprocedural asymptomatic ex vacuo pneumothorax following this large volume thoracentesis. Electronically Signed   By: Sandi Mariscal M.D.   On: 07/07/2016 15:21   IMPRESSION AND PLAN:   1. Chronic systolic congestive heart failure. Continue low-dose torsemide and Coreg. We'll consider ACE inhibitor since chronic kidney disease seems stable. 2. History of coronary artery disease. In April 2017 had a high risk stress test. Cardiac catheter not done secondary to chronic kidney disease. Medical management. 3. Chronic kidney disease stage III.  This is stable from previous creatinines. 4. 15% pneumothorax after thoracentesis. Case discussed with Dr. Genevive Bi cardiothoracic surgery and we will watch this at this point. 5. Recurrent right pleural effusion. Eliquis on hold for Pleurx catheter on Monday 6. Type 2 diabetes mellitus on glipizide. We'll put on  sliding scale and check a hemoglobin A1c 7. Anxiety on Ativan 8. Patient is a DO NOT RESUSCITATE 9. Chronic anemia. Send off iron studies. Likely anemia of chronic disease 10. History of pulmonary embolism and history of atrial fibrillation. Higher risk of stroke off eliquis.   All the records are reviewed and case discussed with Consulting provider. Management plans discussed with the patient, family and they are in agreement.  CODE STATUS: DO NOT RESUSCITATE  TOTAL TIME TAKING CARE OF THIS PATIENT: 50 minutes.    Loletha Grayer M.D on 07/09/2016 at 2:04 PM  Between 7am to 6pm - Pager - [phone]  After 6pm go to [email or web address] - password [email or web address]  [facility]  Office  [phone]  CC: Primary care Physician: Madelyn Brunner, MD

## 2016-07-09 NOTE — Progress Notes (Signed)
Patient ID: Donald Henry, male   DOB: Nov 14, 1930, 82 y.o.   MRN: [id number]  No chief complaint on file.   HPI Donald Henry is a 82 y.o. male.  I saw this gentleman earlier in the week and discussed with him the possibility of placing a Pleurx catheter on the right side for recurrent pleural effusions. He underwent a scheduled thoracentesis the following day on Wednesday and 2.6 L of fluid was withdrawn however the post chest x-ray did show a small pneumothorax. The patient follow-up with me today to continue his discussion about Pleurx catheter management and he states that he felt quite well without any shortness of breath and in fact felt significantly better than prior to his thoracentesis. However on physical exam I found that there were diminished breath sounds on the right and a repeat chest x-ray showed a moderate sized right-sided pleural effusion and pneumothorax. Even though the patient was essentially asymptomatic I felt that it was important to bring him into the hospital for further management including management of his anticoagulation, cardiac and kidney issues.   Past Medical History  Diagnosis Date  . Diabetes mellitus without complication ([place])   . SSS (sick sinus syndrome) ([facility])     a. s/p MDT ADSRO1 Single Lead PPM.  . Myocardial infarction [facility])     a. MI @ age 50-->conservatively managed.  Has never had a cath.  Reports multiple negative stress tests.  . Ischemic cardiomyopathy   . Chronic combined systolic (congestive) and diastolic (congestive) heart failure ([place])     a. 01/2015 Echo: EF 40%, mod MR/PR/TR;  b. 10/2015 Echo: EF 20-25%, sev MR, sev dil LA/RA, sev TR.  Marland Kitchen Hypertensive heart disease   . Pulmonary embolism ([place])     a. 10/2015 ->Eliquis.  . Normocytic anemia   . Peptic ulcer disease   . Obesity   . CKD (chronic kidney disease), stage III   . BPH (benign prostatic hyperplasia)   . Recurrent right pleural effusion     a. 01/28/2016 1.7L fluid  removal via thoracentesis; b. 02/19/2016 2.4L removal via thoracentesis.  . Severe mitral regurgitation     a. 10/2015 Echo: sev MR.  . Hiatal hernia     Past Surgical History  Procedure Laterality Date  . Insert / replace / remove pacemaker  04/19/2014    Medtronic  Serial # [id number]    Family History  Problem Relation Age of Onset  . Heart disease Mother   . Heart disease Father     a. First MI @ 30.  . Leukemia Sister   . Diabetes Mellitus II Sister   . Diabetes Mellitus II Brother   . Heart attack Son     a. MI @ 33  . Heart attack Son     a. MI @ 48.    Social History Social History  Substance Use Topics  . Smoking status: Never Smoker   . Smokeless tobacco: Never Used  . Alcohol Use: No    Allergies  Allergen Reactions  . Atorvastatin Other (See Comments)    Severe Muscle Cramping  . Aspirin Other (See Comments)    Bleeding from Peptic Ulcer in 1963.    Current Facility-Administered Medications  Medication Dose Route Frequency Provider Last Rate Last Dose  . albuterol (PROVENTIL) (2.5 MG/3ML) 0.083% nebulizer solution 2.5 mg  2.5 mg Nebulization Q4H while awake [name], MD      . carvedilol (COREG) tablet 6.25 mg  6.25 mg Oral  BID WC [name], MD      . dextrose 5 %-0.45 % sodium chloride infusion   Intravenous Continuous [name], MD      . Derrill Memo ON 07/10/2016] glipiZIDE (GLUCOTROL) tablet 10 mg  10 mg Oral QAC breakfast [name], MD      . insulin aspart (novoLOG) injection 0-5 Units  0-5 Units Subcutaneous QHS Loletha Grayer, MD      . insulin aspart (novoLOG) injection 0-9 Units  0-9 Units Subcutaneous TID WC Loletha Grayer, MD      . isosorbide mononitrate (IMDUR) 24 hr tablet 30 mg  30 mg Oral Daily [name], MD      . LORazepam (ATIVAN) tablet 0.5 mg  0.5 mg Oral Q8H PRN [name], MD      . ondansetron [facility]) injection 4 mg  4 mg Intravenous Q6H PRN [name], MD      . torsemide (DEMADEX) tablet 20 mg  20  mg Oral Daily [name], MD        Location, Quality, Duration, Severity, Timing, Context, Modifying Factors, Associated Signs and Symptoms.  Review of Systems A complete review of systems was asked and was negative except for the following positive findingsShortness of breath prior to his thoracentesis. He denied any shortness of breath at this time.  Height 5\' 7"  (1.702 m), weight 157 lb 12.8 oz (71.578 kg).  Physical Exam CONSTITUTIONAL:  Pleasant, well-developed, well-nourished, and in no acute distress. EYES: Pupils equal and reactive to light, Sclera non-icteric EARS, NOSE, MOUTH AND THROAT:  The oropharynx was clear.  Oral mucosa pink and moist. LYMPH NODES:  Lymph nodes in the neck and axillae were normal RESPIRATORY:  Lungs were clear on the left but diminished on the right..  Normal respiratory effort without pathologic use of accessory muscles of respiration CARDIOVASCULAR: Heart was regular with systolic murmurs.  There were no carotid bruits. GI: The abdomen was soft, nontender, and nondistended. There were no palpable masses. There was no hepatosplenomegaly. There were normal bowel sounds in all quadrants. MUSCULOSKELETAL:  Normal muscle strength and tone.  No clubbing or cyanosis.   SKIN:  There were no pathologic skin lesions.  There were no nodules on palpation. NEUROLOGIC:  Sensation is normal.  Cranial nerves are grossly intact. PSYCH:  Oriented to person, place and time.  Mood and affect are normal.  Data Reviewed Multiple chest x-rays  I have personally reviewed the patient's imaging and medical records.    Assessment    I have independently reviewed the chest x-ray from today. The pneumothorax has increased. Despite the fact that he is asymptomatic I believe it be best to bring him into the hospital for observation and workup of his recurrent pleural effusions.    Plan    I had a chance to talk to the patient and his son again today. I reviewed with them  the indications and risks as well as the management of insertion of a Pleurx catheter. We will bring the patient into the hospital today. We will have our hospitalist evaluate the patient for his multiple comorbid conditions. We will hold his anticoagulation. We will perform his surgery on Monday morning. In addition I have discussed his care with our hospitalist who will come by and see the patient later today.     [name], MD 07/09/2016, 2:16 PM

## 2016-07-09 NOTE — Telephone Encounter (Signed)
Patient's chest x-ray indicates a worsening Pneumothorax. Patient will be directly admitted to hospital from office.  Called [employer] and spoke with Maudie Mercury. Bed 259 given.

## 2016-07-09 NOTE — Patient Instructions (Signed)
You are scheduled for your Pleurex Catheter to be placed on 07/14/16 with Dr. Genevive Bi at [facility].  You are scheduled to see Anesthesia for your Pre-op evaluation on 07/12/16 at [facility] will need to hold your Eliquis for 3 days. The last day that you will take this is on 07/11/16 and then we will resume this medication when it is safe to do so after surgery.  If you have any questions or concerns please call our office.

## 2016-07-09 NOTE — Progress Notes (Signed)
Patient is alert and oriented x 4, sons at bedside, ambulatory in room with stand by assist, on room air, direct admit for right sided pneumothorax, currently off of eliquis since 7/13, plan is to have pleurex catherter placed on Monday and continue to monitor patient for respiratory distress secondary to pneumothorax. Patient resting comfortably at this time. Uneventful shift.

## 2016-07-09 NOTE — Addendum Note (Signed)
Addended by: [name] E on: 07/09/2016 12:18 PM   Modules accepted: Orders

## 2016-07-09 NOTE — Consult Note (Signed)
Cardiology Consultation Note  Patient ID: Donald Henry, MRN: [id number], DOB/AGE: 1930-07-17 82 y.o. Admit date: 07/09/2016   Date of Consult: 07/09/2016 Primary Physician: Madelyn Brunner, MD Primary Cardiologist: Dr. Yvone Neu, MD Requesting Physician: Dr. Faith Rogue, MD Primary Electrophysiologist: Dr. Caryl Comes, MD  Chief Complaint: SOB Reason for Consult: Pre-operative evaluation for planned Pleurx catheter on 07/12/16  HPI: 82 y.o. male with h/o CAD s/p MI at age 89 which was medically managed (never with a prior cardiac cath), chronic Afib on Eliquis, SSS s/p PPM, chronic combined CHF/ICM, history of bilateral PE, recurrent pleural effusions felt to be in the setting of the patient's CHF s/p multiple thoracentesis procedures without issues, CKD stage III, hypertensive heart disease, DM2 who presents to the hospital at the request of Dr. Faith Rogue for planned Pleurx catheter on 07/12/16.   Most recent echo from 02/2016 as below which showed an improved EF from 25% to 25%. He was noted to have increased right-sided pressures at 50 mm Hg. He was last seen by Dr. Faith Rogue on 07/06/16 with planned Pleurx catheter placement discussed. He has issues with recurrent pleural effusion leading to multiple thoracentesis procedure, most recently on 07/07/16 with removal of 2.6 L. Post procedure film showed a development of a small pneumothorax. After this thoracentesis he decided to move forward with the placement of the Pleurx catheter. Repeat CXR today showed persistent pneumothorax. He was sent to the hospital today for clearance work up. He has not had any symptoms concerning for angina.      Upon the patient's arrival to [facility] they were found to have HGB 9.8, SCr 182 (prior in the 2.1-2.2 range). ECG not performed, CXR showed persistent 15% right-sided pneumothorax. She was brought into the hospital at the request of Dr. Faith Rogue, MD for planned Pleurx catheter on Monday, 07/12/16. Cardiology is asked to evaluate for the  procedure.     Past Medical History  Diagnosis Date  . Diabetes mellitus without complication ([place])   . SSS (sick sinus syndrome) (HCC)     a. s/p MDT ADSRO1 Single Lead PPM.  . Myocardial infarction [facility])     a. MI @ age 54-->conservatively managed.  Has never had a cath.  Reports multiple negative stress tests.  . Ischemic cardiomyopathy   . Chronic combined systolic (congestive) and diastolic (congestive) heart failure ([place])     a. 01/2015 Echo: EF 40%, mod MR/PR/TR;  b. 10/2015 Echo: EF 20-25%, sev MR, sev dil LA/RA, sev TR.  Marland Kitchen Hypertensive heart disease   . Pulmonary embolism ([place])     a. 10/2015 ->Eliquis.  . Normocytic anemia   . Peptic ulcer disease   . Obesity   . CKD (chronic kidney disease), stage III   . BPH (benign prostatic hyperplasia)   . Recurrent right pleural effusion     a. 01/28/2016 1.7L fluid removal via thoracentesis; b. 02/19/2016 2.4L removal via thoracentesis.  . Severe mitral regurgitation     a. 10/2015 Echo: sev MR.  . Hiatal hernia       Most Recent Cardiac Studies: Echo 03/16/16: Study Conclusions  - Left ventricle: The cavity size was normal. There was moderate  concentric hypertrophy. Systolic function was moderately to  severely reduced. The estimated ejection fraction was in the  range of 30% to 35%. - Mitral valve: There was moderate regurgitation. - Left atrium: The atrium was moderately dilated. - Right atrium: The atrium was mildly dilated. - Tricuspid valve: There was moderate regurgitation. - Pulmonary arteries: Systolic  pressure was moderately increased.  PA peak pressure: 50 mm Hg (S). - Inferior vena cava: The vessel was severely dilated. The  respirophasic diameter changes were blunted (< 50%), severely  elevated central venous pressure.  Echo 11/19/2015: Study Conclusions  - Left ventricle: The cavity size was severely dilated. Systolic  function was severely reduced. The estimated ejection fraction  was in the  range of 20% to 25%. - Mitral valve: There was severe regurgitation. - Left atrium: The atrium was severely dilated. - Right ventricle: The cavity size was moderately dilated. - Right atrium: The atrium was severely dilated. - Tricuspid valve: There was severe regurgitation.   Surgical History:  Past Surgical History  Procedure Laterality Date  . Insert / replace / remove pacemaker  04/19/2014    [employer]  Serial # [id number]     Home Meds: Prior to Admission medications   Medication Sig Start Date End Date Taking? Authorizing Provider  apixaban (ELIQUIS) 2.5 MG TABS tablet Take 2.5 mg by mouth 2 (two) times daily.    Historical Provider, MD  carvedilol (COREG) 6.25 MG tablet Take 1 tablet (6.25 mg total) by mouth 2 (two) times daily. 03/22/16   Wende Bushy, MD  glipiZIDE (GLUCOTROL) 10 MG tablet Take 10 mg by mouth daily before breakfast.    Historical Provider, MD  isosorbide mononitrate (IMDUR) 30 MG 24 hr tablet Take 30 mg by mouth daily.    Historical Provider, MD  LORazepam (ATIVAN) 0.5 MG tablet Take 0.5 mg by mouth every 8 (eight) hours as needed for anxiety.    Historical Provider, MD  torsemide (DEMADEX) 20 MG tablet Take 1 tablet (20 mg total) by mouth daily. 04/20/16   Wende Bushy, MD  traMADol (ULTRAM) 50 MG tablet Take 50 mg by mouth every 6 (six) hours as needed. Reported on 04/28/2016    Historical Provider, MD  vitamin B-12 (CYANOCOBALAMIN) 1000 MCG tablet Take 1,000 mcg by mouth daily.    Historical Provider, MD  zolpidem (AMBIEN) 5 MG tablet Take 5 mg by mouth at bedtime as needed for sleep.    Historical Provider, MD    Inpatient Medications:  . albuterol  2.5 mg Nebulization Q4H while awake  . carvedilol  6.25 mg Oral BID WC  . [START ON 07/10/2016] glipiZIDE  10 mg Oral QAC breakfast  . insulin aspart  0-5 Units Subcutaneous QHS  . insulin aspart  0-9 Units Subcutaneous TID WC  . isosorbide mononitrate  30 mg Oral Daily  . torsemide  20 mg Oral  Daily   . dextrose 5 % and 0.45% NaCl 50 mL/hr at 07/09/16 1418    Allergies:  Allergies  Allergen Reactions  . Atorvastatin Other (See Comments)    Severe Muscle Cramping  . Aspirin Other (See Comments)    Bleeding from Peptic Ulcer in 1963.    Social History   Social History  . Marital Status: Married    Spouse Name: N/A  . Number of Children: N/A  . Years of Education: N/A   Occupational History  . Not on file.   Social History Main Topics  . Smoking status: Never Smoker   . Smokeless tobacco: Never Used  . Alcohol Use: No  . Drug Use: No  . Sexual Activity: Not on file   Other Topics Concern  . Not on file   Social History Narrative     Family History  Problem Relation Age of Onset  . Heart disease Mother   . Heart disease  Father     a. First MI @ 73.  . Leukemia Sister   . Diabetes Mellitus II Sister   . Diabetes Mellitus II Brother   . Heart attack Son     a. MI @ 42  . Heart attack Son     a. MI @ 45.     Review of Systems: Review of Systems  Constitutional: Positive for weight loss and malaise/fatigue. Negative for fever, chills and diaphoresis.  HENT: Negative for congestion.   Eyes: Negative for discharge and redness.  Respiratory: Positive for cough, shortness of breath and wheezing. Negative for sputum production.   Cardiovascular: Negative for chest pain, palpitations, orthopnea, claudication, leg swelling and PND.  Gastrointestinal: Negative for heartburn, nausea, vomiting, abdominal pain, blood in stool and melena.  Genitourinary: Negative for hematuria.  Musculoskeletal: Negative for myalgias and falls.  Skin: Negative for rash.  Neurological: Negative for dizziness, tingling, tremors, sensory change, speech change, focal weakness, loss of consciousness and weakness.  Endo/Heme/Allergies: Does not bruise/bleed easily.  Psychiatric/Behavioral: Positive for depression. Negative for substance abuse. The patient is not nervous/anxious.      Labs: No results for input(s): CKTOTAL, CKMB, TROPONINI in the last 72 hours. Lab Results  Component Value Date   WBC 6.9 07/09/2016   HGB 9.8* 07/09/2016   HCT 28.4* 07/09/2016   MCV 90.1 07/09/2016   PLT 242 07/09/2016     Recent Labs Lab 07/09/16 1001  NA 134*  K 4.8  CL 97*  CO2 30  BUN 38*  CREATININE 1.82*  CALCIUM 8.6*  PROT 6.4*  BILITOT 1.3*  ALKPHOS 149*  ALT 13*  AST 20  GLUCOSE 188*   No results found for: CHOL, HDL, LDLCALC, TRIG No results found for: DDIMER  Radiology/Studies:  Dg Chest 1 View  07/07/2016  CLINICAL DATA:  Post large volume right-sided thoracentesis yielding 2.6 L of fluid EXAM: CHEST 1 VIEW COMPARISON:  Chest radiograph - 06/30/2016; 06/23/2016; multiple previous ultrasound-guided thoracentesis. FINDINGS: Grossly unchanged cardiac silhouette and mediastinal contours with atherosclerotic plaque within thoracic aorta. Stable position of support apparatus. Interval reduction of persistent trace right-sided effusion post large volume thoracentesis with development of a small ex vacuo pneumothorax. No mediastinal shift. Improved aeration of the right lower lung with persistent bibasilar opacities, likely atelectasis. No left-sided effusion. No evidence of edema. No acute osseus abnormalities. IMPRESSION: Interval reduction of persistent trace right-sided effusion post large volume thoracentesis with development of a small ex vacuo pneumothorax. The patient was evaluated by the dictating and performing interventional radiologist and remains asymptomatic. As such, the patient was discharged home. The patient was instructed to return to the emergency department if he were to develop worsening shortness of breath. Electronically Signed   By: Sandi Mariscal M.D.   On: 07/07/2016 15:19   Dg Chest 1 View  06/30/2016  CLINICAL DATA:  Post thoracentesis on the right EXAM: CHEST 1 VIEW COMPARISON:  Chest x-ray of 06/23/2016 FINDINGS: After right thoracentesis the  right pleural effusion may have slightly diminished in volume. No pneumothorax is seen. The left lung is clear. Cardiomegaly is stable and a single lead permanent pacemaker remains. IMPRESSION: Slight decrease in volume of the right pleural effusion after right thoracentesis. No pneumothorax. Electronically Signed   By: Ivar Drape M.D.   On: 06/30/2016 15:27   Dg Chest 2 View  07/09/2016  CLINICAL DATA:  Followup pneumothorax. EXAM: CHEST  2 VIEW COMPARISON:  07/07/2016 FINDINGS: The heart is mildly enlarged but stable. Stable tortuosity  and calcification of the thoracic aorta. Very low lung volumes could be due to expiratory chest film. The pneumothorax appears larger but I think it is due to the difference in inspiratory volume compared with the prior study. It is estimated at 15%. There is a persistent small right pleural effusion and overlying atelectasis. IMPRESSION: Persistent 15% right-sided pneumothorax. It appears slightly larger at the apex but is no longer visualized at the right lung base. The difference is likely due to degree of inspiration. Small right effusion and right lower lobe atelectasis. Electronically Signed   By: Marijo Sanes M.D.   On: 07/09/2016 12:31   Dg Chest 2 View  06/23/2016  CLINICAL DATA:  Recurrent right pleural effusion EXAM: CHEST  2 VIEW COMPARISON:  06/16/2016 FINDINGS: Small right pleural effusion and right lower lobe atelectasis/ infiltrate unchanged Small left effusion and mild left lower lobe atelectasis/ infiltrate unchanged. Negative for heart failure.  Single lead pacemaker unchanged. IMPRESSION: No change from the prior study. Bibasilar pleural effusions right greater than left with bibasilar airspace disease right greater than left Electronically Signed   By: Franchot Gallo M.D.   On: 06/23/2016 15:40   Dg Chest 2 View  06/16/2016  CLINICAL DATA:  Status post right-sided thoracentesis. EXAM: CHEST  2 VIEW COMPARISON:  Radiograph of June 09, 2016. FINDINGS:  Stable cardiomediastinal silhouette. Left-sided pacemaker is unchanged in position. Atherosclerosis of thoracic aorta is noted. No pneumothorax is noted. Mild right pleural effusion is noted. Degenerative disc disease is noted involving lower thoracic spine. IMPRESSION: No pneumothorax status post right-sided thoracentesis. Mild right pleural effusion is noted. Electronically Signed   By: Marijo Conception, M.D.   On: 06/16/2016 15:07   Dg Chest 2 View  06/09/2016  CLINICAL DATA:  Status post right thoracentesis EXAM: CHEST  2 VIEW COMPARISON:  06/02/2016 FINDINGS: Cardiac shadow is again enlarged. A pacing device is again seen. Near complete resolution of right-sided pleural effusion is noted with only a minimal amount identified. No focal infiltrate is seen. No pneumothorax is noted. No bony abnormality is seen. IMPRESSION: No pneumothorax following right thoracentesis. Electronically Signed   By: Inez Catalina M.D.   On: 06/09/2016 15:54   US Thoracentesis Asp Pleural Space W/img Guide  07/07/2016  INDICATION: History of chronic congestive heart failure with recurrent symptomatic right-sided pleural effusion. Note, patient has been evaluated at the interventional radiology clinic (on 05/05/2016 by Dr. Anselm Pancoast) regarding placement of a tunneled pleural catheter though still remains undecided. EXAM: US THORACENTESIS ASP PLEURAL SPACE W/IMG GUIDE COMPARISON:  Multiple previous ultrasound-guided paracenteses MEDICATIONS: None. COMPLICATIONS: SIR Level A - No therapy, no consequence. Post thoracentesis chest radiograph demonstrated development of small asymptomatic ex vacuo pneumothorax. TECHNIQUE: Informed written consent was obtained from the patient after a discussion of the risks, benefits and alternatives to treatment. A timeout was performed prior to the initiation of the procedure. Initial ultrasound scanning demonstrates a large pleural effusion. The lower chest was prepped and draped in the usual sterile  fashion. 1% lidocaine was used for local anesthesia. An ultrasound image was saved for documentation purposes. An 8 Fr Safe-T-Centesis catheter was introduced. The thoracentesis was performed. The catheter was removed and a dressing was applied. The patient tolerated the procedure well without immediate post procedural complication. The patient was escorted to have an upright chest radiograph. FINDINGS: A total of approximately 2.6 liters of serous fluid was removed. Requested samples were sent to the laboratory. IMPRESSION: Successful ultrasound-guided right sided thoracentesis yielding 2.6 liters  of pleural fluid. Procedure complicated by development of a postprocedural asymptomatic ex vacuo pneumothorax following this large volume thoracentesis. Electronically Signed   By: Sandi Mariscal M.D.   On: 07/07/2016 15:21   US Thoracentesis Asp Pleural Space W/img Guide  06/30/2016  INDICATION: Right pleural effusion EXAM: ULTRASOUND GUIDED RIGHT THORACENTESIS MEDICATIONS: None. COMPLICATIONS: None immediate. PROCEDURE: An ultrasound guided thoracentesis was thoroughly discussed with the patient and questions answered. The benefits, risks, alternatives and complications were also discussed. The patient understands and wishes to proceed with the procedure. Written consent was obtained. Ultrasound was performed to localize and mark an adequate pocket of fluid in the right chest. The area was then prepped and draped in the normal sterile fashion. 1% Lidocaine was used for local anesthesia. Under ultrasound guidance a Safe-T-Centesis catheter was introduced. Thoracentesis was performed. The catheter was removed and a dressing applied. FINDINGS: A total of approximately 2 L of clear yellow fluid was removed. IMPRESSION: Successful ultrasound guided right thoracentesis yielding 2 L of pleural fluid. Electronically Signed   By: Marybelle Killings M.D.   On: 06/30/2016 15:33   US Thoracentesis Asp Pleural Space W/img  Guide  06/23/2016  INDICATION: Right pleural effusion. EXAM: ULTRASOUND GUIDED right THORACENTESIS MEDICATIONS: None. COMPLICATIONS: None immediate. PROCEDURE: An ultrasound guided thoracentesis was thoroughly discussed with the patient and questions answered. The benefits, risks, alternatives and complications were also discussed. The patient understands and wishes to proceed with the procedure. Written consent was obtained. Ultrasound was performed to localize and mark an adequate pocket of fluid in the right chest. The area was then prepped and draped in the normal sterile fashion. 1% Lidocaine was used for local anesthesia. Under ultrasound guidance a Safe-T-Centesis catheter was introduced. Thoracentesis was performed. The catheter was removed and a dressing applied. FINDINGS: A total of approximately 2.3 L of serous fluid was removed. IMPRESSION: Successful ultrasound guided right thoracentesis yielding 2.3 L of pleural fluid. Electronically Signed   By: Marijo Conception, M.D.   On: 06/23/2016 15:11   US Thoracentesis Asp Pleural Space W/img Guide  06/16/2016  INDICATION: Recurrent right pleural effusion. EXAM: ULTRASOUND GUIDED right THORACENTESIS MEDICATIONS: None. COMPLICATIONS: None immediate. PROCEDURE: An ultrasound guided thoracentesis was thoroughly discussed with the patient and questions answered. The benefits, risks, alternatives and complications were also discussed. The patient understands and wishes to proceed with the procedure. Written consent was obtained. Ultrasound was performed to localize and mark an adequate pocket of fluid in the right chest. The area was then prepped and draped in the normal sterile fashion. 1% Lidocaine was used for local anesthesia. Under ultrasound guidance a Safe-T-Centesis catheter was introduced. Thoracentesis was performed. The catheter was removed and a dressing applied. FINDINGS: A total of approximately 2.4 L of serous fluid was removed. IMPRESSION:  Successful ultrasound guided right thoracentesis yielding 2.4 L of pleural fluid. Electronically Signed   By: Marijo Conception, M.D.   On: 06/16/2016 15:08    EKG: Not done. Needs to be ordered.  Telemetry: Interpreted by me showed: Paced, 61 bpm  Weights: Filed Weights   07/09/16 1300  Weight: 157 lb 12.8 oz (71.578 kg)     Physical Exam: Height 5\' 7"  (1.702 m), weight 157 lb 12.8 oz (71.578 kg), SpO2 94 %. Body mass index is 24.71 kg/(m^2). General: Well developed, well nourished, in no acute distress. Head: Normocephalic, atraumatic, sclera non-icteric, no xanthomas, nares are without discharge.  Neck: Negative for carotid bruits. JVD not elevated. Lungs: Decreased breath sounds. Breathing  is unlabored. Heart: RRR with S1 S2. I/VI systolic murmur, no rubs, or gallops appreciated. Abdomen: Soft, non-tender, non-distended with normoactive bowel sounds. No hepatomegaly. No rebound/guarding. No obvious abdominal masses. Msk:  Strength and tone appear normal for age. Extremities: No clubbing or cyanosis. 1+ edema. Distal pedal pulses are 2+ and equal bilaterally. Neuro: Alert and oriented X 3. No facial asymmetry. No focal deficit. Moves all extremities spontaneously. Psych:  Responds to questions appropriately with a normal affect.    Assessment and Plan:  Principal Problem:   Pneumothorax Active Problems:   Chronic combined systolic and diastolic CHF, NYHA class 3 (HCC)   Chronic atrial fibrillation (HCC)   Sick sinus syndrome (HCC)   Chronic kidney disease (CKD), stage III (moderate)   Hypertensive heart disease   Anemia of chronic disease   Pacemaker   Preoperative cardiovascular examination    1. Pre-operative evaluation: -Recent echo with EF of 35%m which is improved from echo 10/2015 at 25% then -She has not had symptoms concerning for angina -Regardless of stress testing patient would be at high risk for this non-cardiac procedure -Given she is not experiencing  chest pain, and SOB c/w prior episodes that have led to thoracentesis she should move forward with the planned procedure per surgery -No indication for repeat echo at this time given recent one in March  2. Chronic Afib: -Paced currently -Eliquis on hold per surgery -Restart Eliquis when able per surgery -Continue Coreg -CHADSVASc at least 7 (CHF, HTN, age x 2, DM, vascular disease, male)  3. ICM/chronic combined CHF: -Continue Coreg as above and torsemide  -Consider ACEi/ARB/or Entresto given cardiomyopathy if renal function remains stable s/p procedure -Pleurx catheter to be placed as above  4. CAD s/p reported MI: -No prior cardiac caths -No symptoms concerning for angina -On Eliquis in place aspirin   5. SSS: -Status post PPM -Coreg as above -Device appears to be functioning well on tele  6. Hypertensive heart disease: -Continue current medications  7. CKD stage III: -Per IM  8. Anemia of chronic disease: -Iron studies pending -Currently stable for resumption of Eliquis s/p procedure -Will need to monitor lab post procedure to ensure level is ok with resumption    Signed, Christell Faith, PA-C [place] Pager: [phone] 07/09/2016, 3:48 PM

## 2016-07-09 NOTE — Progress Notes (Signed)
[facility] Post-Op Note  Patient ID: Donald Henry, male   DOB: [birth year], 82 y.o.   MRN: [id number]  HISTORY: This patient is an 82 year old gentleman who underwent a thoracentesis earlier this week. I had seen him to discuss the possibility of a Pleurx catheter. He underwent his thoracentesis of 2.6 L of fluid. The patient states that he did feel better afterwards. He does not complain of any specific problems today. Specifically he does not complain of any significant shortness of breath. His oxygen saturations were 94% on room air. He and his son have discussed the possibility of the Pleurx catheter and would like to have that performed.   Filed Vitals:   07/09/16 1026  BP: 111/65  Pulse: 65  Temp: 98.3 F (36.8 C)     EXAM: Resp: Lungs are clear On the left but diminished throughout the right hemithorax..  No respiratory distress, normal effort. Heart:  Regular without murmurs Abd:  Abdomen is soft, non distended and non tender. No masses are palpable.  There is no rebound and no guarding.  Neurological: Alert and oriented to person, place, and time. Coordination normal.  Skin: Skin is warm and dry. No rash noted. No diaphoretic. No erythema. No pallor.  Psychiatric: Normal mood and affect. Normal behavior. Judgment and thought content normal.    ASSESSMENT: I have independently reviewed the chest x-ray after his thoracentesis. There was a pneumothorax present.   PLAN:   We will obtain a repeat x-ray now. We will set the patient up for insertion of this Pleurx catheter for next week. He will stop his Eliquis 2 days prior.    [name], MD

## 2016-07-10 ENCOUNTER — Inpatient Hospital Stay: Payer: Medicare Other

## 2016-07-10 LAB — BASIC METABOLIC PANEL
ANION GAP: 5 (ref 5–15)
BUN: 30 mg/dL — AB (ref 6–20)
CO2: 32 mmol/L (ref 22–32)
Calcium: 8.3 mg/dL — ABNORMAL LOW (ref 8.9–10.3)
Chloride: 99 mmol/L — ABNORMAL LOW (ref 101–111)
Creatinine, Ser: 1.54 mg/dL — ABNORMAL HIGH (ref 0.61–1.24)
GFR calc Af Amer: 46 mL/min — ABNORMAL LOW (ref >60)
GFR, EST NON AFRICAN AMERICAN: 40 mL/min — AB (ref >60)
GLUCOSE: 103 mg/dL — AB (ref 65–99)
POTASSIUM: 4.7 mmol/L (ref 3.5–5.1)
Sodium: 136 mmol/L (ref 135–145)

## 2016-07-10 LAB — CBC
HEMATOCRIT: 27.4 % — AB (ref 40.0–52.0)
HEMOGLOBIN: 9.7 g/dL — AB (ref 13.0–18.0)
MCH: 31.2 pg (ref 26.0–34.0)
MCHC: 35.3 g/dL (ref 32.0–36.0)
MCV: 88.4 fL (ref 80.0–100.0)
Platelets: 246 10*3/uL (ref 150–440)
RBC: 3.1 MIL/uL — ABNORMAL LOW (ref 4.40–5.90)
RDW: 17.6 % — AB (ref 11.5–14.5)
WBC: 6.9 10*3/uL (ref 3.8–10.6)

## 2016-07-10 NOTE — Progress Notes (Signed)
Subjective:   He has no complaints this morning. He says his breathing is essentially normal. I have reviewed the internal medicine cardiology evaluations both of which suggest he is low to moderate risk for surgical intervention. We still do not have a definitive etiology on his recurrent effusion. Chest x-ray today demonstrates a stable right pneumothorax and a small increase in the amount of pleural fluid on the right side.  Vital signs in last 24 hours: Temp:  [97.7 F (36.5 C)-98.3 F (36.8 C)] 98 F (36.7 C) (07/15 0455) Pulse Rate:  [61-68] 68 (07/15 0455) Resp:  [15-20] 15 (07/15 0455) BP: (95-130)/(43-65) 106/51 mmHg (07/15 0455) SpO2:  [93 %-99 %] 93 % (07/15 0455) Weight:  [71.578 kg (157 lb 12.8 oz)-73.483 kg (162 lb)] 71.578 kg (157 lb 12.8 oz) (07/14 1300) Last BM Date: 07/09/16  Intake/Output from previous day: 07/14 0701 - 07/15 0700 In: 240 [P.O.:240] Out: 1725 [Urine:1725]  Exam:  His exam is unchanged. He does not have any significant shortness of breath. He is not having any increased work of breathing. His breath sounds are stable.  Lab Results:  CBC  Recent Labs  07/09/16 1001 07/10/16 0545  WBC 6.9 6.9  HGB 9.8* 9.7*  HCT 28.4* 27.4*  PLT 242 246   CMP     Component Value Date/Time   NA 136 07/10/2016 0545   NA 140 04/19/2016 0758   NA 140 04/19/2014 0540   K 4.7 07/10/2016 0545   K 3.9 04/19/2014 0540   CL 99* 07/10/2016 0545   CL 103 04/19/2014 0540   CO2 32 07/10/2016 0545   CO2 29 04/19/2014 0540   GLUCOSE 103* 07/10/2016 0545   GLUCOSE 161* 04/19/2016 0758   GLUCOSE 113* 04/19/2014 0540   BUN 30* 07/10/2016 0545   BUN 68* 04/19/2016 0758   BUN 32* 04/19/2014 0540   CREATININE 1.54* 07/10/2016 0545   CREATININE 1.58* 04/19/2014 0540   CALCIUM 8.3* 07/10/2016 0545   CALCIUM 8.9 04/19/2014 0540   PROT 6.4* 07/09/2016 1001   PROT 6.7 04/16/2014 1029   ALBUMIN 3.1* 07/09/2016 1001   ALBUMIN 3.6 04/16/2014 1029   AST 20 07/09/2016  1001   AST 26 04/16/2014 1029   ALT 13* 07/09/2016 1001   ALT 49 04/16/2014 1029   ALKPHOS 149* 07/09/2016 1001   ALKPHOS 64 04/16/2014 1029   BILITOT 1.3* 07/09/2016 1001   BILITOT 1.0 04/16/2014 1029   GFRNONAA 40* 07/10/2016 0545   GFRNONAA 40* 04/19/2014 0540   GFRAA 46* 07/10/2016 0545   GFRAA 47* 04/19/2014 0540   PT/INR  Recent Labs  07/09/16 1001  LABPROT 19.6*  INR 1.66    Studies/Results: Dg Chest 2 View  07/09/2016  CLINICAL DATA:  Followup pneumothorax. EXAM: CHEST  2 VIEW COMPARISON:  07/07/2016 FINDINGS: The heart is mildly enlarged but stable. Stable tortuosity and calcification of the thoracic aorta. Very low lung volumes could be due to expiratory chest film. The pneumothorax appears larger but I think it is due to the difference in inspiratory volume compared with the prior study. It is estimated at 15%. There is a persistent small right pleural effusion and overlying atelectasis. IMPRESSION: Persistent 15% right-sided pneumothorax. It appears slightly larger at the apex but is no longer visualized at the right lung base. The difference is likely due to degree of inspiration. Small right effusion and right lower lobe atelectasis. Electronically Signed   By: Marijo Sanes M.D.   On: 07/09/2016 12:31    Assessment/Plan:  He remains off his Eliquis. We will continue to plan for surgery Monday morning. I will repeat his chest x-ray tomorrow unless he has further symptoms later today. He is in agreement.

## 2016-07-10 NOTE — Progress Notes (Signed)
Pt admitted for recurrent pleural effusions. Central Tele reported 7 beat run of Vtach at 11. Pt was stable at time, denied pain and problems breathing. Pt began to report chest pain and shortness of breath at 4pm. RN placed pt on 4 liters of [place] and positioned pt in chair. Pt currently denies chest pain and problems breathing. Dr. Bridgett Larsson is aware. (RN paged and notified by phone.)

## 2016-07-10 NOTE — Progress Notes (Signed)
[place] [facility] [place] NAME: Donald Henry    MR#:  [id number]  DATE OF BIRTH:  [birth year]  SUBJECTIVE:  CHIEF COMPLAINT:  No chief complaint on file.  No complaint. REVIEW OF SYSTEMS:  Review of Systems  Constitutional: Negative for fever, chills and malaise/fatigue.  Respiratory: Negative for cough, hemoptysis, sputum production, shortness of breath and wheezing.   Cardiovascular: Negative for chest pain, palpitations, orthopnea and leg swelling.  Gastrointestinal: Negative for nausea, vomiting, abdominal pain and diarrhea.  Genitourinary: Negative for dysuria, urgency and frequency.  Musculoskeletal: Negative for joint pain.  Neurological: Negative for dizziness, focal weakness, weakness and headaches.  Psychiatric/Behavioral: Negative for depression. The patient is not nervous/anxious.     DRUG ALLERGIES:   Allergies  Allergen Reactions  . Atorvastatin Other (See Comments)    Severe Muscle Cramping  . Aspirin Other (See Comments)    Bleeding from Peptic Ulcer in 1963.   VITALS:  Blood pressure 114/56, pulse 59, temperature 98.6 F (37 C), temperature source Oral, resp. rate 18, height 5\' 7"  (1.702 m), weight 157 lb 12.8 oz (71.578 kg), SpO2 99 %. PHYSICAL EXAMINATION:  Physical Exam  Constitutional: He is oriented to person, place, and time and well-developed, well-nourished, and in no distress. No distress.  HENT:  Head: Normocephalic and atraumatic.  Nose: Nose normal.  Mouth/Throat: Oropharynx is clear and moist.  Eyes: Conjunctivae and EOM are normal. Pupils are equal, round, and reactive to light.  Neck: No JVD present. No thyromegaly present.  Cardiovascular: Normal rate, regular rhythm and intact distal pulses.  Exam reveals no gallop and no friction rub.   No murmur heard. Pulmonary/Chest: Effort normal and breath sounds normal. No respiratory distress. He has no wheezes. He has no rales.  Diminished lung sound  on right side.  Musculoskeletal: He exhibits no edema or tenderness.  Neurological: He is alert and oriented to person, place, and time. No cranial nerve deficit.  Skin: No rash noted. No erythema.  Psychiatric: Mood, affect and judgment normal.   LABORATORY PANEL:   CBC  Recent Labs Lab 07/10/16 0545  WBC 6.9  HGB 9.7*  HCT 27.4*  PLT 246   ------------------------------------------------------------------------------------------------------------------ Chemistries   Recent Labs Lab 07/09/16 1001 07/10/16 0545  NA 134* 136  K 4.8 4.7  CL 97* 99*  CO2 30 32  GLUCOSE 188* 103*  BUN 38* 30*  CREATININE 1.82* 1.54*  CALCIUM 8.6* 8.3*  AST 20  --   ALT 13*  --   ALKPHOS 149*  --   BILITOT 1.3*  --    RADIOLOGY:  No results found. ASSESSMENT AND PLAN:   1. Chronic systolic congestive heart failure. Stable. Continue low-dose torsemide and Coreg. We'll consider ACE inhibitor after procedure since chronic kidney disease seems stable.  2. History of coronary artery disease. In April 2017 had a high risk stress test. Cardiac catheter not done secondary to chronic kidney disease. Medical management.  3. Chronic kidney disease stage III. This is stable from previous creatinines.  4. 15% pneumothorax after thoracentesis.  Per Dr. Meliton Rattan, hold eliquis, repeat chest x-ray tomorrow unless he has further symptoms later today.   5. Recurrent right pleural effusion. Eliquis on hold for Pleurx catheter on Monday 6. Type 2 diabetes mellitus, hold glipizide. Continue sliding scale. 7. Anxiety on Ativan 8.  Chronic anemia. Send off iron studies. Likely anemia of chronic disease. Stable. 10. History of pulmonary embolism and history of atrial fibrillation. Higher risk  of stroke off eliquis.    All the records are reviewed and case discussed with Care Management/Social Worker. Management plans discussed with the patient, family and they are in agreement.  CODE STATUS: DNR  TOTAL  TIME TAKING CARE OF THIS PATIENT: 33 minutes.   More than 50% of the time was spent in counseling/coordination of care: YES  POSSIBLE D/C IN >3 DAYS, DEPENDING ON CLINICAL CONDITION.   Demetrios Loll M.D on 07/10/2016 at 1:18 PM  Between 7am to 6pm - Pager - [phone]  After 6pm go to [email or web address] - Proofreader  [facility]  Office  [phone]  CC: Primary care physician; Madelyn Brunner, MD  Note: This dictation was prepared with Dragon dictation along with smaller phrase technology. Any transcriptional errors that result from this process are unintentional.

## 2016-07-11 ENCOUNTER — Inpatient Hospital Stay: Payer: Medicare Other

## 2016-07-11 DIAGNOSIS — I5042 Chronic combined systolic (congestive) and diastolic (congestive) heart failure: Secondary | ICD-10-CM

## 2016-07-11 DIAGNOSIS — I482 Chronic atrial fibrillation: Secondary | ICD-10-CM

## 2016-07-11 NOTE — Progress Notes (Signed)
Subjective:   He feels essentially the same other than some slight increase in work of breathing. He feels that he is developing some extra fluid in his long. Chest x-ray today shows essentially the same pneumothorax with increasing pleural effusion. He has no other new symptoms.  Vital signs in last 24 hours: Temp:  [97.8 F (36.6 C)-98.7 F (37.1 C)] 97.8 F (36.6 C) (07/16 0603) Pulse Rate:  [59-67] 67 (07/16 0603) Resp:  [18] 18 (07/16 0603) BP: (114-156)/(52-77) 128/72 mmHg (07/16 0603) SpO2:  [91 %-100 %] 91 % (07/16 0603) Last BM Date: 07/09/16  Intake/Output from previous day: 07/15 0701 - 07/16 0700 In: 2040 [P.O.:480; I.V.:1560] Out: 1650 [Urine:1650]  Exam:  There is no change in his lung exam. He has equal breath sounds bilaterally with some dullness in the right base. Cardiac exam present murmurs or gallops.  Lab Results:  CBC  Recent Labs  07/09/16 1001 07/10/16 0545  WBC 6.9 6.9  HGB 9.8* 9.7*  HCT 28.4* 27.4*  PLT 242 246   CMP     Component Value Date/Time   NA 136 07/10/2016 0545   NA 140 04/19/2016 0758   NA 140 04/19/2014 0540   K 4.7 07/10/2016 0545   K 3.9 04/19/2014 0540   CL 99* 07/10/2016 0545   CL 103 04/19/2014 0540   CO2 32 07/10/2016 0545   CO2 29 04/19/2014 0540   GLUCOSE 103* 07/10/2016 0545   GLUCOSE 161* 04/19/2016 0758   GLUCOSE 113* 04/19/2014 0540   BUN 30* 07/10/2016 0545   BUN 68* 04/19/2016 0758   BUN 32* 04/19/2014 0540   CREATININE 1.54* 07/10/2016 0545   CREATININE 1.58* 04/19/2014 0540   CALCIUM 8.3* 07/10/2016 0545   CALCIUM 8.9 04/19/2014 0540   PROT 6.4* 07/09/2016 1001   PROT 6.7 04/16/2014 1029   ALBUMIN 3.1* 07/09/2016 1001   ALBUMIN 3.6 04/16/2014 1029   AST 20 07/09/2016 1001   AST 26 04/16/2014 1029   ALT 13* 07/09/2016 1001   ALT 49 04/16/2014 1029   ALKPHOS 149* 07/09/2016 1001   ALKPHOS 64 04/16/2014 1029   BILITOT 1.3* 07/09/2016 1001   BILITOT 1.0 04/16/2014 1029   GFRNONAA 40* 07/10/2016 0545    GFRNONAA 40* 04/19/2014 0540   GFRAA 46* 07/10/2016 0545   GFRAA 47* 04/19/2014 0540   PT/INR  Recent Labs  07/09/16 1001  LABPROT 19.6*  INR 1.66    Studies/Results: Dg Chest 1 View  07/11/2016  CLINICAL DATA:  Followup pneumothorax. EXAM: CHEST 1 VIEW COMPARISON:  07/10/2016 FINDINGS: There is a left chest wall pacer device with lead in the right ventricle. Pneumothorax overlying the right upper lobe measures 2.1 cm in thickness. This is compared with 1.9 cm on previous exam. Allowing for differences in technique and patient positioning this is not significantly changed. Right pleural effusion and right basilar atelectasis is again noted. IMPRESSION: 1. No significant change in small to moderate right upper lobe pneumothorax. 2. Right pleural effusion 3. These results will be called to the ordering clinician or representative by the Radiologist Assistant, and communication documented in the PACS or zVision Dashboard. Electronically Signed   By: Kerby Moors M.D.   On: 07/11/2016 08:20   Dg Chest 2 View  07/10/2016  CLINICAL DATA:  Follow-up right pneumonia EXAM: CHEST  2 VIEW COMPARISON:  07/09/2016 and prior exams FINDINGS: Cardiomegaly and left-sided pacemaker again noted. A small- moderate right pneumothorax has slightly increased. Right pleural fluid and right lower lung atelectasis/ airspace  disease again noted. No other significant changes identified. IMPRESSION: Slightly increasing small-moderate right pneumothorax. Right pleural fluid and right lower lung atelectasis/ airspace disease again noted. Cardiomegaly. Electronically Signed   By: Margarette Canada M.D.   On: 07/10/2016 15:03   Dg Chest 2 View  07/09/2016  CLINICAL DATA:  Followup pneumothorax. EXAM: CHEST  2 VIEW COMPARISON:  07/07/2016 FINDINGS: The heart is mildly enlarged but stable. Stable tortuosity and calcification of the thoracic aorta. Very low lung volumes could be due to expiratory chest film. The pneumothorax  appears larger but I think it is due to the difference in inspiratory volume compared with the prior study. It is estimated at 15%. There is a persistent small right pleural effusion and overlying atelectasis. IMPRESSION: Persistent 15% right-sided pneumothorax. It appears slightly larger at the apex but is no longer visualized at the right lung base. The difference is likely due to degree of inspiration. Small right effusion and right lower lobe atelectasis. Electronically Signed   By: Marijo Sanes M.D.   On: 07/09/2016 12:31    Assessment/Plan: Overall is clinically unchanged. Have some V. tach on telemetry last night but did not have any clinical symptoms. We are set up to perform a Pleurx catheter insertion tomorrow as outlined to him in detail. He is in agreement

## 2016-07-11 NOTE — Progress Notes (Signed)
SUBJECTIVE:  He feels about the same. Mild shortness of breath with no chest pain.   Filed Vitals:   07/10/16 1123 07/10/16 1933 07/10/16 1937 07/11/16 0603  BP: 114/56 116/77 156/52 128/72  Pulse: 59 59 59 67  Temp: 98.6 F (37 C) 98.7 F (37.1 C) 98.4 F (36.9 C) 97.8 F (36.6 C)  TempSrc: Oral Oral Oral   Resp: 18 18 18 18   Height:      Weight:      SpO2: 99% 95% 100% 91%    Intake/Output Summary (Last 24 hours) at 07/11/16 1308 Last data filed at 07/11/16 1112  Gross per 24 hour  Intake   1030 ml  Output   1500 ml  Net   -470 ml    LABS: Basic Metabolic Panel:  Recent Labs  07/09/16 1001 07/10/16 0545  NA 134* 136  K 4.8 4.7  CL 97* 99*  CO2 30 32  GLUCOSE 188* 103*  BUN 38* 30*  CREATININE 1.82* 1.54*  CALCIUM 8.6* 8.3*   Liver Function Tests:  Recent Labs  07/09/16 1001  AST 20  ALT 13*  ALKPHOS 149*  BILITOT 1.3*  PROT 6.4*  ALBUMIN 3.1*   No results for input(s): LIPASE, AMYLASE in the last 72 hours. CBC:  Recent Labs  07/09/16 1001 07/10/16 0545  WBC 6.9 6.9  NEUTROABS 5.5  --   HGB 9.8* 9.7*  HCT 28.4* 27.4*  MCV 90.1 88.4  PLT 242 246   Cardiac Enzymes: No results for input(s): CKTOTAL, CKMB, CKMBINDEX, TROPONINI in the last 72 hours. BNP: Invalid input(s): POCBNP D-Dimer: No results for input(s): DDIMER in the last 72 hours. Hemoglobin A1C: No results for input(s): HGBA1C in the last 72 hours. Fasting Lipid Panel: No results for input(s): CHOL, HDL, LDLCALC, TRIG, CHOLHDL, LDLDIRECT in the last 72 hours. Thyroid Function Tests: No results for input(s): TSH, T4TOTAL, T3FREE, THYROIDAB in the last 72 hours.  Invalid input(s): FREET3 Anemia Panel: No results for input(s): VITAMINB12, FOLATE, FERRITIN, TIBC, IRON, RETICCTPCT in the last 72 hours.   PHYSICAL EXAM General: Well developed, well nourished, in no acute distress HEENT:  Normocephalic and atramatic Neck:  No JVD.  Lungs: Diminished breath sounds at the   right lung base Heart: HRRR . Normal S1 and S2 without gallops or murmurs.  Abdomen: Bowel sounds are positive, abdomen soft and non-tender  Msk:  Back normal, normal gait. Normal strength and tone for age. Extremities: No clubbing, cyanosis or edema.   Neuro: Alert and oriented X 3. Psych:  Good affect, responds appropriately  TELEMETRY: Reviewed telemetry pt in ventricular paced rhythm. 5 beat run of ventricular tachycardia.  ASSESSMENT AND PLAN:  1. Preoperative cardiovascular evaluation for Pleurx catheter placement. Moderate risk from a cardiac standpoint due chronic systolic heart failure and other chronic medical conditions. The patient appears to be optimized at the present time.  2. Chronic systolic heart failure: Most recent EF was 35%. Recurrent right pleural effusion of unclear etiology but could be due to heart failure. Fluid analysis should be performed to make sure he does not have an exudate. Continue torsemide 20 mg once daily. Continue carvedilol. He is not on an ACE inhibitor or ARB due to chronic kidney disease.   3. Chronic atrial fibrillation: Underlying ventricular paced rhythm. Resume anticoagulation after surgery.  4. Coronary artery disease: No angina. Continue medical therapy. He had a 5 beat run of ventricular tachycardia last night but overall he was asymptomatic.  5. Status  post permanent pacemaker placement.  Donald Sacramento, MD, [facility] 07/11/2016 1:08 PM

## 2016-07-11 NOTE — Progress Notes (Addendum)
[place] at [place] NAME: Lorren Commander    MR#:  [id number]  DATE OF BIRTH:  Nov 30, 1930  SUBJECTIVE:  CHIEF COMPLAINT:  No chief complaint on file.  No complaint. REVIEW OF SYSTEMS:  Review of Systems  Constitutional: Negative for fever, chills and malaise/fatigue.  Respiratory: Negative for cough, hemoptysis, sputum production, shortness of breath and wheezing.   Cardiovascular: Negative for chest pain, palpitations, orthopnea and leg swelling.  Gastrointestinal: Negative for nausea, vomiting, abdominal pain and diarrhea.  Genitourinary: Negative for dysuria, urgency and frequency.  Musculoskeletal: Negative for joint pain.  Neurological: Negative for dizziness, focal weakness, weakness and headaches.  Psychiatric/Behavioral: Negative for depression. The patient is not nervous/anxious.     DRUG ALLERGIES:   Allergies  Allergen Reactions  . Atorvastatin Other (See Comments)    Severe Muscle Cramping  . Aspirin Other (See Comments)    Bleeding from Peptic Ulcer in 1963.   VITALS:  Blood pressure 128/72, pulse 67, temperature 97.8 F (36.6 C), temperature source Oral, resp. rate 18, height 5\' 7"  (1.702 m), weight 157 lb 12.8 oz (71.578 kg), SpO2 91 %. PHYSICAL EXAMINATION:  Physical Exam  Constitutional: He is oriented to person, place, and time and well-developed, well-nourished, and in no distress. No distress.  HENT:  Head: Normocephalic and atraumatic.  Nose: Nose normal.  Mouth/Throat: Oropharynx is clear and moist.  Eyes: Conjunctivae and EOM are normal. Pupils are equal, round, and reactive to light.  Neck: No JVD present. No thyromegaly present.  Cardiovascular: Normal rate, regular rhythm and intact distal pulses.  Exam reveals no gallop and no friction rub.   No murmur heard. Pulmonary/Chest: Effort normal and breath sounds normal. No respiratory distress. He has no wheezes. He has no rales.  Diminished lung sound  on right side.  Musculoskeletal: He exhibits no edema or tenderness.  Neurological: He is alert and oriented to person, place, and time. No cranial nerve deficit.  Skin: No rash noted. No erythema.  Psychiatric: Mood, affect and judgment normal.   LABORATORY PANEL:   CBC  Recent Labs Lab 07/10/16 0545  WBC 6.9  HGB 9.7*  HCT 27.4*  PLT 246   ------------------------------------------------------------------------------------------------------------------ Chemistries   Recent Labs Lab 07/09/16 1001 07/10/16 0545  NA 134* 136  K 4.8 4.7  CL 97* 99*  CO2 30 32  GLUCOSE 188* 103*  BUN 38* 30*  CREATININE 1.82* 1.54*  CALCIUM 8.6* 8.3*  AST 20  --   ALT 13*  --   ALKPHOS 149*  --   BILITOT 1.3*  --    RADIOLOGY:  Dg Chest 1 View  07/11/2016  CLINICAL DATA:  Followup pneumothorax. EXAM: CHEST 1 VIEW COMPARISON:  07/10/2016 FINDINGS: There is a left chest wall pacer device with lead in the right ventricle. Pneumothorax overlying the right upper lobe measures 2.1 cm in thickness. This is compared with 1.9 cm on previous exam. Allowing for differences in technique and patient positioning this is not significantly changed. Right pleural effusion and right basilar atelectasis is again noted. IMPRESSION: 1. No significant change in small to moderate right upper lobe pneumothorax. 2. Right pleural effusion 3. These results will be called to the ordering clinician or representative by the Radiologist Assistant, and communication documented in the PACS or zVision Dashboard. Electronically Signed   By: Kerby Moors M.D.   On: 07/11/2016 08:20   ASSESSMENT AND PLAN:   1. Chronic systolic congestive heart failure EF 35%. Stable. Continue  low-dose torsemide and Coreg. We'll consider ACE inhibitor after procedure since chronic kidney disease seems stable.  2. History of coronary artery disease. In April 2017 had a high risk stress test. Cardiac catheter not done secondary to chronic  kidney disease. Medical management.  3. Chronic kidney disease stage III. This is stable from previous creatinines.  4. 15% pneumothorax after thoracentesis.  Per Dr. Meliton Rattan, hold eliquis, repeat chest x-ray: 1. No significant change in small to moderate right upper lobe pneumothorax. 2. Right pleural effusion.   5. Recurrent right pleural effusion.  Eliquis on hold for Pleurx catheter on Monday. Per Dr. Fletcher Anon,  Moderate risk from a cardiac standpoint due chronic systolic heart failure and other chronic medical conditions. The patient appears to be optimized at the present time.  6. Type 2 diabetes mellitus, hold glipizide. Continue sliding scale. 7. Anxiety on Ativan 8.  Chronic anemia. Send off iron studies. Likely anemia of chronic disease. Stable. 10. History of pulmonary embolism and history of atrial fibrillation. Higher risk of stroke off eliquis.  Resume anticoagulation after surgery.  I discussed with Dr. Pat Patrick. All the records are reviewed and case discussed with Care Management/Social Worker. Management plans discussed with the patient, family and they are in agreement.  CODE STATUS: DNR  TOTAL TIME TAKING CARE OF THIS PATIENT: 25 minutes.   More than 50% of the time was spent in counseling/coordination of care: YES  POSSIBLE D/C IN >3 DAYS, DEPENDING ON CLINICAL CONDITION.   Demetrios Loll M.D on 07/11/2016 at 1:18 PM  Between 7am to 6pm - Pager - [phone]  After 6pm go to [email or web address] - Proofreader  [facility]  Office  [phone]  CC: Primary care physician; Madelyn Brunner, MD  Note: This dictation was prepared with Dragon dictation along with smaller phrase technology. Any transcriptional errors that result from this process are unintentional.

## 2016-07-12 ENCOUNTER — Inpatient Hospital Stay: Admission: RE | Admit: 2016-07-12 | Payer: Medicare Other | Source: Ambulatory Visit

## 2016-07-12 ENCOUNTER — Ambulatory Visit: Admission: RE | Admit: 2016-07-12 | Payer: Medicare Other | Source: Ambulatory Visit | Admitting: Cardiothoracic Surgery

## 2016-07-12 ENCOUNTER — Encounter
Admission: AD | Disposition: A | Discharge status: Hospice | Payer: Self-pay | Source: Ambulatory Visit | Attending: Cardiothoracic Surgery

## 2016-07-12 ENCOUNTER — Inpatient Hospital Stay: Payer: Medicare Other

## 2016-07-12 ENCOUNTER — Inpatient Hospital Stay: Payer: Medicare Other | Admitting: Certified Registered Nurse Anesthetist

## 2016-07-12 ENCOUNTER — Encounter: Payer: Self-pay | Admitting: Cardiothoracic Surgery

## 2016-07-12 DIAGNOSIS — J90 Pleural effusion, not elsewhere classified: Secondary | ICD-10-CM | POA: Insufficient documentation

## 2016-07-12 DIAGNOSIS — J939 Pneumothorax, unspecified: Principal | ICD-10-CM

## 2016-07-12 HISTORY — PX: CHEST TUBE INSERTION: SHX231

## 2016-07-12 LAB — BODY FLUID CELL COUNT WITH DIFFERENTIAL
EOS FL: 22 %
LYMPHS FL: 30 %
Monocyte-Macrophage-Serous Fluid: 24 %
NEUTROPHIL FLUID: 23 %
OTHER CELLS FL: 1 %
Total Nucleated Cell Count, Fluid: 862 cu mm

## 2016-07-12 LAB — PROTEIN, BODY FLUID

## 2016-07-12 LAB — GLUCOSE, CAPILLARY
GLUCOSE-CAPILLARY: 148 mg/dL — AB (ref 65–99)
GLUCOSE-CAPILLARY: 151 mg/dL — AB (ref 65–99)
GLUCOSE-CAPILLARY: 148 mg/dL — AB (ref 65–99)
GLUCOSE-CAPILLARY: 173 mg/dL — AB (ref 65–99)

## 2016-07-12 LAB — GLUCOSE, SEROUS FLUID: Glucose, Fluid: 122 mg/dL

## 2016-07-12 LAB — LACTATE DEHYDROGENASE, PLEURAL OR PERITONEAL FLUID: LD, Fluid: 231 U/L — ABNORMAL HIGH (ref 3–23)

## 2016-07-12 SURGERY — INSERTION, PLEURAL DRAINAGE CATHETER
Anesthesia: General | Wound class: Clean Contaminated

## 2016-07-12 MED ORDER — APIXABAN 2.5 MG PO TABS
2.5000 mg | ORAL_TABLET | Freq: Two times a day (BID) | ORAL | Status: DC
  Administered 2016-07-12 – 2016-07-13 (×3): 2.5 mg via ORAL
  Filled 2016-07-12 (×3): qty 1

## 2016-07-12 MED ORDER — CEFAZOLIN SODIUM 1 G IJ SOLR
INTRAMUSCULAR | Status: DC | PRN
  Administered 2016-07-12: 1 g via INTRAMUSCULAR

## 2016-07-12 MED ORDER — OXYCODONE HCL 5 MG PO TABS
5.0000 mg | ORAL_TABLET | ORAL | Status: DC | PRN
  Administered 2016-07-12: 10 mg via ORAL
  Administered 2016-07-12: 5 mg via ORAL
  Administered 2016-07-13: 10 mg via ORAL
  Filled 2016-07-12: qty 2
  Filled 2016-07-12: qty 10
  Filled 2016-07-12: qty 1
  Filled 2016-07-12: qty 2

## 2016-07-12 MED ORDER — CARVEDILOL 12.5 MG PO TABS: 6.2500 mg | ORAL_TABLET | Freq: Two times a day (BID) | ORAL | Status: DC

## 2016-07-12 MED ORDER — BISACODYL 5 MG PO TBEC
10.0000 mg | DELAYED_RELEASE_TABLET | Freq: Every day | ORAL | Status: DC
  Administered 2016-07-12: 10 mg via ORAL
  Filled 2016-07-12: qty 2

## 2016-07-12 MED ORDER — LORAZEPAM 0.5 MG PO TABS: 0.5000 mg | ORAL_TABLET | Freq: Three times a day (TID) | ORAL | Status: DC | PRN

## 2016-07-12 MED ORDER — ALBUTEROL SULFATE (2.5 MG/3ML) 0.083% IN NEBU
2.5000 mg | INHALATION_SOLUTION | Freq: Four times a day (QID) | RESPIRATORY_TRACT | Status: DC
  Administered 2016-07-12 – 2016-07-13 (×4): 2.5 mg via RESPIRATORY_TRACT
  Filled 2016-07-12 (×5): qty 3

## 2016-07-12 MED ORDER — FENTANYL CITRATE (PF) 100 MCG/2ML IJ SOLN
INTRAMUSCULAR | Status: DC | PRN
  Administered 2016-07-12: 25 ug via INTRAVENOUS

## 2016-07-12 MED ORDER — LIDOCAINE HCL (PF) 1 % IJ SOLN
INTRAMUSCULAR | Status: AC
  Filled 2016-07-12: qty 30

## 2016-07-12 MED ORDER — FENTANYL CITRATE (PF) 100 MCG/2ML IJ SOLN: 25.0000 ug | INTRAMUSCULAR | Status: DC | PRN

## 2016-07-12 MED ORDER — PHENYLEPHRINE HCL 10 MG/ML IJ SOLN
INTRAMUSCULAR | Status: DC | PRN
  Administered 2016-07-12 (×2): 100 ug via INTRAVENOUS

## 2016-07-12 MED ORDER — SODIUM CHLORIDE 0.9 % IV SOLN
INTRAVENOUS | Status: DC | PRN
  Administered 2016-07-12: 07:00:00 via INTRAVENOUS

## 2016-07-12 MED ORDER — TORSEMIDE 20 MG PO TABS: 20.0000 mg | ORAL_TABLET | Freq: Every day | ORAL | Status: DC

## 2016-07-12 MED ORDER — ALBUTEROL SULFATE (2.5 MG/3ML) 0.083% IN NEBU: 2.5000 mg | INHALATION_SOLUTION | RESPIRATORY_TRACT | Status: DC

## 2016-07-12 MED ORDER — ONDANSETRON HCL 4 MG/2ML IJ SOLN: 4.0000 mg | Freq: Four times a day (QID) | INTRAMUSCULAR | Status: DC | PRN

## 2016-07-12 MED ORDER — ISOSORBIDE MONONITRATE ER 30 MG PO TB24: 30.0000 mg | ORAL_TABLET | Freq: Every day | ORAL | Status: DC

## 2016-07-12 MED ORDER — LIDOCAINE HCL 1 % IJ SOLN
INTRAMUSCULAR | Status: DC | PRN
  Administered 2016-07-12: 16 mL

## 2016-07-12 MED ORDER — CEFAZOLIN IN D5W 1 GM/50ML IV SOLN
1.0000 g | Freq: Three times a day (TID) | INTRAVENOUS | Status: AC
  Administered 2016-07-12 – 2016-07-13 (×3): 1 g via INTRAVENOUS
  Filled 2016-07-12 (×3): qty 50

## 2016-07-12 MED ORDER — PROPOFOL 500 MG/50ML IV EMUL
INTRAVENOUS | Status: DC | PRN
  Administered 2016-07-12: 55 ug/kg/min via INTRAVENOUS

## 2016-07-12 MED ORDER — DEXTROSE-NACL 5-0.45 % IV SOLN: INTRAVENOUS | Status: DC

## 2016-07-12 MED ORDER — MIDAZOLAM HCL 2 MG/2ML IJ SOLN
INTRAMUSCULAR | Status: DC | PRN
  Administered 2016-07-12 (×2): 0.5 mg via INTRAVENOUS

## 2016-07-12 MED ORDER — LIDOCAINE HCL (CARDIAC) 20 MG/ML IV SOLN
INTRAVENOUS | Status: DC | PRN
  Administered 2016-07-12: 30 mg via INTRAVENOUS

## 2016-07-12 MED ORDER — ONDANSETRON HCL 4 MG/2ML IJ SOLN: 4.0000 mg | Freq: Once | INTRAMUSCULAR | Status: DC | PRN

## 2016-07-12 SURGICAL SUPPLY — 33 items
CANISTER SUCT 1200ML W/VALVE (MISCELLANEOUS) ×2 IMPLANT
CHLORAPREP W/TINT 26ML (MISCELLANEOUS) ×2 IMPLANT
DRAIN CHEST DRY SUCT SGL (MISCELLANEOUS) ×2 IMPLANT
DRAPE INCISE IOBAN 66X45 STRL (DRAPES) ×2 IMPLANT
DRAPE LAPAROTOMY 77X122 PED (DRAPES) ×2 IMPLANT
ELECT REM PT RETURN 9FT ADLT (ELECTROSURGICAL) ×2
ELECTRODE REM PT RTRN 9FT ADLT (ELECTROSURGICAL) ×1 IMPLANT
GLOVE SURG SYN 7.5  E (GLOVE) ×1
GLOVE SURG SYN 7.5 E (GLOVE) ×1 IMPLANT
GOWN STRL REUS W/ TWL LRG LVL3 (GOWN DISPOSABLE) ×2 IMPLANT
GOWN STRL REUS W/TWL LRG LVL3 (GOWN DISPOSABLE) ×2
KIT PLEURX DRAIN CATH 500ML (KITS) ×2 IMPLANT
KIT RM TURNOVER STRD PROC AR (KITS) ×2 IMPLANT
LABEL OR SOLS (LABEL) ×2 IMPLANT
MARKER SKIN DUAL TIP RULER LAB (MISCELLANEOUS) ×2 IMPLANT
NEEDLE FILTER BLUNT 18X 1/2SAF (NEEDLE) ×1
NEEDLE FILTER BLUNT 18X1 1/2 (NEEDLE) ×1 IMPLANT
PACK BASIN MINOR ARMC (MISCELLANEOUS) ×2 IMPLANT
SUCTION FRAZIER HANDLE 10FR (MISCELLANEOUS) ×1
SUCTION TUBE FRAZIER 10FR DISP (MISCELLANEOUS) ×1 IMPLANT
SUT ETH BLK MONO 3 0 FS 1 12/B (SUTURE) ×4 IMPLANT
SUT ETHILON 4-0 (SUTURE) ×1
SUT ETHILON 4-0 FS2 18XMFL BLK (SUTURE) ×1
SUT SILK 0 (SUTURE) ×1
SUT SILK 0 30XBRD TIE 6 (SUTURE) ×1 IMPLANT
SUT SILK 1 SH (SUTURE) ×2 IMPLANT
SUT VIC AB 0 SH 27 (SUTURE) ×2 IMPLANT
SUT VIC AB 2-0 SH 27 (SUTURE) ×1
SUT VIC AB 2-0 SH 27XBRD (SUTURE) ×1 IMPLANT
SUT VIC AB 3-0 SH 27 (SUTURE) ×1
SUT VIC AB 3-0 SH 27X BRD (SUTURE) ×1 IMPLANT
SUTURE ETHLN 4-0 FS2 18XMF BLK (SUTURE) ×1 IMPLANT
WATER STERILE IRR 1000ML POUR (IV SOLUTION) ×2 IMPLANT

## 2016-07-12 NOTE — Progress Notes (Signed)
    SUBJECTIVE:   The patient underwent Pleurx catheter placement without complications. So far 1700 mL of fluid came out. He reports improvement in shortness of breath.   Filed Vitals:   07/12/16 1122 07/12/16 1402 07/12/16 1544 07/12/16 1602  BP: 122/59 123/57    Pulse: 61 60    Temp: 97.8 F (36.6 C)     TempSrc: Oral     Resp: 18     Height:      Weight:      SpO2: 100% 98% 97% 97%    Intake/Output Summary (Last 24 hours) at 07/12/16 1716 Last data filed at 07/12/16 1611  Gross per 24 hour  Intake    965 ml  Output   2750 ml  Net  -1785 ml    LABS: Basic Metabolic Panel:  Recent Labs  07/10/16 0545  NA 136  K 4.7  CL 99*  CO2 32  GLUCOSE 103*  BUN 30*  CREATININE 1.54*  CALCIUM 8.3*   Liver Function Tests: No results for input(s): AST, ALT, ALKPHOS, BILITOT, PROT, ALBUMIN in the last 72 hours. No results for input(s): LIPASE, AMYLASE in the last 72 hours. CBC:  Recent Labs  07/10/16 0545  WBC 6.9  HGB 9.7*  HCT 27.4*  MCV 88.4  PLT 246   Cardiac Enzymes: No results for input(s): CKTOTAL, CKMB, CKMBINDEX, TROPONINI in the last 72 hours. BNP: Invalid input(s): POCBNP D-Dimer: No results for input(s): DDIMER in the last 72 hours. Hemoglobin A1C: No results for input(s): HGBA1C in the last 72 hours. Fasting Lipid Panel: No results for input(s): CHOL, HDL, LDLCALC, TRIG, CHOLHDL, LDLDIRECT in the last 72 hours. Thyroid Function Tests: No results for input(s): TSH, T4TOTAL, T3FREE, THYROIDAB in the last 72 hours.  Invalid input(s): FREET3 Anemia Panel: No results for input(s): VITAMINB12, FOLATE, FERRITIN, TIBC, IRON, RETICCTPCT in the last 72 hours.   PHYSICAL EXAM General: Well developed, well nourished, in no acute distress HEENT:  Normocephalic and atramatic Neck:  No JVD.  Lungs: Clear bilaterally. Heart: HRRR . Normal S1 and S2 without gallops or murmurs.  Abdomen: Bowel sounds are positive, abdomen soft and non-tender  Msk:  Back  normal, normal gait. Normal strength and tone for age. Extremities: No clubbing, cyanosis or edema.   Neuro: Alert and oriented X 3. Psych:  Good affect, responds appropriately  TELEMETRY: Reviewed telemetry pt in ventricular paced rhythm.   ASSESSMENT AND PLAN:   1. Chronic systolic heart failure: Most recent EF was 35%. Recurrent right pleural effusion of unclear etiology but could be due to heart failure. Fluid analysis is pending. Continue torsemide 20 mg once daily. Continue carvedilol. He is not on an ACE inhibitor or ARB due to chronic kidney disease.   2. Chronic atrial fibrillation: Underlying ventricular paced rhythm. Anticoagulation was resumed without any bleeding complications  3. Coronary artery disease: No angina. Continue medical therapy.   4. Status post permanent pacemaker placement. Ventricular paced rhythm on telemetry. His pacemaker seems to be functioning normally.  Donald Sacramento, MD, [facility] 07/12/2016 5:16 PM

## 2016-07-12 NOTE — Anesthesia Preprocedure Evaluation (Signed)
Anesthesia Evaluation  Patient identified by MRN, date of birth, ID band Patient awake    Reviewed: Allergy & Precautions, NPO status , Patient's Chart, lab work & pertinent test results  History of Anesthesia Complications Negative for: history of anesthetic complications  Airway Mallampati: III       Dental   Pulmonary shortness of breath and at rest,           Cardiovascular hypertension, Pt. on medications + CAD, + Past MI and +CHF  + pacemaker      Neuro/Psych negative neurological ROS     GI/Hepatic Neg liver ROS, hiatal hernia, PUD,   Endo/Other  diabetes, Oral Hypoglycemic Agents  Renal/GU Renal InsufficiencyRenal disease     Musculoskeletal   Abdominal   Peds  Hematology  (+) anemia ,   Anesthesia Other Findings   Reproductive/Obstetrics                             Anesthesia Physical Anesthesia Plan  ASA: III  Anesthesia Plan: General   Post-op Pain Management:    Induction: Intravenous  Airway Management Planned:   Additional Equipment:   Intra-op Plan:   Post-operative Plan:   Informed Consent: I have reviewed the patients History and Physical, chart, labs and discussed the procedure including the risks, benefits and alternatives for the proposed anesthesia with the patient or authorized representative who has indicated his/her understanding and acceptance.     Plan Discussed with:   Anesthesia Plan Comments:         Anesthesia Quick Evaluation

## 2016-07-12 NOTE — Care Management (Signed)
Met with patient and son at bedside. They want [facility] [place]. Referral was made as an out patient. Santiago Glad with hospice aware. Patient to discharge with plerex drain and hosp[facility] of [place].

## 2016-07-12 NOTE — Progress Notes (Signed)
Returned from procedure with chest tube in place.  Drainage amt noted at 1300  Will continue to monitor the site.  Dressing is dry and intact

## 2016-07-12 NOTE — Transfer of Care (Signed)
Immediate Anesthesia Transfer of Care Note  Patient: Donald Henry  Procedure(s) Performed: Procedure(s): INSERTION PLEURAL DRAINAGE CATHETER (N/A)  Patient Location: PACU  Anesthesia Type:General  Level of Consciousness: awake, alert  and oriented  Airway & Oxygen Therapy: Patient Spontanous Breathing and Patient connected to face mask oxygen  Post-op Assessment: Report given to RN and Post -op Vital signs reviewed and stable  Post vital signs: Reviewed and stable  Last Vitals:  Filed Vitals:   07/11/16 1929 07/12/16 0605  BP: 129/71 134/72  Pulse: 66 64  Temp: 36.8 C 36.7 C  Resp: 18 18    Last Pain:  Filed Vitals:   07/12/16 0611  PainSc: 0-No pain         Complications: No apparent anesthesia complications

## 2016-07-12 NOTE — Interval H&P Note (Signed)
History and Physical Interval Note:  07/12/2016 7:14 AM  Donald Henry  has presented today for surgery, with the diagnosis of RIGHT RECURRENT PLEURAL EFFUSION  The various methods of treatment have been discussed with the patient and family. After consideration of risks, benefits and other options for treatment, the patient has consented to  Procedure(s): INSERTION PLEURAL DRAINAGE CATHETER (N/A) as a surgical intervention .  The patient's history has been reviewed, patient examined, no change in status, stable for surgery.  I have reviewed the patient's chart and labs.  Questions were answered to the patient's satisfaction.     [name]

## 2016-07-12 NOTE — Progress Notes (Signed)
Follow up visit made to new referral for [facility] services at home following discharge. Writer met in the patient's room with Mr. Hazen and his son Cecilie Lowers and niece Malachy Mood to initiate education regarding hospice services, philosophy and team approach to care with good understanding voiced.  Plan is for patient to return home tomorrow. He is currently on oxygen with saturations of 98-100%, of note he does not use oxygen at home. Dr. Genevive Bi in during visit, chest tube changed from suction to gravity, plan to d/c tomorrow with pleurex catheter to remain. Discharge orders will need to include the amount and frequency of draining. Also patient will need supplies sent home at discharge. Patient was able to walk around the nurse's station with Dr. Genevive Bi, using a cane and remained up in the chair after walking. Oxygen to be taken off with trial of checking saturations. It does not appear that patient will need oxygen at discharge. No other DME needs at this time. Updated patient information faxed to referral. Will continue to follow through final disposition. Thank you. Flo Shanks RN, BSN, [place] [facility] [place], [facility] [phone] c

## 2016-07-12 NOTE — Progress Notes (Signed)
Pt alert and oriented x4, no complaints of pain or discomfort.  Bed in low position, call bell within reach.  Bed alarms on and functioning.  Assessment done and charted.  Will continue to monitor and do hourly rounding throughout the shift 

## 2016-07-12 NOTE — Op Note (Signed)
  07/09/2016 - 07/12/2016  8:59 AM  PATIENT:  Donald Henry  82 y.o. male  PRE-OPERATIVE DIAGNOSIS:  Right-sided recurrent pleural effusion  POST-OPERATIVE DIAGNOSIS:  Same  PROCEDURE:  Insertion of right Pleurx catheter  SURGEON:  Surgeon(s) and Role:    * [name], MD - Primary  ASSISTANTS: None  ANESTHESIA: Local with intravenous sedation  INDICATIONS FOR PROCEDURE this is an 82 year old gentleman who is undergone multiple right sided thoracentesis for recurrent pleural effusions. Etiology of his pleural effusion is felt to be congestive heart failure. He recently was admitted to the hospital after a thoracentesis resulted in an iatrogenic pneumothorax. He was apprised of the indications and risks of the procedure and he gave his informed consent.  DICTATION: The patient was brought to the operative suite and placed in the supine position. Appropriate monitoring lines were positioned. The patient was enrolled with his right chest up. His arm was placed in an arm holder and a beanbag was placed around the patient to keep them in position. The patient was then prepped and draped in usual sterile fashion. 16 cc of 1% lidocaine were used as a local anesthetic. A skin wheal was raised in the posterior axillary line and appropriate spot was selected posterior to this for placement of our catheter. The underlying muscle and rib were anesthetized. A small skin incision was made in both locations and the catheter was tunneled from anterior to posterior. In the posterior wound using the Seldinger technique the catheter was inserted into the right pleural space. The catheter threaded easily without problems. It was immediately hooked to the suction bottle and 950 cc of clear yellowish fluid were removed. This was sent for appropriate analysis. The catheter was then hooked to a pleurevac and an additional 350 cc were removed. There was a small air leak present.  The catheter was secured to the  anterior incision with a #1 silk suture. The posterior incision was closed with interrupted Vicryls. Nylon was used on the skin to approximate the small skin incision. Sterile dressings were then applied. The patient was rolled in the supine position where he was then taken to the recovery room in stable condition.   [name], MD

## 2016-07-12 NOTE — Clinical Social Work Note (Signed)
Clinical Social Work Assessment  Patient Details  Name: Donald Henry MRN: [id number] Date of Birth: 1930-04-03  Date of referral:  07/12/16               Reason for consult:  Discharge Planning                Permission sought to share information with:  Family Supports Permission granted to share information::  Yes, Verbal Permission Granted  Name::        Agency::     Relationship::    Cecilie Lowers- Son)  Contact Information:     Housing/Transportation Living arrangements for the past 2 months:  Single Family Home Source of Information:  Patient, Adult Children Patient Interpreter Needed:  None Criminal Activity/Legal Involvement Pertinent to Current Situation/Hospitalization:  No - Comment as needed Significant Relationships:  Adult Children, Other Family Members, Friend Lives with:  Self Do you feel safe going back to the place where you live?  Yes Need for family participation in patient care:  Yes (Comment) Cecilie Lowers- Son)  Care giving concerns:  Patient's family reports they'd like extra assistance with patient.    Social Worker assessment / plan:  CSW met at bedside with patient and his family. Introduced herself and her role. Was granted verbal permission to discuss discharge plans. Per patient he'd like to go home. His son Cecilie Lowers is in agreement. Requested extra assistance at home to help with patients needs such as [facility] services. Stated that they aren't interested in patient going to a ALF or SNF. Reported that patient's family is willing to assist with patient if he returns home. Informed CSW that patient is open with [facility]. Granted CSW verbal permission to contact [facility]. Reported they are interested in patient returning home. CSW made RNCM aware. [name] and informed her that patient is open with their services. She's following patient. CSW is signing off but is available if a CSW need were to arise.    Employment status:  Retired Forensic scientist:  Medicare PT Recommendations:  Not assessed at this time [place] / Referral to community resources:  [place]  Patient/Family's Response to care:  Patient's family would like patient to discharge home with [facility] services or [employer].   Patient/Family's Understanding of and Emotional Response to Diagnosis, Current Treatment, and Prognosis:  Patient and his family reports they understand his diagnosis, current treatment and prognosis. Reported they are appreciative of CSW assistance.   Emotional Assessment Appearance:  Appears stated age Attitude/Demeanor/Rapport:   (None) Affect (typically observed):  Calm, Pleasant, Accepting Orientation:  Oriented to Self, Oriented to Place, Oriented to  Time, Oriented to Situation Alcohol / Substance use:  Not Applicable Psych involvement (Current and /or in the community):  No (Comment)  Discharge Needs  Concerns to be addressed:  Discharge Planning Concerns Readmission within the last 30 days:  No Current discharge risk:  Chronically ill Barriers to Discharge:  Continued Medical Work up   [employer], LCSW 07/12/2016, 2:42 PM

## 2016-07-12 NOTE — Anesthesia Postprocedure Evaluation (Signed)
Anesthesia Post Note  Patient: Donald Henry  Procedure(s) Performed: Procedure(s) (LRB): INSERTION PLEURAL DRAINAGE CATHETER (N/A)  Patient location during evaluation: PACU Anesthesia Type: General Level of consciousness: awake and alert Pain management: pain level controlled Vital Signs Assessment: post-procedure vital signs reviewed and stable Respiratory status: spontaneous breathing and respiratory function stable Cardiovascular status: blood pressure returned to baseline and stable Anesthetic complications: no    Last Vitals:  Filed Vitals:   07/12/16 0605 07/12/16 0830  BP: 134/72 127/66  Pulse: 64 60  Temp: 36.7 C 36.4 C  Resp: 18 17    Last Pain:  Filed Vitals:   07/12/16 0832  PainSc: 0-No pain                 Kendrew Paci K

## 2016-07-12 NOTE — Progress Notes (Signed)
Consent signed for Donald Henry procedure. OR called and report given. Patient made aware pick-up time will be at 0700.

## 2016-07-12 NOTE — H&P (View-Only) (Signed)
Patient ID: Donald Henry, male   DOB: [birth year], 82 y.o.   MRN: [id number]  No chief complaint on file.   HPI Donald Henry is a 82 y.o. male.  I saw Donald Henry earlier in the week and discussed with him the possibility of placing a Pleurx catheter on the right side for recurrent pleural effusions. He underwent a scheduled thoracentesis the following day on Wednesday and 2.6 L of fluid was withdrawn however the post chest x-ray did show a small pneumothorax. The patient follow-up with me today to continue his discussion about Pleurx catheter management and he states that he felt quite well without any shortness of breath and in fact felt significantly better than prior to his thoracentesis. However on physical exam I found that there were diminished breath sounds on the right and a repeat chest x-ray showed a moderate sized right-sided pleural effusion and pneumothorax. Even though the patient was essentially asymptomatic I felt that it was important to bring him into the hospital for further management including management of his anticoagulation, cardiac and kidney issues.   Past Medical History  Diagnosis Date  . Diabetes mellitus without complication ([place])   . SSS (sick sinus syndrome) (HCC)     a. s/p MDT ADSRO1 Single Lead PPM.  . Myocardial infarction [facility])     a. MI @ age 58-->conservatively managed.  Has never had a cath.  Reports multiple negative stress tests.  . Ischemic cardiomyopathy   . Chronic combined systolic (congestive) and diastolic (congestive) heart failure ([place])     a. 01/2015 Echo: EF 40%, mod MR/PR/TR;  b. 10/2015 Echo: EF 20-25%, sev MR, sev dil LA/RA, sev TR.  Marland Kitchen Hypertensive heart disease   . Pulmonary embolism ([place])     a. 10/2015 ->Eliquis.  . Normocytic anemia   . Peptic ulcer disease   . Obesity   . CKD (chronic kidney disease), stage III   . BPH (benign prostatic hyperplasia)   . Recurrent right pleural effusion     a. 01/28/2016 1.7L fluid  removal via thoracentesis; b. 02/19/2016 2.4L removal via thoracentesis.  . Severe mitral regurgitation     a. 10/2015 Echo: sev MR.  . Hiatal hernia     Past Surgical History  Procedure Laterality Date  . Insert / replace / remove pacemaker  04/19/2014    [employer]onic  Serial # [id number]    Family History  Problem Relation Age of Onset  . Heart disease Mother   . Heart disease Father     a. First MI @ 46.  . Leukemia Sister   . Diabetes Mellitus II Sister   . Diabetes Mellitus II Brother   . Heart attack Son     a. MI @ 38  . Heart attack Son     a. MI @ 101.    Social History Social History  Substance Use Topics  . Smoking status: Never Smoker   . Smokeless tobacco: Never Used  . Alcohol Use: No    Allergies  Allergen Reactions  . Atorvastatin Other (See Comments)    Severe Muscle Cramping  . Aspirin Other (See Comments)    Bleeding from Peptic Ulcer in 1963.    Current Facility-Administered Medications  Medication Dose Route Frequency Provider Last Rate Last Dose  . albuterol (PROVENTIL) (2.5 MG/3ML) 0.083% nebulizer solution 2.5 mg  2.5 mg Nebulization Q4H while awake [name], MD      . carvedilol (COREG) tablet 6.25 mg  6.25 mg Oral  BID WC [name], MD      . dextrose 5 %-0.45 % sodium chloride infusion   Intravenous Continuous [name], MD      . Derrill Memo ON 07/10/2016] glipiZIDE (GLUCOTROL) tablet 10 mg  10 mg Oral QAC breakfast [name], MD      . insulin aspart (novoLOG) injection 0-5 Units  0-5 Units Subcutaneous QHS Loletha Grayer, MD      . insulin aspart (novoLOG) injection 0-9 Units  0-9 Units Subcutaneous TID WC Loletha Grayer, MD      . isosorbide mononitrate (IMDUR) 24 hr tablet 30 mg  30 mg Oral Daily [name], MD      . LORazepam (ATIVAN) tablet 0.5 mg  0.5 mg Oral Q8H PRN [name], MD      . ondansetron [facility]) injection 4 mg  4 mg Intravenous Q6H PRN [name], MD      . torsemide (DEMADEX) tablet 20 mg  20  mg Oral Daily [name], MD        Location, Quality, Duration, Severity, Timing, Context, Modifying Factors, Associated Signs and Symptoms.  Review of Systems A complete review of systems was asked and was negative except for the following positive findingsShortness of breath prior to his thoracentesis. He denied any shortness of breath at Donald time.  Height 5\' 7"  (1.702 m), weight 157 lb 12.8 oz (71.578 kg).  Physical Exam CONSTITUTIONAL:  Pleasant, well-developed, well-nourished, and in no acute distress. EYES: Pupils equal and reactive to light, Sclera non-icteric EARS, NOSE, MOUTH AND THROAT:  The oropharynx was clear.  Oral mucosa pink and moist. LYMPH NODES:  Lymph nodes in the neck and axillae were normal RESPIRATORY:  Lungs were clear on the left but diminished on the right..  Normal respiratory effort without pathologic use of accessory muscles of respiration CARDIOVASCULAR: Heart was regular with systolic murmurs.  There were no carotid bruits. GI: The abdomen was soft, nontender, and nondistended. There were no palpable masses. There was no hepatosplenomegaly. There were normal bowel sounds in all quadrants. MUSCULOSKELETAL:  Normal muscle strength and tone.  No clubbing or cyanosis.   SKIN:  There were no pathologic skin lesions.  There were no nodules on palpation. NEUROLOGIC:  Sensation is normal.  Cranial nerves are grossly intact. PSYCH:  Oriented to person, place and time.  Mood and affect are normal.  Data Reviewed Multiple chest x-rays  I have personally reviewed the patient's imaging and medical records.    Assessment    I have independently reviewed the chest x-ray from today. The pneumothorax has increased. Despite the fact that he is asymptomatic I believe it be best to bring him into the hospital for observation and workup of his recurrent pleural effusions.    Plan    I had a chance to talk to the patient and his son again today. I reviewed with them  the indications and risks as well as the management of insertion of a Pleurx catheter. We will bring the patient into the hospital today. We will have our hospitalist evaluate the patient for his multiple comorbid conditions. We will hold his anticoagulation. We will perform his surgery on Monday morning. In addition I have discussed his care with our hospitalist who will come by and see the patient later today.     [name], MD 07/09/2016, 2:16 PM

## 2016-07-12 NOTE — Progress Notes (Signed)
Pt. Slept well throughout the night with no complaints.

## 2016-07-12 NOTE — Progress Notes (Signed)
Drainage noted at 1700.  Dressing dry and intact

## 2016-07-12 NOTE — Progress Notes (Signed)
Drainage noted at 1500  Vital signs stable and Dr Bridgett Larsson made aware of the amt and vital signs

## 2016-07-12 NOTE — Care Management Important Message (Signed)
Important Message  Patient Details  Name: Donald Henry MRN: [id number] Date of Birth: 1930-05-03   Medicare Important Message Given:  Yes    Jolly Mango, RN 07/12/2016, 12:32 PM

## 2016-07-12 NOTE — Progress Notes (Signed)
New referral for [facility] services at home following discharge received from [facility]. Writer spoke briefly with patient's son Cecilie Lowers, he was unavailable for discussion of services d/t other immediate family needs he was called to. Writer gave [place] her contact number, he plans to return to the hospital later today. Writer also spoke with Mr. Trojanowski in his room, niece and nephew present, plan remains to return home with hospice services. Of note patient is currently on oxygen at 2 liters and was not requiring oxygen prior to this admission. CMRN Lattie Haw updated and is aware that Probation officer will meet later with Cecilie Lowers. Mr. Litaker was admitted from his surgeon's office on 7/14 for treatment of a pneumothorax following a right sided thoracentesis for recurrent pleural effusions. Patient had a pleurex drain placed this morning. Per chart note review this drained 950 cc and is currently attached to a pleurevac. Pleural fluid has been sent for analysis. Will continue to follow through final disposition.  Flo Shanks RN, BSN, [address] and [facility] [place], [facility] [phone] c

## 2016-07-12 NOTE — Anesthesia Procedure Notes (Signed)
Date/Time: 07/12/2016 7:40 AM Performed by: Johnna Acosta Pre-anesthesia Checklist: Patient identified, Emergency Drugs available, Suction available, Patient being monitored and Timeout performed Patient Re-evaluated:Patient Re-evaluated prior to inductionOxygen Delivery Method: Simple face mask

## 2016-07-12 NOTE — Progress Notes (Signed)
[facility] [place] NAME: Donald Henry    MR#:  [id number]  DATE OF BIRTH:  24-Jan-1930  SUBJECTIVE:  CHIEF COMPLAINT:  No chief complaint on file.  No complaint. S/p Insertion of right Pleurx catheter this am, 1700 ml yellow drainage so far. REVIEW OF SYSTEMS:  Review of Systems  Constitutional: Negative for fever, chills and malaise/fatigue.  Respiratory: Negative for cough, hemoptysis, sputum production, shortness of breath and wheezing.   Cardiovascular: Negative for chest pain, palpitations, orthopnea and leg swelling.  Gastrointestinal: Negative for nausea, vomiting, abdominal pain and diarrhea.  Genitourinary: Negative for dysuria, urgency and frequency.  Musculoskeletal: Negative for joint pain.  Neurological: Negative for dizziness, focal weakness, weakness and headaches.  Psychiatric/Behavioral: Negative for depression. The patient is not nervous/anxious.     DRUG ALLERGIES:   Allergies  Allergen Reactions  . Atorvastatin Other (See Comments)    Severe Muscle Cramping  . Aspirin Other (See Comments)    Bleeding from Peptic Ulcer in 1963.   VITALS:  Blood pressure 123/57, pulse 60, temperature 97.8 F (36.6 C), temperature source Oral, resp. rate 18, height 5\' 7"  (1.702 m), weight 149 lb 14.4 oz (67.994 kg), SpO2 97 %. PHYSICAL EXAMINATION:  Physical Exam  Constitutional: He is oriented to person, place, and time and well-developed, well-nourished, and in no distress. No distress.  HENT:  Head: Normocephalic and atraumatic.  Nose: Nose normal.  Mouth/Throat: Oropharynx is clear and moist.  Eyes: Conjunctivae and EOM are normal. Pupils are equal, round, and reactive to light.  Neck: No JVD present. No thyromegaly present.  Cardiovascular: Normal rate, regular rhythm and intact distal pulses.  Exam reveals no gallop and no friction rub.   No murmur heard. Pulmonary/Chest: Effort normal and breath sounds normal. No  respiratory distress. He has no wheezes. He has no rales.  Diminished lung sound on right side.  Musculoskeletal: He exhibits no edema or tenderness.  Neurological: He is alert and oriented to person, place, and time. No cranial nerve deficit.  Skin: No rash noted. No erythema.  Psychiatric: Mood, affect and judgment normal.   LABORATORY PANEL:   CBC  Recent Labs Lab 07/10/16 0545  WBC 6.9  HGB 9.7*  HCT 27.4*  PLT 246   ------------------------------------------------------------------------------------------------------------------ Chemistries   Recent Labs Lab 07/09/16 1001 07/10/16 0545  NA 134* 136  K 4.8 4.7  CL 97* 99*  CO2 30 32  GLUCOSE 188* 103*  BUN 38* 30*  CREATININE 1.82* 1.54*  CALCIUM 8.6* 8.3*  AST 20  --   ALT 13*  --   ALKPHOS 149*  --   BILITOT 1.3*  --    RADIOLOGY:  Dg Chest Port 1 View  07/12/2016  CLINICAL DATA:  Status post placement of all pleural drainage catheter today with development of a pneumothorax. EXAM: PORTABLE CHEST 1 VIEW COMPARISON:  Single-view of the chest earlier today and 07/11/2016. FINDINGS: Pleural drainage catheter is again seen and appears to have been pulled back although the tip is difficult to localize. Large right pneumothorax seen on the study earlier today has resolved. The left lung is clear. There is a small amount of pleural fluid on the right. Heart size is enlarged. IMPRESSION: Resolved right pneumothorax with a pleural drainage catheter in place. Small right pleural effusion is noted. Electronically Signed   By: Inge Rise M.D.   On: 07/12/2016 14:35   Dg Chest Port 1 View  07/12/2016  CLINICAL DATA:  Status post Pleurx catheter placement. EXAM: PORTABLE CHEST 1 VIEW COMPARISON:  Radiograph July 11, 2016. FINDINGS: Stable cardiomediastinal silhouette. Left-sided pacemaker is unchanged in position. Left lung is clear. Large right pneumothorax is noted which is significantly increased in size compared to prior  exam. There is also been interval placement of pleural drainage catheter with distal tip in the right upper lobe. Bony thorax is unremarkable. IMPRESSION: Status post placement of pleural drainage catheter on right side. Large right pneumothorax is now noted which is significantly in increased in size compared to prior exam. Critical Value/emergent results were called by telephone at the time of interpretation on 07/12/2016 at 9:03 am to Loren Racer, the patient's nurse, who verbally acknowledged these results and will immediately contact Dr. Genevive Bi. Electronically Signed   By: Marijo Conception, M.D.   On: 07/12/2016 09:03   ASSESSMENT AND PLAN:   1. Chronic systolic congestive heart failure EF 35%. Stable. Continue low-dose torsemide and Coreg. Add ACE inhibitor after procedure since chronic kidney disease seems stable.  2. History of coronary artery disease. In April 2017 had a high risk stress test. Cardiac catheter not done secondary to chronic kidney disease. Medical management.  3. Chronic kidney disease stage III. This is stable from previous creatinines.  4. 15% pneumothorax after thoracentesis.  S/p Insertion of right Pleurx catheter this am, 1700 ml yellow drainage so far.  CXR after procedure: Resolved right pneumothorax with a pleural drainage catheter in place. Small right pleural effusion is noted.  5. Recurrent right pleural effusion. S/p Insertion of right Pleurx catheter this am, 1700 ml yellow drainage so far. Eliquis on hold for Pleurx catheter.  Resume eliquis.  6. Type 2 diabetes mellitus, hold glipizide. Continue sliding scale. 7. Anxiety on Ativan 8.  Chronic anemia. Send off iron studies. Likely anemia of chronic disease. Stable. 10. History of pulmonary embolism and history of atrial fibrillation. Higher risk of stroke off eliquis.  Resume anticoagulation after surgery.  I discussed with hospice staff Santiago Glad. All the records are reviewed and case discussed with Care  Management/Social Worker. Management plans discussed with the patient, family and they are in agreement.  CODE STATUS: DNR  TOTAL TIME TAKING CARE OF THIS PATIENT: 33 minutes.   More than 50% of the time was spent in counseling/coordination of care: YES  POSSIBLE D/C to home with hospice care  IN 2 DAYS, DEPENDING ON CLINICAL CONDITION.   Demetrios Loll M.D on 07/12/2016 at 4:32 PM  Between 7am to 6pm - Pager - [phone]  After 6pm go to [email or web address] - Proofreader  [facility]  Office  [phone]  CC: Primary care physician; Madelyn Brunner, MD  Note: This dictation was prepared with Dragon dictation along with smaller phrase technology. Any transcriptional errors that result from this process are unintentional.

## 2016-07-13 ENCOUNTER — Telehealth: Payer: Self-pay

## 2016-07-13 ENCOUNTER — Inpatient Hospital Stay: Payer: Medicare Other

## 2016-07-13 LAB — CBC
HEMATOCRIT: 31.1 % — AB (ref 40.0–52.0)
Hemoglobin: 11 g/dL — ABNORMAL LOW (ref 13.0–18.0)
MCH: 30.8 pg (ref 26.0–34.0)
MCHC: 35.4 g/dL (ref 32.0–36.0)
MCV: 87.1 fL (ref 80.0–100.0)
Platelets: 263 10*3/uL (ref 150–440)
RBC: 3.57 MIL/uL — AB (ref 4.40–5.90)
RDW: 18.3 % — AB (ref 11.5–14.5)
WBC: 8.2 10*3/uL (ref 3.8–10.6)

## 2016-07-13 LAB — BASIC METABOLIC PANEL
ANION GAP: 6 (ref 5–15)
BUN: 30 mg/dL — AB (ref 6–20)
CHLORIDE: 100 mmol/L — AB (ref 101–111)
CO2: 30 mmol/L (ref 22–32)
Calcium: 8.5 mg/dL — ABNORMAL LOW (ref 8.9–10.3)
Creatinine, Ser: 1.46 mg/dL — ABNORMAL HIGH (ref 0.61–1.24)
GFR, EST AFRICAN AMERICAN: 49 mL/min — AB (ref >60)
GFR, EST NON AFRICAN AMERICAN: 42 mL/min — AB (ref >60)
Glucose, Bld: 120 mg/dL — ABNORMAL HIGH (ref 65–99)
Potassium: 3.9 mmol/L (ref 3.5–5.1)
SODIUM: 136 mmol/L (ref 135–145)

## 2016-07-13 LAB — GLUCOSE, CAPILLARY
GLUCOSE-CAPILLARY: 164 mg/dL — AB (ref 65–99)
Glucose-Capillary: 117 mg/dL — ABNORMAL HIGH (ref 65–99)

## 2016-07-13 LAB — MISC LABCORP TEST (SEND OUT): LABCORP TEST CODE: 19588

## 2016-07-13 NOTE — Progress Notes (Signed)
Chest tube transitioned over to Pleurax catheter system.  Insertion site clean and dry without drainage noted.  New dressing applied.

## 2016-07-13 NOTE — Progress Notes (Signed)
Back from xray

## 2016-07-13 NOTE — Progress Notes (Signed)
[facility] [place] NAME: Donald Henry    MR#:  [id number]  DATE OF BIRTH:  1930/10/26  SUBJECTIVE:  CHIEF COMPLAINT:  No chief complaint on file.  No complaint. S/p Insertion of right Pleurx catheter, drainage 200 ml today. REVIEW OF SYSTEMS:  Review of Systems  Constitutional: Negative for fever, chills and malaise/fatigue.  Respiratory: Negative for cough, hemoptysis, sputum production, shortness of breath and wheezing.   Cardiovascular: Negative for chest pain, palpitations, orthopnea and leg swelling.  Gastrointestinal: Negative for nausea, vomiting, abdominal pain and diarrhea.  Genitourinary: Negative for dysuria, urgency and frequency.  Musculoskeletal: Negative for joint pain.  Neurological: Negative for dizziness, focal weakness, weakness and headaches.  Psychiatric/Behavioral: Negative for depression. The patient is not nervous/anxious.     DRUG ALLERGIES:   Allergies  Allergen Reactions  . Atorvastatin Other (See Comments)    Severe Muscle Cramping  . Aspirin Other (See Comments)    Bleeding from Peptic Ulcer in 1963.   VITALS:  Blood pressure 104/54, pulse 59, temperature 98.1 F (36.7 C), temperature source Oral, resp. rate 18, height 5\' 7"  (1.702 m), weight 149 lb 14.4 oz (67.994 kg), SpO2 96 %. PHYSICAL EXAMINATION:  Physical Exam  Constitutional: He is oriented to person, place, and time and well-developed, well-nourished, and in no distress. No distress.  HENT:  Head: Normocephalic and atraumatic.  Nose: Nose normal.  Mouth/Throat: Oropharynx is clear and moist.  Eyes: Conjunctivae and EOM are normal. Pupils are equal, round, and reactive to light.  Neck: No JVD present. No thyromegaly present.  Cardiovascular: Normal rate, regular rhythm and intact distal pulses.  Exam reveals no gallop and no friction rub.   No murmur heard. Pulmonary/Chest: Effort normal and breath sounds normal. No respiratory  distress. He has no wheezes. He has no rales.  Better diminished lung sound on right side.  Musculoskeletal: He exhibits no edema or tenderness.  Neurological: He is alert and oriented to person, place, and time. No cranial nerve deficit.  Skin: No rash noted. No erythema.  Psychiatric: Mood, affect and judgment normal.   LABORATORY PANEL:   CBC  Recent Labs Lab 07/13/16 0450  WBC 8.2  HGB 11.0*  HCT 31.1*  PLT 263   ------------------------------------------------------------------------------------------------------------------ Chemistries   Recent Labs Lab 07/09/16 1001  07/13/16 0450  NA 134*  < > 136  K 4.8  < > 3.9  CL 97*  < > 100*  CO2 30  < > 30  GLUCOSE 188*  < > 120*  BUN 38*  < > 30*  CREATININE 1.82*  < > 1.46*  CALCIUM 8.6*  < > 8.5*  AST 20  --   --   ALT 13*  --   --   ALKPHOS 149*  --   --   BILITOT 1.3*  --   --   < > = values in this interval not displayed. RADIOLOGY:  Dg Chest 2 View  07/13/2016  CLINICAL DATA:  Postoperative evaluation. Patient status post placement of pleural drainage catheter. EXAM: CHEST  2 VIEW COMPARISON:  Chest radiograph 07/12/2016 FINDINGS: Single lead pacer device overlies the left hemi thorax. Stable enlarged cardiac and mediastinal contours. Monitoring leads overlie the patient. Right chest tube remains in place. Elevation right hemidiaphragm. Heterogeneous opacities right lung base. Small right pleural effusion. No definite right-sided pneumothorax identified. Left lung is clear. IMPRESSION: Right chest tube remains in place with persistent small right pleural effusion and underlying right  basilar opacities favored to represent atelectasis. No definite right pneumothorax identified. Electronically Signed   By: Lovey Newcomer M.D.   On: 07/13/2016 08:09   Dg Chest Port 1 View  07/12/2016  CLINICAL DATA:  Status post placement of all pleural drainage catheter today with development of a pneumothorax. EXAM: PORTABLE CHEST 1 VIEW  COMPARISON:  Single-view of the chest earlier today and 07/11/2016. FINDINGS: Pleural drainage catheter is again seen and appears to have been pulled back although the tip is difficult to localize. Large right pneumothorax seen on the study earlier today has resolved. The left lung is clear. There is a small amount of pleural fluid on the right. Heart size is enlarged. IMPRESSION: Resolved right pneumothorax with a pleural drainage catheter in place. Small right pleural effusion is noted. Electronically Signed   By: Inge Rise M.D.   On: 07/12/2016 14:35   ASSESSMENT AND PLAN:   1. Chronic systolic congestive heart failure EF 35%. Stable. Continue low-dose torsemide and Coreg. Defer to cardiologist to add ACE inhibitor after procedure since chronic kidney disease seems stable.  2. History of coronary artery disease. In April 2017 had a high risk stress test. Cardiac catheter not done secondary to chronic kidney disease. Medical management.  3. Chronic kidney disease stage III. This is stable from previous creatinines.  4. 15% pneumothorax after thoracentesis.  S/p Insertion of right Pleurx catheter.  CXR after procedure: Resolved right pneumothorax with a pleural drainage catheter in place. Small right pleural effusion is noted.  5. Recurrent right pleural effusion. S/p Insertion of right Pleurx catheter. Eliquis was on hold for Pleurx catheter.  Resumed eliquis.  6. Type 2 diabetes mellitus, hold glipizide. Continue sliding scale. 7. Anxiety on Ativan 8.  Chronic anemia. Send off iron studies. Likely anemia of chronic disease. Stable. 10. History of pulmonary embolism and history of atrial fibrillation. Higher risk of stroke off eliquis.  Resume anticoagulation after surgery.  All the records are reviewed and case discussed with Care Management/Social Worker. Management plans discussed with the patient, family and they are in agreement.  CODE STATUS: DNR  TOTAL TIME TAKING CARE OF  THIS PATIENT: 23 minutes.   More than 50% of the time was spent in counseling/coordination of care: YES  The patient will be D/C to home with hospice care today per Dr. Genevive Bi.   Demetrios Loll M.D on 07/13/2016 at 1:15 PM  Between 7am to 6pm - Pager - [phone]  After 6pm go to [email or web address] - Proofreader  [facility]  Office  [phone]  CC: Primary care physician; Madelyn Brunner, MD  Note: This dictation was prepared with Dragon dictation along with smaller phrase technology. Any transcriptional errors that result from this process are unintentional.

## 2016-07-13 NOTE — Discharge Summary (Signed)
Physician Discharge Summary  Patient ID: Donald Henry MRN: [id number] DOB/AGE: [birth year] 82 y.o.  Admit date: 07/09/2016 Discharge date: 07/13/2016   Discharge Diagnoses:  Principal Problem:   Pneumothorax Active Problems:   Chronic atrial fibrillation (HCC)   Sick sinus syndrome (HCC)   Chronic kidney disease (CKD), stage III (moderate)   Chronic combined systolic and diastolic CHF, NYHA class 3 (HCC)   Hypertensive heart disease   Anemia of chronic disease   Pacemaker   Preoperative cardiovascular examination   Recurrent pleural effusion on right   Procedures:Insertion of right Pleurx catheter  Hospital Course: This patient was admitted through the office after a thoracentesis on the right. The chest x-ray after the thoracentesis revealed a pneumothorax and he was admitted to the hospital for observation and management. On 07/12/2016 he was taken to the operating room and underwent insertion of a Pleurx catheter. His postoperative chest x-ray showed a pneumothorax which resolved and he had no air leak at the time of discharge. He was seen by our social workers and hospice care. They have arrange for hospice to take care of the catheter at home. This has been coordinated by the son who is intimately involved with his father's care.  At the time of discharge his chest x-ray showed only a trace pleural effusion with no pneumothorax. The son will be instructed on how to change the dressing. He was given a prescription for Percocet 1-2 tablets taken by mouth every 4 hours as needed. Dispense #20. His other medications remain unchanged. He will follow-up with me in 2 weeks for suture removal. He was also instructed to follow-up with Dr. Raul Del who has been managing his pleural effusions.  Disposition: 01-Home or Self Care  Discharge Instructions    Call MD for:  difficulty breathing, headache or visual disturbances    Complete by:  As directed      Call MD for:  persistant nausea  and vomiting    Complete by:  As directed      Call MD for:  redness, tenderness, or signs of infection (pain, swelling, redness, odor or green/yellow discharge around incision site)    Complete by:  As directed      Call MD for:  severe uncontrolled pain    Complete by:  As directed      Call MD for:  temperature >100.4    Complete by:  As directed      Diet - low sodium heart healthy    Complete by:  As directed      Discharge instructions    Complete by:  As directed   Change the dressing and drain the pleural catheter every other day until seen in the office. Make an appointment to see Dr. Genevive Bi in the clinic in 2 weeks. Make an appointment to see Dr. Raul Del in 2 weeks. No shower or getting the wound wet.     Increase activity slowly    Complete by:  As directed             Medication List    STOP taking these medications        traMADol 50 MG tablet  Commonly known as:  ULTRAM     zolpidem 5 MG tablet  Commonly known as:  AMBIEN      TAKE these medications        carvedilol 6.25 MG tablet  Commonly known as:  COREG  Take 1 tablet (6.25 mg total) by mouth 2 (two) times  daily.     ELIQUIS 2.5 MG Tabs tablet  Generic drug:  apixaban  Take 2.5 mg by mouth 2 (two) times daily.     glipiZIDE 10 MG tablet  Commonly known as:  GLUCOTROL  Take 10 mg by mouth daily before breakfast.     isosorbide mononitrate 30 MG 24 hr tablet  Commonly known as:  IMDUR  Take 30 mg by mouth daily.     LORazepam 0.5 MG tablet  Commonly known as:  ATIVAN  Take 0.5 mg by mouth every 8 (eight) hours as needed for anxiety.     torsemide 20 MG tablet  Commonly known as:  DEMADEX  Take 1 tablet (20 mg total) by mouth daily.     vitamin B-12 1000 MCG tablet  Commonly known as:  CYANOCOBALAMIN  Take 1,000 mcg by mouth daily.         [name], MD

## 2016-07-13 NOTE — Care Management (Signed)
Delivered 1 boxed pleurex started kit and 4 bag started kits from central supply to patient. Flo Shanks with hospice updated.

## 2016-07-13 NOTE — Progress Notes (Signed)
[facility]  Patient ID: COSIMO DOCTOR, male   DOB: [birth year], 82 y.o.   MRN: [id number]  HISTORY: This patient had an uneventful night. He has minimal pain. He is not short of breath. There is no air leak. The dressings are clean and dry.   Filed Vitals:   07/13/16 0334 07/13/16 0843  BP: 112/49 126/64  Pulse: 59 60  Temp: 98 F (36.7 C) 97.9 F (36.6 C)  Resp: 18 17     EXAM: Resp: Lungs are clear bilaterally.  No respiratory distress, normal effort. Heart:  Regular without murmurs Abd:  Abdomen is soft, non distended and non tender. No masses are palpable.  There is no rebound and no guarding.  Neurological: Alert and oriented to person, place, and time. Coordination normal.  Skin: Skin is warm and dry. No rash noted. No diaphoretic. No erythema. No pallor.  Psychiatric: Normal mood and affect. Normal behavior. Judgment and thought content normal.   There is no air leak with vigorous cough.  Independent review the chest x-ray today shows no pneumothorax.  The drainage is clear yellow.   ASSESSMENT: Overall the patient has tolerated his intrapleural drainage well. He will be discharged to home today. The son has been working with hospice to arrange for hospice care.   PLAN:   I did provide him a prescription for Percocet 5/325 one to 2 tablets every 4 hours as needed for pain #20 I would advise we drain the catheter every other day at least until he seen back in the office. I would also recommend changing the dressing every other day with the drainage procedure. I explained this in detail to the son. I also explained this to the patient. I will see him back again in 2 weeks' time.    [name], MD

## 2016-07-13 NOTE — Progress Notes (Signed)
Follow up visit made to new referral for [facility] services at home. Patient seen sitting up in bed, eating breakfast, alert and oriented, looking forward to discharge today. He remains off of oxygen.Writer has followed up with [facility] Lattie Haw regarding canisters and draining kit, these will be provided at discharge. No DME needs at this time. Discharge summary faxed to Hospice referral. Thank you.  Flo Shanks RN, BSN, CH{Pn [facility], [facility] [phone] c

## 2016-07-13 NOTE — Progress Notes (Signed)
Pt discharged to home via wc.  Instructions and rx(Percocet) and Pleurax catheter starter kit  given to pt.  Questions answered.  No distress.

## 2016-07-13 NOTE — Telephone Encounter (Signed)
I called Cecilie Lowers (son) and confirmed the appointment for His father on August 1 at 9:00 am. Patient is aware

## 2016-07-13 NOTE — Progress Notes (Signed)
To xray via bed 

## 2016-07-14 ENCOUNTER — Ambulatory Visit: Payer: Medicare Other

## 2016-07-14 LAB — CYTOLOGY - NON PAP

## 2016-07-16 ENCOUNTER — Encounter: Payer: Medicare Other | Admitting: Cardiothoracic Surgery

## 2016-07-16 LAB — GLUCOSE, CAPILLARY
GLUCOSE-CAPILLARY: 245 mg/dL — AB (ref 65–99)
GLUCOSE-CAPILLARY: 163 mg/dL — AB (ref 65–99)
GLUCOSE-CAPILLARY: 170 mg/dL — AB (ref 65–99)
GLUCOSE-CAPILLARY: 179 mg/dL — AB (ref 65–99)
GLUCOSE-CAPILLARY: 333 mg/dL — AB (ref 65–99)
GLUCOSE-CAPILLARY: 123 mg/dL — AB (ref 65–99)
Glucose-Capillary: 101 mg/dL — ABNORMAL HIGH (ref 65–99)
Glucose-Capillary: 133 mg/dL — ABNORMAL HIGH (ref 65–99)
Glucose-Capillary: 157 mg/dL — ABNORMAL HIGH (ref 65–99)

## 2016-07-16 LAB — ACID FAST CULTURE WITH REFLEXED SENSITIVITIES: ACID FAST CULTURE - AFSCU3: NEGATIVE

## 2016-07-20 ENCOUNTER — Other Ambulatory Visit: Payer: Self-pay | Admitting: Cardiothoracic Surgery

## 2016-07-20 DIAGNOSIS — J90 Pleural effusion, not elsewhere classified: Secondary | ICD-10-CM

## 2016-07-21 ENCOUNTER — Ambulatory Visit: Payer: Medicare Other

## 2016-07-23 ENCOUNTER — Ambulatory Visit (INDEPENDENT_AMBULATORY_CARE_PROVIDER_SITE_OTHER): Admitting: Cardiothoracic Surgery

## 2016-07-23 ENCOUNTER — Ambulatory Visit
Admission: RE | Admit: 2016-07-23 | Discharge: 2016-07-23 | Disposition: A | Discharge status: Home / Self Care | Source: Ambulatory Visit | Attending: Cardiothoracic Surgery | Admitting: Cardiothoracic Surgery

## 2016-07-23 ENCOUNTER — Encounter: Payer: Self-pay | Admitting: Cardiothoracic Surgery

## 2016-07-23 VITALS — BP 116/67 | HR 69

## 2016-07-23 DIAGNOSIS — J90 Pleural effusion, not elsewhere classified: Secondary | ICD-10-CM | POA: Diagnosis present

## 2016-07-23 DIAGNOSIS — R938 Abnormal findings on diagnostic imaging of other specified body structures: Secondary | ICD-10-CM | POA: Diagnosis not present

## 2016-07-23 DIAGNOSIS — I255 Ischemic cardiomyopathy: Secondary | ICD-10-CM

## 2016-07-23 NOTE — Progress Notes (Signed)
[facility] Post-[facility]  Patient ID: Donald Henry, male   DOB: [birth year], 82 y.o.   MRN: [id number]  HISTORY: He has done well since discharge. A hospice facility nurse has been coming in draining the Pleurx every other day. Approximately 300-350 cc her being withdrawn every other day. He denies any pain or fever or chills.   Vitals:   07/23/16 0950  BP: 116/67  Pulse: 69     EXAM: Resp: Lungs are clear bilaterally.  No respiratory distress, normal effort. Heart:  Regular without murmurs Neurological: Alert and oriented to person, place, and time. Coordination normal.  Skin: Skin is warm and dry. No rash noted. No diaphoretic. No erythema. No pallor.  Psychiatric: Normal mood and affect. Normal behavior. Judgment and thought content normal.  I did remove all the sutures from his wounds. They are healing as expected. There is no drainage or erythema.  ASSESSMENT: We did get a chest x-ray today which shows a small pleural effusion on the right. Overall looks very good.   PLAN:   I explained to him that we could go to every 3 or 4 days on the drainage. He will relay this to the nurses. He is scheduled to follow-up with Dr. Raul Del in the next few days. I told him that he could get in the shower with the dressing covered with the large Tegaderm. After that he can change the dressing.    [name], MD

## 2016-07-23 NOTE — Patient Instructions (Signed)
Please give us a call if you have any questions or concerns. 

## 2016-07-27 ENCOUNTER — Encounter: Payer: Medicare Other | Admitting: Cardiothoracic Surgery

## 2016-07-28 ENCOUNTER — Ambulatory Visit: Payer: Medicare Other

## 2016-08-03 ENCOUNTER — Encounter: Payer: Self-pay | Admitting: Internal Medicine

## 2016-08-03 ENCOUNTER — Ambulatory Visit (INDEPENDENT_AMBULATORY_CARE_PROVIDER_SITE_OTHER): Admitting: Internal Medicine

## 2016-08-03 VITALS — BP 100/60 | HR 66 | Ht 67.0 in | Wt 169.0 lb

## 2016-08-03 DIAGNOSIS — Z950 Presence of cardiac pacemaker: Secondary | ICD-10-CM

## 2016-08-03 DIAGNOSIS — I495 Sick sinus syndrome: Secondary | ICD-10-CM

## 2016-08-03 DIAGNOSIS — I482 Chronic atrial fibrillation: Secondary | ICD-10-CM

## 2016-08-03 DIAGNOSIS — I340 Nonrheumatic mitral (valve) insufficiency: Secondary | ICD-10-CM | POA: Diagnosis not present

## 2016-08-03 DIAGNOSIS — I5022 Chronic systolic (congestive) heart failure: Secondary | ICD-10-CM | POA: Diagnosis not present

## 2016-08-03 DIAGNOSIS — I4821 Permanent atrial fibrillation: Secondary | ICD-10-CM

## 2016-08-03 NOTE — Progress Notes (Signed)
ELECTROPHYSIOLOGY CONSULT NOTE  Patient ID: Donald Henry, MRN: [id number], DOB/AGE: [birth year] 80 y.o. Admit date: (Not on file) Date of Consult: 08/03/2016  Primary Physician: Madelyn Brunner, MD Primary Cardiologist: AI A Consulting Physician AI  Chief Complaint:  TO ESTABLISH    HPI Donald Henry is a 82 y.o. male seen to establish pacemaker follow-up. History of permanent atrial fibrillation. He is managed with apixoban  Thromboembolic risk factors are normal for age-51 diabetes-1 coronary disease-1 congestive heart failure-1 retention-1 for a CHADS-VASc score of greater than or equal to 6  Echocardiogram 3/17 demonstrated EF of 30-35% with moderate LAE (46/2.4/45) moderate MR and TR and moderate pulmonary hypertension Myoview 4/17 EF 33% perfusion defect involving anterior and anterolateral wall  There is a history of bilateral pulmonary embolism.  he's also had problems with pleural effusions and underwent thoracentesis 2/17 He has a drain in place.  He has a history of coronary artery disease dating back to his 48s. He reportedly had a heart attack but has never had a catheterization. Stress test negative.  He is ambulatory slowly with a cane. He has some peripheral edema but denies chest pain.  Past Medical History:  Diagnosis Date  . BPH (benign prostatic hyperplasia)   . Chronic combined systolic (congestive) and diastolic (congestive) heart failure ([place])    a. 01/2015 Echo: EF 40%, mod MR/PR/TR;  b. 10/2015 Echo: EF 20-25%, sev MR, sev dil LA/RA, sev TR.  . CKD (chronic kidney disease), stage III   . Diabetes mellitus without complication ([place])   . Hiatal hernia   . Hypertensive heart disease   . Ischemic cardiomyopathy   . Myocardial infarction [facility])    a. MI @ age 62-->conservatively managed.  Has never had a cath.  Reports multiple negative stress tests.  . Normocytic anemia   . Obesity   . Peptic ulcer disease   . Pulmonary embolism ([place])    a. 10/2015 ->Eliquis.  . Recurrent right pleural effusion    a. 01/28/2016 1.7L fluid removal via thoracentesis; b. 02/19/2016 2.4L removal via thoracentesis.  . Severe mitral regurgitation    a. 10/2015 Echo: sev MR.  . SSS (sick sinus syndrome) ([facility])    a. s/p MDT ADSRO1 Single Lead PPM.      Surgical History:  Past Surgical History:  Procedure Laterality Date  . CHEST TUBE INSERTION N/A 07/12/2016   Procedure: INSERTION PLEURAL DRAINAGE CATHETER;  Surgeon: [name], MD;  Location: [facility] ORS;  Service: Thoracic;  Laterality: N/A;  . INSERT / REPLACE / REMOVE PACEMAKER  04/19/2014   [employer]  Serial # [id number]     Home Meds: Prior to Admission medications   Medication Sig Start Date End Date Taking? Authorizing Provider  apixaban (ELIQUIS) 2.5 MG TABS tablet Take 2.5 mg by mouth 2 (two) times daily.    Historical Provider, MD  atorvastatin (LIPITOR) 40 MG tablet Take 1 tablet (40 mg total) by mouth daily. 02/20/16   Max Sane, MD  carvedilol (COREG) 6.25 MG tablet Take 1 tablet (6.25 mg total) by mouth 2 (two) times daily. 03/22/16   Wende Bushy, MD  chlorthalidone (HYGROTON) 25 MG tablet Take 25 mg by mouth daily.    Historical Provider, MD  furosemide (LASIX) 20 MG tablet Take 20 mg by mouth daily. Reported on 04/28/2016    Historical Provider, MD  glipiZIDE (GLUCOTROL) 10 MG tablet Take 10 mg by mouth daily before breakfast.    Historical Provider, MD  isosorbide mononitrate (IMDUR) 30 MG 24 hr tablet Take 30 mg by mouth daily.    Historical Provider, MD  LORazepam (ATIVAN) 0.5 MG tablet Take 0.5 mg by mouth every 8 (eight) hours as needed for anxiety.    Historical Provider, MD  terazosin (HYTRIN) 2 MG capsule Take 2 mg by mouth at bedtime.    Historical Provider, MD  torsemide (DEMADEX) 20 MG tablet Take 1 tablet (20 mg total) by mouth daily. 04/20/16   Wende Bushy, MD  traMADol (ULTRAM) 50 MG tablet Take 50 mg by mouth every 6 (six) hours as needed. Reported on  04/28/2016    Historical Provider, MD  vitamin B-12 (CYANOCOBALAMIN) 1000 MCG tablet Take 1,000 mcg by mouth daily.    Historical Provider, MD  zolpidem (AMBIEN) 5 MG tablet Take 5 mg by mouth at bedtime as needed for sleep.    Historical Provider, MD    Allergies:  Allergies  Allergen Reactions  . Atorvastatin Other (See Comments)    Severe Muscle Cramping  . Aspirin Other (See Comments)    Bleeding from Peptic Ulcer in 1963.    Social History   Social History  . Marital status: Married    Spouse name: N/A  . Number of children: N/A  . Years of education: N/A   Occupational History  . Not on file.   Social History Main Topics  . Smoking status: Never Smoker  . Smokeless tobacco: Never Used  . Alcohol use No  . Drug use: No  . Sexual activity: Not on file   Other Topics Concern  . Not on file   Social History Narrative  . No narrative on file     Family History  Problem Relation Age of Onset  . Heart disease Mother   . Heart disease Father     a. First MI @ 54.  . Leukemia Sister   . Diabetes Mellitus II Sister   . Diabetes Mellitus II Brother   . Heart attack Son     a. MI @ 81  . Heart attack Son     a. MI @ 51.     ROS:  Please see the history of present illness.     All other systems reviewed and negative.    Physical Exam: Blood pressure 100/60, pulse 66, height 5\' 7"  (1.702 m), weight 169 lb (76.7 kg). General: Well developed, well nourished male in no acute distress. Head: Normocephalic, atraumatic, sclera non-icteric, no xanthomas, nares are without discharge. EENT: normal  Lymph Nodes:  none Neck: Negative for carotid bruits.   Back:without scoliosis kyphosis  Lungs: Clear bilaterally to auscultation without wheezes, rales, or rhonchi. Breathing is unlabored. Heart: irregularly irregular rhythm with normal rate and 2/55murmur . No rubs, or gallops appreciated. Device pocket well healed; without hematoma or erythema.  There is no tethering    Abdomen: Soft, non-tender, non-distended with normoactive bowel sounds. No hepatomegaly. No rebound/guarding. No obvious abdominal masses. Msk:  Strength and tone appear normal for age. Extremities: No clubbing or cyanosis.  Tr+  edema.  Distal pedal pulses are 2+ and equal bilaterally. Skin: Warm and Dry Neuro: Alert and oriented X 3. CN III-XII intact Grossly normal sensory and motor function . Psych:  Responds to questions appropriately with a normal affect.      Labs: Cardiac Enzymes No results for input(s): CKTOTAL, CKMB, TROPONINI in the last 72 hours. CBC Lab Results  Component Value Date   WBC 8.2 07/13/2016   HGB 11.0 (L)  07/13/2016   HCT 31.1 (L) 07/13/2016   MCV 87.1 07/13/2016   PLT 263 07/13/2016   PROTIME: No results for input(s): LABPROT, INR in the last 72 hours. Chemistry No results for input(s): NA, K, CL, CO2, BUN, CREATININE, CALCIUM, PROT, BILITOT, ALKPHOS, ALT, AST, GLUCOSE in the last 168 hours.  Invalid input(s): LABALBU Lipids No results found for: CHOL, HDL, LDLCALC, TRIG BNP No results found for: PROBNP Thyroid Function Tests: No results for input(s): TSH, T4TOTAL, T3FREE, THYROIDAB in the last 72 hours.  Invalid input(s): FREET3 Miscellaneous No results found for: DDIMER  Radiology/Studies:  Dg Chest 1 View  Result Date: 07/11/2016 CLINICAL DATA:  Followup pneumothorax. EXAM: CHEST 1 VIEW COMPARISON:  07/10/2016 FINDINGS: There is a left chest wall pacer device with lead in the right ventricle. Pneumothorax overlying the right upper lobe measures 2.1 cm in thickness. This is compared with 1.9 cm on previous exam. Allowing for differences in technique and patient positioning this is not significantly changed. Right pleural effusion and right basilar atelectasis is again noted. IMPRESSION: 1. No significant change in small to moderate right upper lobe pneumothorax. 2. Right pleural effusion 3. These results will be called to the ordering clinician  or representative by the Radiologist Assistant, and communication documented in the PACS or zVision Dashboard. Electronically Signed   By: Kerby Moors M.D.   On: 07/11/2016 08:20   Dg Chest 1 View  Result Date: 07/07/2016 CLINICAL DATA:  Post large volume right-sided thoracentesis yielding 2.6 L of fluid EXAM: CHEST 1 VIEW COMPARISON:  Chest radiograph - 06/30/2016; 06/23/2016; multiple previous ultrasound-guided thoracentesis. FINDINGS: Grossly unchanged cardiac silhouette and mediastinal contours with atherosclerotic plaque within thoracic aorta. Stable position of support apparatus. Interval reduction of persistent trace right-sided effusion post large volume thoracentesis with development of a small ex vacuo pneumothorax. No mediastinal shift. Improved aeration of the right lower lung with persistent bibasilar opacities, likely atelectasis. No left-sided effusion. No evidence of edema. No acute osseus abnormalities. IMPRESSION: Interval reduction of persistent trace right-sided effusion post large volume thoracentesis with development of a small ex vacuo pneumothorax. The patient was evaluated by the dictating and performing interventional radiologist and remains asymptomatic. As such, the patient was discharged home. The patient was instructed to return to the emergency department if he were to develop worsening shortness of breath. Electronically Signed   By: Sandi Mariscal M.D.   On: 07/07/2016 15:19   Dg Chest 2 View  Result Date: 07/23/2016 CLINICAL DATA:  Pleural effusion and pneumothorax. EXAM: CHEST  2 VIEW COMPARISON:  07/13/2016 FINDINGS: Cardiomediastinal silhouette is stably enlarged. Mediastinal contours appear intact. Atherosclerotic disease and tortuosity of the aorta noted. There is no evidence of significant pneumothorax. Right chest tube is in place. There is persistent to increased right pleural effusion. Right lower lobe atelectasis versus airspace consolidation is also present.  Osseous structures are without acute abnormality. Soft tissues are grossly normal. IMPRESSION: Increase in the volume of right pleural effusion. Persistent right lower lobe atelectasis versus airspace consolidation. No radiographically apparent pneumothorax. Electronically Signed   By: Fidela Salisbury M.D.   On: 07/23/2016 09:05  Dg Chest 2 View  Result Date: 07/13/2016 CLINICAL DATA:  Postoperative evaluation. Patient status post placement of pleural drainage catheter. EXAM: CHEST  2 VIEW COMPARISON:  Chest radiograph 07/12/2016 FINDINGS: Single lead pacer device overlies the left hemi thorax. Stable enlarged cardiac and mediastinal contours. Monitoring leads overlie the patient. Right chest tube remains in place. Elevation right hemidiaphragm. Heterogeneous opacities right  lung base. Small right pleural effusion. No definite right-sided pneumothorax identified. Left lung is clear. IMPRESSION: Right chest tube remains in place with persistent small right pleural effusion and underlying right basilar opacities favored to represent atelectasis. No definite right pneumothorax identified. Electronically Signed   By: Lovey Newcomer M.D.   On: 07/13/2016 08:09   Dg Chest 2 View  Result Date: 07/10/2016 CLINICAL DATA:  Follow-up right pneumonia EXAM: CHEST  2 VIEW COMPARISON:  07/09/2016 and prior exams FINDINGS: Cardiomegaly and left-sided pacemaker again noted. A small- moderate right pneumothorax has slightly increased. Right pleural fluid and right lower lung atelectasis/ airspace disease again noted. No other significant changes identified. IMPRESSION: Slightly increasing small-moderate right pneumothorax. Right pleural fluid and right lower lung atelectasis/ airspace disease again noted. Cardiomegaly. Electronically Signed   By: Margarette Canada M.D.   On: 07/10/2016 15:03   Dg Chest 2 View  Result Date: 07/09/2016 CLINICAL DATA:  Followup pneumothorax. EXAM: CHEST  2 VIEW COMPARISON:  07/07/2016 FINDINGS:  The heart is mildly enlarged but stable. Stable tortuosity and calcification of the thoracic aorta. Very low lung volumes could be due to expiratory chest film. The pneumothorax appears larger but I think it is due to the difference in inspiratory volume compared with the prior study. It is estimated at 15%. There is a persistent small right pleural effusion and overlying atelectasis. IMPRESSION: Persistent 15% right-sided pneumothorax. It appears slightly larger at the apex but is no longer visualized at the right lung base. The difference is likely due to degree of inspiration. Small right effusion and right lower lobe atelectasis. Electronically Signed   By: Marijo Sanes M.D.   On: 07/09/2016 12:31   Dg Chest Port 1 View  Result Date: 07/12/2016 CLINICAL DATA:  Status post placement of all pleural drainage catheter today with development of a pneumothorax. EXAM: PORTABLE CHEST 1 VIEW COMPARISON:  Single-view of the chest earlier today and 07/11/2016. FINDINGS: Pleural drainage catheter is again seen and appears to have been pulled back although the tip is difficult to localize. Large right pneumothorax seen on the study earlier today has resolved. The left lung is clear. There is a small amount of pleural fluid on the right. Heart size is enlarged. IMPRESSION: Resolved right pneumothorax with a pleural drainage catheter in place. Small right pleural effusion is noted. Electronically Signed   By: Inge Rise M.D.   On: 07/12/2016 14:35   Dg Chest Port 1 View  Result Date: 07/12/2016 CLINICAL DATA:  Status post Pleurx catheter placement. EXAM: PORTABLE CHEST 1 VIEW COMPARISON:  Radiograph July 11, 2016. FINDINGS: Stable cardiomediastinal silhouette. Left-sided pacemaker is unchanged in position. Left lung is clear. Large right pneumothorax is noted which is significantly increased in size compared to prior exam. There is also been interval placement of pleural drainage catheter with distal tip in the  right upper lobe. Bony thorax is unremarkable. IMPRESSION: Status post placement of pleural drainage catheter on right side. Large right pneumothorax is now noted which is significantly in increased in size compared to prior exam. Critical Value/emergent results were called by telephone at the time of interpretation on 07/12/2016 at 9:03 am to Loren Racer, the patient's nurse, who verbally acknowledged these results and will immediately contact Dr. Genevive Bi. Electronically Signed   By: Marijo Conception, M.D.   On: 07/12/2016 09:03   US Thoracentesis Asp Pleural Space W/img Guide  Result Date: 07/07/2016 INDICATION: History of chronic congestive heart failure with recurrent symptomatic right-sided pleural effusion.  Note, patient has been evaluated at the interventional radiology clinic (on 05/05/2016 by Dr. Anselm Pancoast) regarding placement of a tunneled pleural catheter though still remains undecided. EXAM: US THORACENTESIS ASP PLEURAL SPACE W/IMG GUIDE COMPARISON:  Multiple previous ultrasound-guided paracenteses MEDICATIONS: None. COMPLICATIONS: SIR Level A - No therapy, no consequence. Post thoracentesis chest radiograph demonstrated development of small asymptomatic ex vacuo pneumothorax. TECHNIQUE: Informed written consent was obtained from the patient after a discussion of the risks, benefits and alternatives to treatment. A timeout was performed prior to the initiation of the procedure. Initial ultrasound scanning demonstrates a large pleural effusion. The lower chest was prepped and draped in the usual sterile fashion. 1% lidocaine was used for local anesthesia. An ultrasound image was saved for documentation purposes. An 8 Fr Safe-T-Centesis catheter was introduced. The thoracentesis was performed. The catheter was removed and a dressing was applied. The patient tolerated the procedure well without immediate post procedural complication. The patient was escorted to have an upright chest radiograph. FINDINGS: A total  of approximately 2.6 liters of serous fluid was removed. Requested samples were sent to the laboratory. IMPRESSION: Successful ultrasound-guided right sided thoracentesis yielding 2.6 liters of pleural fluid. Procedure complicated by development of a postprocedural asymptomatic ex vacuo pneumothorax following this large volume thoracentesis. Electronically Signed   By: Sandi Mariscal M.D.   On: 07/07/2016 15:21        Assessment and Plan:  Bradycardia  Atrial fibrillation-permanent  Ischemic cardiomyopathy  I don't know how to correct the MAR in the above note, but he is no longer on any anticoagulant. He is taking aspirin  His diuretic regime has been clarified.  Device function is normal we will see him again in 6 months as he is not doing remote monitoring     Donald Henry

## 2016-08-03 NOTE — Patient Instructions (Signed)
Medication Instructions: - Your physician recommends that you continue on your current medications as directed. Please refer to the Current Medication list given to you today.  Labwork: - none  Procedures/Testing: - none  Follow-Up: - Your physician wants you to follow-up in: 6 months with Dr. Klein. You will receive a reminder letter in the mail two months in advance. If you don't receive a letter, please call our office to schedule the follow-up appointment.  Any Additional Special Instructions Will Be Listed Below (If Applicable).     If you need a refill on your cardiac medications before your next appointment, please call your pharmacy.   

## 2016-08-04 ENCOUNTER — Ambulatory Visit: Payer: Medicare Other

## 2016-08-09 ENCOUNTER — Encounter: Payer: Self-pay | Admitting: Internal Medicine

## 2016-08-19 ENCOUNTER — Telehealth: Payer: Self-pay

## 2016-08-19 NOTE — Telephone Encounter (Signed)
Called Dr. Genevive Bi and explained what was going on with patient. He stated that he does not follow up patient's. They are followed up by their Pulmonologist. He stated that he only places the catheter. However, if patient has an infection around the catheter, then he would definitely follow up with the patient. Dr. Genevive Bi stated that [facility] Nurse) should call his Pulmonologist (Dr. Raul Del).  I then called [name] to inform what was said by Dr. Genevive Bi. She stated that she would call Dr. Gust Brooms office. I asked if she had his number and she stated that she did. I told her that if she needed additional help to care for the patient, to please call me. She agreed.

## 2016-08-19 NOTE — Telephone Encounter (Signed)
Stephanie from [facility] called stating that patient's pleural drain is draining lesser amount of fluid. She stated that last Wednesday (08/11/2016) he drained 700 ML's. Then on Saturday (08/14/2016) it only drained about 400 ML's and on Tuesday (08/17/2016) it only drained 250 ML's. Since she and patient's family members were worried about him, she went today to check up on him. She stated that she was only able to drain 100 ML's. [name] also stated that he gets shortness of breath with exertion and she heard his lungs and heard crackles in his lungs. I told [name] that I will speak with Dr. Genevive Bi to see if patient needs to be seen by him or to send him to the Emergency Room.

## 2016-08-20 ENCOUNTER — Telehealth: Payer: Self-pay

## 2016-08-20 NOTE — Telephone Encounter (Signed)
Donald Henry from Dr. Gust Brooms office called stating that Donald Henry nurse) called stating that she was having difficulty draining his catheter. Donald Henry then stated that she did not know what to do with the patient because Dr. Raul Del was not at the office until Monday. I told her that Dr. Genevive Bi had stated that he only places the catheters but does not follow up. Donald Henry then stated that she would tell [name] to take patient to the ED. After this conversation, I called Dr. Genevive Bi and he recommended for patient to have a chest x-ray done first and then come to our office. I then called [name] and she stated that the patient was asleep because he had taken Ambien but as soon as he would wake up, she would take him the ED.

## 2016-08-24 ENCOUNTER — Telehealth: Payer: Self-pay

## 2016-08-24 ENCOUNTER — Other Ambulatory Visit: Payer: Self-pay | Admitting: Cardiothoracic Surgery

## 2016-08-24 DIAGNOSIS — J90 Pleural effusion, not elsewhere classified: Secondary | ICD-10-CM

## 2016-08-24 NOTE — Telephone Encounter (Signed)
Note    [name] from [facility] called stating that the patient was recommended to go to the ED on Friday 08/20/2016 and the patient declined since his catheter is malfunctioning. [name] stated that a nurse was sent to see visit the patient on Saturday 08/21/2016 and was not able to drain his catheter. Today [name] tried to drain his catheter and was not able to. She then contacted his Pulmonologist and was told that patient needed to be seen by Dr. Genevive Bi since it's a catheter malfunction. I went ahead and told [name] that I agreed on seeing the patient. She then stated that she would tell his son to call us and schedule his appointment. Patient's son called and we will be seeing him on Friday 08/27/2016.

## 2016-08-25 ENCOUNTER — Ambulatory Visit
Admission: RE | Admit: 2016-08-25 | Discharge: 2016-08-25 | Disposition: A | Discharge status: Home / Self Care | Source: Ambulatory Visit | Attending: Cardiothoracic Surgery | Admitting: Cardiothoracic Surgery

## 2016-08-25 ENCOUNTER — Ambulatory Visit
Admission: RE | Admit: 2016-08-25 | Discharge: 2016-08-25 | Disposition: A | Discharge status: Home / Self Care | Source: Ambulatory Visit | Attending: Specialist | Admitting: Specialist

## 2016-08-25 DIAGNOSIS — I700 Atherosclerosis of aorta: Secondary | ICD-10-CM | POA: Diagnosis not present

## 2016-08-25 DIAGNOSIS — Z09 Encounter for follow-up examination after completed treatment for conditions other than malignant neoplasm: Secondary | ICD-10-CM | POA: Insufficient documentation

## 2016-08-25 DIAGNOSIS — J90 Pleural effusion, not elsewhere classified: Secondary | ICD-10-CM | POA: Diagnosis not present

## 2016-08-27 ENCOUNTER — Encounter: Payer: Self-pay | Admitting: Cardiothoracic Surgery

## 2016-08-27 ENCOUNTER — Ambulatory Visit (INDEPENDENT_AMBULATORY_CARE_PROVIDER_SITE_OTHER): Admitting: Cardiothoracic Surgery

## 2016-08-27 VITALS — BP 124/70 | HR 64 | Temp 97.9°F

## 2016-08-27 DIAGNOSIS — I255 Ischemic cardiomyopathy: Secondary | ICD-10-CM

## 2016-08-27 DIAGNOSIS — I5022 Chronic systolic (congestive) heart failure: Secondary | ICD-10-CM | POA: Diagnosis not present

## 2016-08-27 MED ORDER — ALTEPLASE 2 MG IJ SOLR: Freq: Once | INTRAMUSCULAR | Status: DC

## 2016-08-27 MED ORDER — ALTEPLASE 2 MG IJ SOLR
2.0000 mg | Freq: Once | INTRAMUSCULAR | Status: AC
  Administered 2016-08-31: 2 mg

## 2016-08-27 NOTE — Addendum Note (Signed)
Addended by: Phillips Odor on: 08/27/2016 04:38 PM   Modules accepted: Orders

## 2016-08-27 NOTE — Progress Notes (Signed)
Donald Henry Inpatient Post-Op Note  Patient ID: Donald Henry, male   DOB: [birth year], 82 y.o.   MRN: [id number]  HISTORY: Donald Henry is a gentleman who has a Pleurx catheter placed on the right for recurrent pleural effusion secondary to heart failure. He states that over the last several visits the home health agency has been unable to drain the catheter. He did have a chest x-ray made which shows a large right-sided pleural effusion. He states that he gets quite short of breath and would like something done to help him.   Vitals:   08/27/16 0821  BP: 124/70  Pulse: 64  Temp: 97.9 F (36.6 C)     EXAM:    Resp: Lungs Show marked decrease in breath sounds on the right with the left side being clear.  No respiratory distress, normal effort. Heart:  Regular without murmurs Abd:  Abdomen is soft, non distended and non tender. No masses are palpable.  There is no rebound and no guarding.  Neurological: Alert and oriented to person, place, and time. Coordination normal.  Skin: Skin is warm and dry. No rash noted. No diaphoretic. No erythema. No pallor.  Psychiatric: Normal mood and affect. Normal behavior. Judgment and thought content normal.    ASSESSMENT: I have independently reviewed the chest x-ray. There is a pleural effusion present. I did drain the Pleurx catheter today and was unable to remove more than 5-10 cc of clear yellowish fluid. I discussed his care with our Pleurx catheter representative and will need to schedule him to come in for TPA administration.   PLAN:   We will see the patient back next week. I will instill TPA. Hopefully this will unclog his catheter and allow [place] to use it.    [name], MD

## 2016-08-31 ENCOUNTER — Telehealth: Payer: Self-pay | Admitting: Cardiothoracic Surgery

## 2016-08-31 ENCOUNTER — Encounter: Payer: Self-pay | Admitting: *Deleted

## 2016-08-31 ENCOUNTER — Observation Stay
Admission: AD | Admit: 2016-08-31 | Discharge: 2016-09-01 | Disposition: A | Discharge status: Home / Self Care | Source: Ambulatory Visit | Attending: Cardiothoracic Surgery | Admitting: Cardiothoracic Surgery

## 2016-08-31 ENCOUNTER — Ambulatory Visit (INDEPENDENT_AMBULATORY_CARE_PROVIDER_SITE_OTHER): Admitting: Cardiothoracic Surgery

## 2016-08-31 ENCOUNTER — Encounter: Payer: Self-pay | Admitting: Cardiothoracic Surgery

## 2016-08-31 ENCOUNTER — Observation Stay

## 2016-08-31 VITALS — BP 137/75 | HR 79 | Temp 98.2°F | Resp 24 | Ht 67.0 in | Wt 167.0 lb

## 2016-08-31 DIAGNOSIS — I130 Hypertensive heart and chronic kidney disease with heart failure and stage 1 through stage 4 chronic kidney disease, or unspecified chronic kidney disease: Secondary | ICD-10-CM | POA: Diagnosis present

## 2016-08-31 DIAGNOSIS — J90 Pleural effusion, not elsewhere classified: Secondary | ICD-10-CM | POA: Insufficient documentation

## 2016-08-31 DIAGNOSIS — R0600 Dyspnea, unspecified: Secondary | ICD-10-CM | POA: Diagnosis not present

## 2016-08-31 DIAGNOSIS — X58XXXA Exposure to other specified factors, initial encounter: Secondary | ICD-10-CM | POA: Diagnosis not present

## 2016-08-31 DIAGNOSIS — I255 Ischemic cardiomyopathy: Secondary | ICD-10-CM

## 2016-08-31 DIAGNOSIS — T859XXA Unspecified complication of internal prosthetic device, implant and graft, initial encounter: Secondary | ICD-10-CM | POA: Diagnosis not present

## 2016-08-31 DIAGNOSIS — N189 Chronic kidney disease, unspecified: Secondary | ICD-10-CM | POA: Diagnosis not present

## 2016-08-31 DIAGNOSIS — Z09 Encounter for follow-up examination after completed treatment for conditions other than malignant neoplasm: Secondary | ICD-10-CM

## 2016-08-31 LAB — GLUCOSE, CAPILLARY: Glucose-Capillary: 107 mg/dL — ABNORMAL HIGH (ref 65–99)

## 2016-08-31 LAB — CBC WITH DIFFERENTIAL/PLATELET
BASOS PCT: 1 %
Basophils Absolute: 0.1 10*3/uL (ref 0–0.1)
EOS ABS: 0.2 10*3/uL (ref 0–0.7)
Eosinophils Relative: 3 %
HEMATOCRIT: 29.7 % — AB (ref 40.0–52.0)
HEMOGLOBIN: 9.8 g/dL — AB (ref 13.0–18.0)
LYMPHS ABS: 0.6 10*3/uL — AB (ref 1.0–3.6)
Lymphocytes Relative: 10 %
MCH: 30.4 pg (ref 26.0–34.0)
MCHC: 33 g/dL (ref 32.0–36.0)
MCV: 92.1 fL (ref 80.0–100.0)
MONOS PCT: 8 %
Monocytes Absolute: 0.5 10*3/uL (ref 0.2–1.0)
NEUTROS ABS: 5.2 10*3/uL (ref 1.4–6.5)
NEUTROS PCT: 78 %
Platelets: 275 10*3/uL (ref 150–440)
RBC: 3.22 MIL/uL — AB (ref 4.40–5.90)
RDW: 20.8 % — ABNORMAL HIGH (ref 11.5–14.5)
WBC: 6.6 10*3/uL (ref 3.8–10.6)

## 2016-08-31 LAB — COMPREHENSIVE METABOLIC PANEL
ALBUMIN: 3.6 g/dL (ref 3.5–5.0)
ALK PHOS: 146 U/L — AB (ref 38–126)
ALT: 11 U/L — AB (ref 17–63)
AST: 17 U/L (ref 15–41)
Anion gap: 7 (ref 5–15)
BILIRUBIN TOTAL: 1.8 mg/dL — AB (ref 0.3–1.2)
BUN: 35 mg/dL — ABNORMAL HIGH (ref 6–20)
CALCIUM: 8.7 mg/dL — AB (ref 8.9–10.3)
CO2: 30 mmol/L (ref 22–32)
CREATININE: 1.71 mg/dL — AB (ref 0.61–1.24)
Chloride: 99 mmol/L — ABNORMAL LOW (ref 101–111)
GFR calc Af Amer: 41 mL/min — ABNORMAL LOW (ref >60)
GFR calc non Af Amer: 35 mL/min — ABNORMAL LOW (ref >60)
GLUCOSE: 136 mg/dL — AB (ref 65–99)
Potassium: 4.6 mmol/L (ref 3.5–5.1)
SODIUM: 136 mmol/L (ref 135–145)
TOTAL PROTEIN: 7.4 g/dL (ref 6.5–8.1)

## 2016-08-31 LAB — APTT: aPTT: 43 seconds — ABNORMAL HIGH (ref 24–36)

## 2016-08-31 LAB — PROTIME-INR
INR: 1.39
Prothrombin Time: 17.2 seconds — ABNORMAL HIGH (ref 11.4–15.2)

## 2016-08-31 MED ORDER — CHLORHEXIDINE GLUCONATE CLOTH 2 % EX PADS: 6.0000 | MEDICATED_PAD | Freq: Once | CUTANEOUS | Status: DC

## 2016-08-31 MED ORDER — SODIUM CHLORIDE 0.9 % IJ SOLN
Freq: Once | INTRAMUSCULAR | Status: AC
  Administered 2016-08-31: 15:00:00 via INTRAPLEURAL
  Filled 2016-08-31: qty 10

## 2016-08-31 MED ORDER — CEFAZOLIN SODIUM-DEXTROSE 2-4 GM/100ML-% IV SOLN
2.0000 g | INTRAVENOUS | Status: DC
  Filled 2016-08-31: qty 100

## 2016-08-31 MED ORDER — CARVEDILOL 3.125 MG PO TABS: 6.2500 mg | ORAL_TABLET | Freq: Every day | ORAL | Status: DC

## 2016-08-31 MED ORDER — TORSEMIDE 20 MG PO TABS: 20.0000 mg | ORAL_TABLET | Freq: Every day | ORAL | Status: DC

## 2016-08-31 MED ORDER — GLIPIZIDE 5 MG PO TABS
10.0000 mg | ORAL_TABLET | Freq: Two times a day (BID) | ORAL | Status: DC
  Administered 2016-08-31 – 2016-09-01 (×2): 10 mg via ORAL
  Filled 2016-08-31 (×2): qty 2

## 2016-08-31 MED ORDER — SODIUM CHLORIDE 0.9 % IJ SOLN: Freq: Once | INTRAMUSCULAR | Status: DC

## 2016-08-31 MED ORDER — ISOSORBIDE MONONITRATE ER 30 MG PO TB24: 30.0000 mg | ORAL_TABLET | Freq: Every day | ORAL | Status: DC

## 2016-08-31 NOTE — Telephone Encounter (Signed)
Patients son, Cecilie Lowers called and states patient is not doing well. I advised him TPA is available and ready to be instilled. Son states he is at [employer] and cannot bring him. I asked him to call his brother or another family member to see if someone else might bring patient to office for TPA to be administered. He is calling his family and will return my call.

## 2016-08-31 NOTE — Progress Notes (Signed)
Donald Henry Inpatient Post-Op Note  Patient ID: Donald Henry, male   DOB: 11-Aug-1930, 82 y.o.   MRN: [id number]  HISTORY: He comes in today with complaints of shortness of breath. When I saw him last week we have recommended that intrapleural and intracatheter TPA be administered. The patient wished to have that done and presents today for that.   There were no vitals filed for this visit.   EXAM:    Resp: Lungs are clear on the left and markedly diminished on the right.  No respiratory distress, normal effort. Heart:  Regular without murmurs.  Pacemaker present in the left deltopectoral groove area. Abd:  Abdomen is soft, non distended and non tender. No masses are palpable.  There is no rebound and no guarding.  Neurological: Alert and oriented to person, place, and time. Coordination normal.  Skin: Skin is warm and dry. No rash noted. No diaphoretic. No erythema. No pallor.  Psychiatric: Normal mood and affect. Normal behavior. Judgment and thought content normal.    ASSESSMENT: The catheter was irrigated with 10 cc of normal saline containing 2 mg of TPA. Initially they catheter was difficult to flush and I suspect that it was occluded.   PLAN:   We will monitor the patient for an hour and then we will attempt to evacuate the right chest.  After one hour of dwell time, we attempted to drain the right pleural space but could only get about 10 cc of fluid. I reviewed with him the options.  I think it would be reasonable to admit to observation and instill a larger dose (10 mg) of TPA and allow to dwell for 6 hours.  If this does not work, he is agreeable to having the catheter removed and replaced tomorrow.    [name], MD

## 2016-08-31 NOTE — Addendum Note (Signed)
Addended by: [name] E on: 08/31/2016 01:12 PM   Modules accepted: Orders, SmartSet

## 2016-08-31 NOTE — Progress Notes (Signed)
Visit made. Donald Henry is currently followed by [facility]  With a  hospice diagnosis of hypertensive heart and chronic kidney disease. He is a DNR code. He is a DNR code.  Donald Henry was admitted to [facility] from Dr.Oaks office for treatment of an occluded pleurex catheter. Per chart note review plan is for indwelling TPA remaining for 6 hours, if this is ineffective then catheter is to be replaced.  Patient seen lying in bed, alert and interactive, denied pain. Some dyspnea noted with conversation. Patient was able to verbalize the plan. His son Donald Henry is on his way home from the beach and should be in shortly. No family present at time fo visit. Of note patient lives at home, son Donald Henry lives with him and works full time. Xray technician in at end of visit, patient transported to xray. Hospital care team made that he is a current [facility] patient. Will continue to follow and update hospice team. Thank you. Flo Shanks RN, BSN, [facility] [place], [facility] [phone] c

## 2016-09-01 ENCOUNTER — Encounter
Admission: AD | Disposition: A | Discharge status: Home / Self Care | Payer: Self-pay | Source: Ambulatory Visit | Attending: Cardiothoracic Surgery

## 2016-09-01 ENCOUNTER — Encounter: Payer: Self-pay | Admitting: Anesthesiology

## 2016-09-01 DIAGNOSIS — J90 Pleural effusion, not elsewhere classified: Secondary | ICD-10-CM | POA: Diagnosis not present

## 2016-09-01 DIAGNOSIS — T859XXA Unspecified complication of internal prosthetic device, implant and graft, initial encounter: Secondary | ICD-10-CM | POA: Diagnosis not present

## 2016-09-01 SURGERY — CHEST TUBE INSERTION
Anesthesia: Choice | Laterality: Right

## 2016-09-01 SURGICAL SUPPLY — 35 items
BLADE SURG SZ11 CARB STEEL (BLADE) IMPLANT
CANISTER SUCT 1200ML W/VALVE (MISCELLANEOUS) IMPLANT
CHLORAPREP W/TINT 26ML (MISCELLANEOUS) IMPLANT
DRAIN CHEST DRY SUCT SGL (MISCELLANEOUS) IMPLANT
DRAPE LAPAROTOMY 77X122 PED (DRAPES) IMPLANT
ELECT REM PT RETURN 9FT ADLT (ELECTROSURGICAL)
ELECTRODE REM PT RTRN 9FT ADLT (ELECTROSURGICAL) IMPLANT
GAUZE SPONGE 4X4 12PLY STRL (GAUZE/BANDAGES/DRESSINGS) IMPLANT
GLOVE SURG SYN 7.5  E (GLOVE)
GLOVE SURG SYN 7.5 E (GLOVE) IMPLANT
GOWN STRL REUS W/ TWL LRG LVL3 (GOWN DISPOSABLE) IMPLANT
GOWN STRL REUS W/TWL LRG LVL3 (GOWN DISPOSABLE)
KIT PLEURX DRAIN CATH 15.5FR (DRAIN) IMPLANT
KIT RM TURNOVER STRD PROC AR (KITS) IMPLANT
LABEL OR SOLS (LABEL) IMPLANT
MARKER SKIN DUAL TIP RULER LAB (MISCELLANEOUS) IMPLANT
PACK BASIN MINOR ARMC (MISCELLANEOUS) IMPLANT
SUCTION FRAZIER HANDLE 10FR (MISCELLANEOUS)
SUCTION TUBE FRAZIER 10FR DISP (MISCELLANEOUS) IMPLANT
SUT ETH BLK MONO 3 0 FS 1 12/B (SUTURE) IMPLANT
SUT ETHILON 3-0 FS-10 30 BLK (SUTURE)
SUT ETHILON 4-0 (SUTURE)
SUT ETHILON 4-0 FS2 18XMFL BLK (SUTURE)
SUT SILK 0 (SUTURE)
SUT SILK 0 30XBRD TIE 6 (SUTURE) IMPLANT
SUT SILK 1 SH (SUTURE) IMPLANT
SUT VIC AB 0 SH 27 (SUTURE) IMPLANT
SUT VIC AB 2-0 SH 27 (SUTURE)
SUT VIC AB 2-0 SH 27XBRD (SUTURE) IMPLANT
SUT VIC AB 3-0 SH 27 (SUTURE)
SUT VIC AB 3-0 SH 27X BRD (SUTURE) IMPLANT
SUTURE EHLN 3-0 FS-10 30 BLK (SUTURE) IMPLANT
SUTURE ETHLN 4-0 FS2 18XMF BLK (SUTURE) IMPLANT
SYR 30ML LL (SYRINGE) IMPLANT
SYRINGE 10CC LL (SYRINGE) IMPLANT

## 2016-09-01 NOTE — Progress Notes (Signed)
Feels much better today.  Drained 1500 cc from right chest yesterday after TPA administration.  VSS, Afeb Much improved breath sounds on the right Heart regular  Will discharge today Will followup with Dr. Raul Del for his pleural effusion

## 2016-09-01 NOTE — Progress Notes (Signed)
Visit made. Patient seen sitting up in the chair, dressed and ready for discharge, son Daryl at bedside. Per chart notes and discussion with staff RN Tonya pleurex drained 1550 cc last night. Patient reports instructions from Dr. Genevive Bi was for weekly drain now. Hospice team alerted to discharge, patient to contact hospice when he arrives home. Transporting home by car with his son. Discharge summary faxed to hospice triage. Thank you. Flo Shanks RN, BSN, [facility] [place], hospital Liaison [phone] c

## 2016-09-01 NOTE — Anesthesia Preprocedure Evaluation (Deleted)
Anesthesia Evaluation    Airway        Dental   Pulmonary shortness of breath,  Recurrent pleural effusion           Cardiovascular hypertension, + CAD, + Past MI and +CHF    Echo 03/16/16: - Left ventricle: The cavity size was normal. There was moderate   concentric hypertrophy. Systolic function was moderately to   severely reduced. The estimated ejection fraction was in the   range of 30% to 35%. - Mitral valve: There was moderate regurgitation. - Left atrium: The atrium was moderately dilated. - Right atrium: The atrium was mildly dilated. - Tricuspid valve: There was moderate regurgitation. - Pulmonary arteries: Systolic pressure was moderately increased.   PA peak pressure: 50 mm Hg (S). - Inferior vena cava: The vessel was severely dilated. The   respirophasic diameter changes were blunted (< 50%), severely   elevated central venous pressure.  NM myocardial perfusion 03/29/16:  Defect 1: There is a large defect of severe severity present in the mid anterior, mid anterolateral, apical anterior and apex location.  Findings consistent with prior myocardial infarction with moderate peri-infarct ischemia.  This is a high risk study.  Nuclear stress EF: 33%.    Neuro/Psych negative psych ROS   GI/Hepatic hiatal hernia, PUD,   Endo/Other  diabetes, Type 2, Oral Hypoglycemic Agents  Renal/GU CRFRenal disease     Musculoskeletal   Abdominal   Peds  Hematology  (+) anemia ,   Anesthesia Other Findings   Reproductive/Obstetrics                             Anesthesia Physical Anesthesia Plan Anesthesia Quick Evaluation

## 2016-09-01 NOTE — Discharge Summary (Signed)
Physician Discharge Summary  Patient ID: GREGORIE BEHLER MRN: [id number] DOB/AGE: 80-06-1930 80 y.o.  Admit date: 08/31/2016 Discharge date: 09/01/2016   Discharge Diagnoses:  Active Problems:   * No active hospital problems. *   Procedures:Instillation of TPA into right PleurX catheter  Hospital Course: 10 mg of TPA given via PleurX catheter and 1,500 cc returned.  He felt much improved.    Disposition: 50-Hospice/Home  Discharge Instructions    Diet - low sodium heart healthy    Complete by:  As directed   Discharge instructions    Complete by:  As directed   Follow up with Dr. Raul Del as per routine Change dressing around the PleurX catheter every other day Hospice to drain the fluid once per week and prn   Increase activity slowly    Complete by:  As directed       Medication List    TAKE these medications   aspirin 81 MG tablet Take 81 mg by mouth daily.   carvedilol 6.25 MG tablet Commonly known as:  COREG Take 1 tablet (6.25 mg total) by mouth 2 (two) times daily.   glipiZIDE 10 MG tablet Commonly known as:  GLUCOTROL Take 10 mg by mouth daily before breakfast.   isosorbide mononitrate 30 MG 24 hr tablet Commonly known as:  IMDUR Take 30 mg by mouth daily.   LORazepam 0.5 MG tablet Commonly known as:  ATIVAN Take 0.5 mg by mouth every 8 (eight) hours as needed for anxiety.   torsemide 20 MG tablet Commonly known as:  DEMADEX Take 1 tablet (20 mg total) by mouth daily.   vitamin B-12 1000 MCG tablet Commonly known as:  CYANOCOBALAMIN Take 1,000 mcg by mouth daily.        [name], MD

## 2016-09-01 NOTE — Care Management (Signed)
Patient to discharge home today.  Donald Henry with [facility] notified.

## 2016-09-01 NOTE — Care Management Obs Status (Signed)
[facility] NOTIFICATION   Patient Details  Name: ELISANDRO WYKA MRN: [id number] Date of Birth: 1930-07-03   Medicare Observation Status Notification Given:  No (Admitted obs less than 24 hrs)    Hrishikesh Hoeg T, RN 09/01/2016, 10:09 AM

## 2016-09-01 NOTE — Progress Notes (Signed)
Pt had 525 drained from pleurx. Results reported to Dr. Genevive Bi per order. Dr. Genevive Bi ordered to "drain until nothing else drained." Will continue to monitor pt.

## 2016-09-01 NOTE — Progress Notes (Signed)
Pt d/c to home today. IV removed intact.  No Rx's given to pt.  Pt will continue to f/u with home health.  D/C paperwork reviewed and education provided with all questions and concerns addressed.  Pt son at bedside for home transport.

## 2016-09-09 LAB — CUP PACEART INCLINIC DEVICE CHECK
Date Time Interrogation Session: 20170914092359
Implantable Lead Implant Date: 20150423
Implantable Lead Location: 753860
Implantable Lead Model: 5092

## 2016-09-09 NOTE — Progress Notes (Signed)
This encounter was created in error - please disregard.

## 2016-09-15 ENCOUNTER — Telehealth: Payer: Self-pay

## 2016-09-15 DIAGNOSIS — J90 Pleural effusion, not elsewhere classified: Secondary | ICD-10-CM

## 2016-09-15 NOTE — Telephone Encounter (Signed)
Called Bronson back and she stated that the patient's pleurx catheter is not draining any longer. She stated that the first time that she drew from his pleurx catheter was 750 ml, second time was 400 ml, third time was 50 ml and yesterday was 25 ml. She stated that it seems that it was clogged up again. She wanted to know if patient could be seen by Dr. Genevive Bi. I told [name] that I would have to call Dr. Genevive Bi since he was not going to be here on Friday. She understood.  I called Dr. Genevive Bi and explained patient's situation and he stated that he would see him tomorrow morning. Patient is to have a chest x-ray prior to his appointment tomorrow morning.   I then called [name] to let her know and she stated that she would call patient to let him know.

## 2016-09-15 NOTE — Telephone Encounter (Signed)
Please call [name] with Hospice regarding this pt. [phone].

## 2016-09-16 ENCOUNTER — Ambulatory Visit
Admission: RE | Admit: 2016-09-16 | Discharge: 2016-09-16 | Disposition: A | Discharge status: Home / Self Care | Source: Ambulatory Visit | Attending: Cardiothoracic Surgery | Admitting: Cardiothoracic Surgery

## 2016-09-16 ENCOUNTER — Ambulatory Visit (INDEPENDENT_AMBULATORY_CARE_PROVIDER_SITE_OTHER): Admitting: Cardiothoracic Surgery

## 2016-09-16 DIAGNOSIS — I517 Cardiomegaly: Secondary | ICD-10-CM | POA: Diagnosis not present

## 2016-09-16 DIAGNOSIS — J9811 Atelectasis: Secondary | ICD-10-CM | POA: Insufficient documentation

## 2016-09-16 DIAGNOSIS — J90 Pleural effusion, not elsewhere classified: Secondary | ICD-10-CM | POA: Insufficient documentation

## 2016-09-16 DIAGNOSIS — I255 Ischemic cardiomyopathy: Secondary | ICD-10-CM

## 2016-09-16 NOTE — Progress Notes (Signed)
  Patient ID: [name], male   DOB: 1930/08/11, 82 y.o.   MRN: [id number]  HISTORY: Catheter not draining.  Had been in hospital for intrapleural TPA about 2 weeks ago.  Since discharge, drained about 800 cc every 2 or 3 days but no drainage over the last 5 days.  Not significantly more short of breath now compared to post TPA.     There were no vitals filed for this visit.   EXAM:    Resp: Lungs are clear on the left and decreased on the right.  No respiratory distress, normal effort. Heart:  Regular without murmurs Abd:  Abdomen is soft, non distended and non tender. No masses are palpable.  There is no rebound and no guarding.  Neurological: Alert and oriented to person, place, and time. Coordination normal.  Skin: Skin is warm and dry. No rash noted. No diaphoretic. No erythema. No pallor.  Psychiatric: Normal mood and affect. Normal behavior. Judgment and thought content normal.    ASSESSMENT: CXRay from today shows a small to mod right pleural effusion.     PLAN:   Went over his films with interventional radiology.  They did not think there was sufficient fluid for safe thoracentesis.  They did not recommend replacement of the tunneled catheter.  They did recommend a repeat CT of the chest.  I called the son and explained this to him.  Will schedule and see again next week.    Donald Lewandowsky, MD

## 2016-09-19 IMAGING — US US THORACENTESIS ASP PLEURAL SPACE W/IMG GUIDE
1 series · 4 of 4 positions shown · non-contrast
Comparison: none

INDICATION: Right-sided pleural effusion

[Series 1: us thoracentesis asp pleural space w/img guide · 0.30mm/px · 4 of 4 slices shown]
[im 1/4]
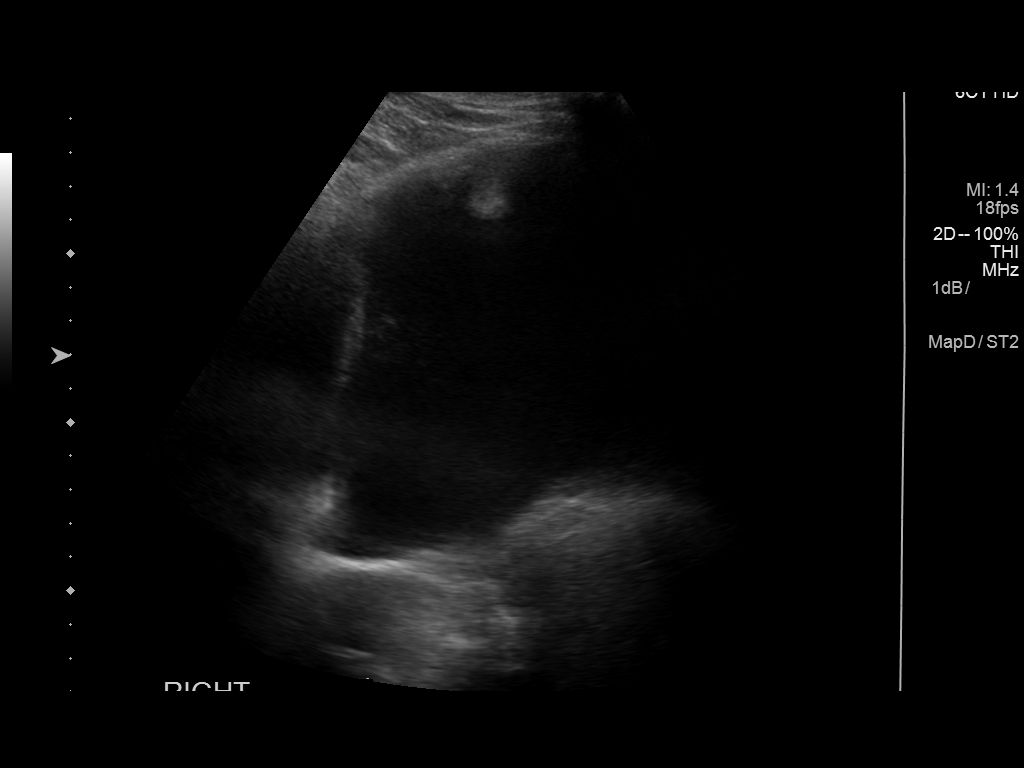
[im 2/4]
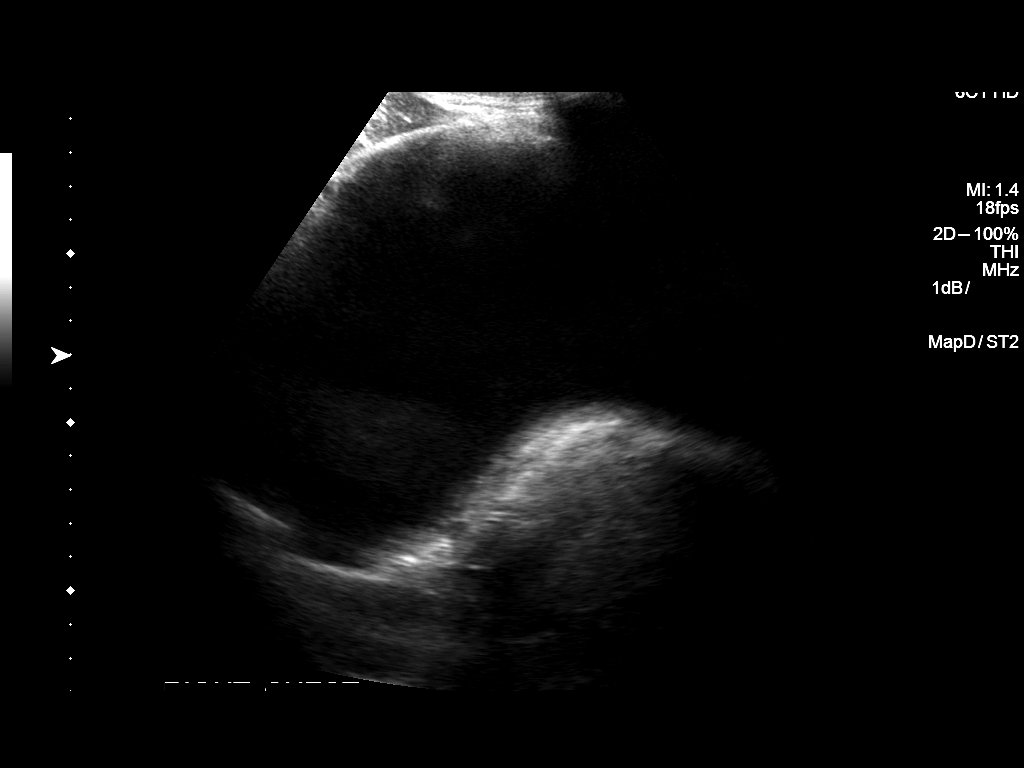
[im 3/4]
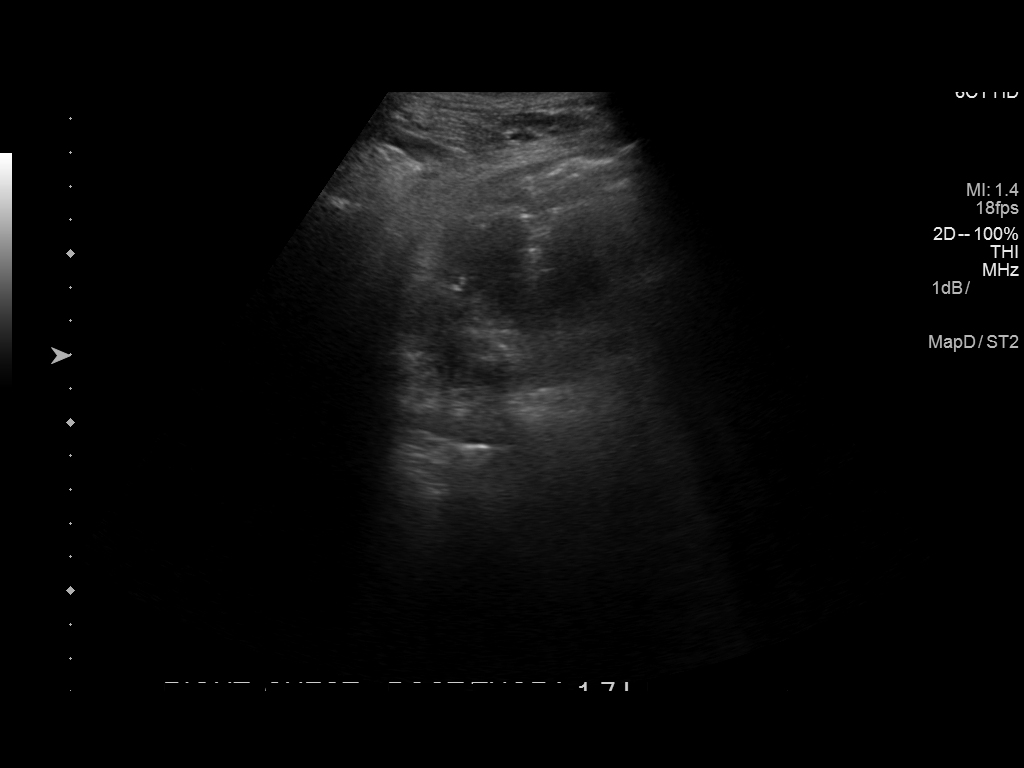
[im 4/4]
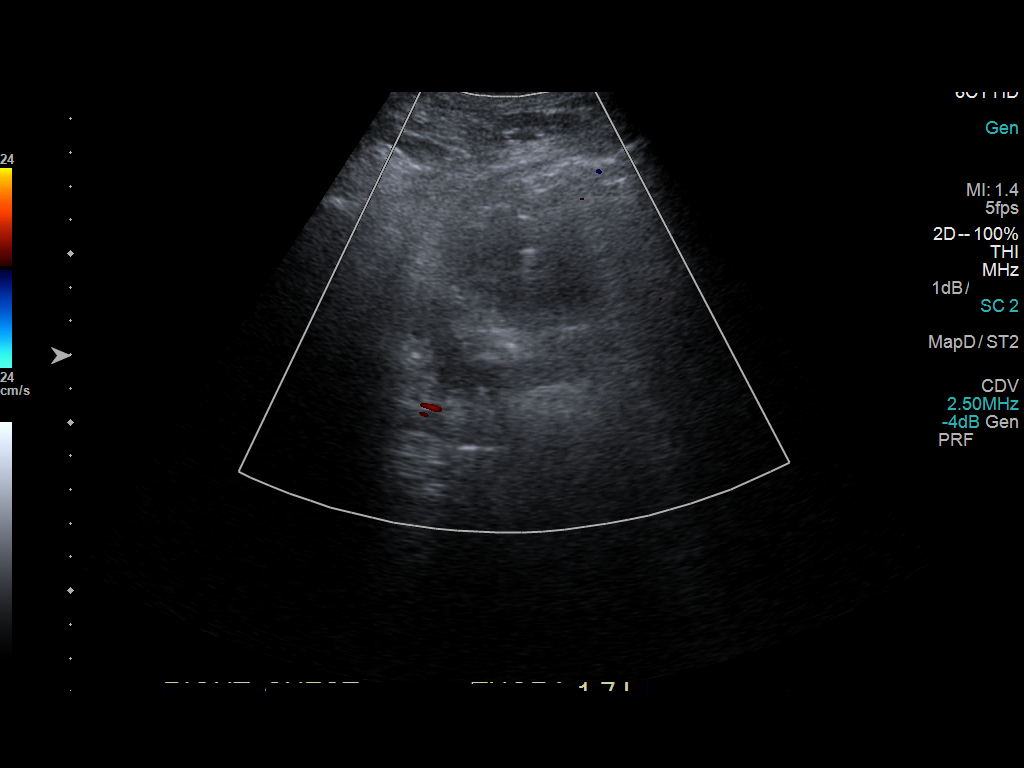

[4 of 4 positions shown; findings below may reference images not displayed]

EXAM:
ULTRASOUND GUIDED THORACENTESIS

MEDICATIONS:
None.

ANESTHESIA/SEDATION:
None

COMPLICATIONS:
None immediate.

PROCEDURE:
An ultrasound guided thoracentesis was thoroughly discussed with the
patient and questions answered. The benefits, risks, alternatives
and complications were also discussed. The patient understands and
wishes to proceed with the procedure. Written consent was obtained.

Ultrasound was performed to localize and mark an adequate pocket of
fluid in the right chest. The area was then prepped and draped in
the normal sterile fashion. 1% Lidocaine was used for local
anesthesia. Under ultrasound guidance a 6 French Safe-T-Centesis
catheter was introduced. Thoracentesis was performed. The catheter
was removed and a dressing applied.
FINDINGS: A total of approximately 1.7 L of clear yellow fluid was removed. A
fluid sample was sent for laboratory analysis.
IMPRESSION: Successful ultrasound guided right thoracentesis yielding 1.7 L of
pleural fluid.

## 2016-09-20 ENCOUNTER — Other Ambulatory Visit: Payer: Self-pay

## 2016-09-20 ENCOUNTER — Emergency Department

## 2016-09-20 ENCOUNTER — Emergency Department
Admission: EM | Admit: 2016-09-20 | Discharge: 2016-09-20 | Disposition: A | Discharge status: Home / Self Care | Attending: Emergency Medicine | Admitting: Emergency Medicine

## 2016-09-20 ENCOUNTER — Telehealth: Payer: Self-pay | Admitting: Cardiothoracic Surgery

## 2016-09-20 ENCOUNTER — Encounter: Payer: Self-pay | Admitting: Emergency Medicine

## 2016-09-20 DIAGNOSIS — I132 Hypertensive heart and chronic kidney disease with heart failure and with stage 5 chronic kidney disease, or end stage renal disease: Secondary | ICD-10-CM | POA: Insufficient documentation

## 2016-09-20 DIAGNOSIS — I509 Heart failure, unspecified: Secondary | ICD-10-CM

## 2016-09-20 DIAGNOSIS — I252 Old myocardial infarction: Secondary | ICD-10-CM | POA: Diagnosis not present

## 2016-09-20 DIAGNOSIS — Z7982 Long term (current) use of aspirin: Secondary | ICD-10-CM | POA: Diagnosis not present

## 2016-09-20 DIAGNOSIS — I5042 Chronic combined systolic (congestive) and diastolic (congestive) heart failure: Secondary | ICD-10-CM | POA: Insufficient documentation

## 2016-09-20 DIAGNOSIS — J90 Pleural effusion, not elsewhere classified: Secondary | ICD-10-CM

## 2016-09-20 DIAGNOSIS — E1122 Type 2 diabetes mellitus with diabetic chronic kidney disease: Secondary | ICD-10-CM | POA: Insufficient documentation

## 2016-09-20 DIAGNOSIS — T82868A Thrombosis of vascular prosthetic devices, implants and grafts, initial encounter: Secondary | ICD-10-CM | POA: Insufficient documentation

## 2016-09-20 DIAGNOSIS — T85698A Other mechanical complication of other specified internal prosthetic devices, implants and grafts, initial encounter: Secondary | ICD-10-CM

## 2016-09-20 DIAGNOSIS — Z992 Dependence on renal dialysis: Secondary | ICD-10-CM | POA: Insufficient documentation

## 2016-09-20 DIAGNOSIS — Z7984 Long term (current) use of oral hypoglycemic drugs: Secondary | ICD-10-CM | POA: Insufficient documentation

## 2016-09-20 DIAGNOSIS — N186 End stage renal disease: Secondary | ICD-10-CM | POA: Insufficient documentation

## 2016-09-20 DIAGNOSIS — R0602 Shortness of breath: Secondary | ICD-10-CM | POA: Diagnosis present

## 2016-09-20 DIAGNOSIS — Y69 Unspecified misadventure during surgical and medical care: Secondary | ICD-10-CM | POA: Diagnosis not present

## 2016-09-20 DIAGNOSIS — Z79899 Other long term (current) drug therapy: Secondary | ICD-10-CM | POA: Insufficient documentation

## 2016-09-20 DIAGNOSIS — I2510 Atherosclerotic heart disease of native coronary artery without angina pectoris: Secondary | ICD-10-CM | POA: Insufficient documentation

## 2016-09-20 MED ORDER — SODIUM CHLORIDE 0.9 % IJ SOLN
Freq: Once | INTRAMUSCULAR | Status: DC
  Filled 2016-09-20: qty 10

## 2016-09-20 MED ORDER — ALTEPLASE 2 MG IJ SOLR
2.0000 mg | Freq: Once | INTRAMUSCULAR | Status: DC
  Filled 2016-09-20 (×2): qty 2

## 2016-09-20 MED ORDER — ALTEPLASE 2 MG IJ SOLR
10.0000 mg | Freq: Once | INTRAMUSCULAR | Status: DC
  Filled 2016-09-20: qty 10

## 2016-09-20 MED ORDER — CARVEDILOL 6.25 MG PO TABS: 6.2500 mg | ORAL_TABLET | Freq: Two times a day (BID) | ORAL | 6 refills | Status: AC

## 2016-09-20 NOTE — Telephone Encounter (Signed)
I will contact Dr. Genevive Bi to let him know that patient's son is calling us to let us know that his father is "drowning in fluid and that we will lose him". I will go ahead and schedule patient's CT Scan without contrast and return appointment to see Dr. Genevive Bi since it has not been scheduled. I told Dr. Genevive Bi about the appointments and he stated that he will let his son know about them. I then told Dr. Genevive Bi to call me after he had spoken to the son so I know what the plan will be.

## 2016-09-20 NOTE — Telephone Encounter (Signed)
Cecilie Lowers called back stating that he was not able to drain anything from his catheter. I told Cecilie Lowers the options that were provided by Dr. Genevive Bi and he was very upset telling me "it's Dr. Genevive Bi responsibility for him to deal with his catheter since he was the one to place, I'm very upset!". I apologized for him feeling this way and told him that it would be best for his dad to take him to the Emergency Room since he would be able to have a chest X-Ray and/or a CT Scan, Chest today. Cecilie Lowers stated that he would take him to the emergency room as soon as we hung up.  I then called Dr. Genevive Bi to let him know that Cecilie Lowers was very upset and that he decided on taking him to the emergency room. Dr. Genevive Bi agreed and stated that there should be a pulmonologist on call to see him while he was at the emergency room as well.

## 2016-09-20 NOTE — ED Provider Notes (Signed)
-----------------------------------------   4:22 PM on 09/20/2016 -----------------------------------------  Strokes was able to drain the pleural effusion, patient has now draining catheter, he is to be discharge per his CT surgeon.   Schuyler Amor, MD 09/20/16 [phone]

## 2016-09-20 NOTE — ED Triage Notes (Signed)
Hospice nurse sent patient to be seen through the ED due to evaluate placement of left chest tube.  Patient was told to page [facility] once patient is here.

## 2016-09-20 NOTE — Discharge Instructions (Signed)
Follow-up closely with her primary care doctor return to the emergency room for any new or worrisome symptoms

## 2016-09-20 NOTE — Telephone Encounter (Signed)
Dr. Genevive Bi called me back and he stated that he was able to speak with patient's son Cecilie Lowers). He instructed Cecilie Lowers to go ahead and drain the catheter. Then to please call us back so we know if he was able to drain anything and how much was drained. Depending on the amount, Dr. Genevive Bi will make the decision if patient will be needing to go to the [facility] for a chest X-ray, see his primary care doctor, or go to Emergency Room. Awaiting on Craig's call.

## 2016-09-20 NOTE — Progress Notes (Signed)
  Patient ID: Donald Henry, male   DOB: 07-01-1930, 82 y.o.   MRN: [id number]  HISTORY: This patient is well known to me.  He called the office stating that he was feeling more short of breath and hospice nurse has been unable to drain his PleurX catheter over the last 2 weeks.  He did have a chest xray from last week showing a small to moderate effusion on that side.  Advised to come to ER.  Otherwise, he is in his usual state of health.     Vitals:   09/20/16 1050  BP: 115/67  Pulse: 67  Resp: 16  Temp: 97.6 F (36.4 C)     EXAM:    Resp: Lungs are clear but decreased on the right.  No respiratory distress, normal effort. Heart:  Regular without murmurs Abd:  Abdomen is soft, non distended and non tender. No masses are palpable.  There is no rebound and no guarding.  Neurological: Alert and oriented to person, place, and time. Coordination normal.  Skin: Skin is warm and dry. No rash noted. No diaphoretic. No erythema. No pallor.  Psychiatric: Normal mood and affect. Normal behavior. Judgment and thought content normal.    ASSESSMENT: I tried to drain the catheter.  Catheter would not drain.  10 mg of TPA instilled and after 2 hours of dwell, drained 750 cc of serosanguinous fluid.  Tolerated well.   PLAN:   He was advised to contact hospice to get back on his usual schedule of drainage.  He was advised to call if there are any further problems.    [name], MD

## 2016-09-20 NOTE — Progress Notes (Signed)
ED visit made. Patient is currently followed by [facility] at home with a  Diagnosis of hypertensive heart and kidney disease. He is a DNR code. He came to the ED today from home for evaluation of a possible obstruction to his Pleurx catheter. Of note his hospice nurse has been attempting to drain the catheter over the past several days and was only been able to get 20 cc out this morning. Chest xray in the ED did not show any displacement of the catheter. Patient seen lying on the ED stretcher, son and daughter in law present. Patient appeared in no distress. Writer spoke with attending physician Dr. Quentin Cornwall regarding family/patient concerns. Dr. Quentin Cornwall to contact Dr. Faith Rogue to discuss the possibility of tPA , patient and family aware and are in agreement with this plan. Update provided to hospice team. Thank you. Flo Shanks RN, BSN, [place] [facility] [place], [facility] [phone] c

## 2016-09-20 NOTE — Telephone Encounter (Signed)
Please call patients son, Cecilie Lowers. Patient sees Dr Genevive Bi and was in the office on 09/16/16. At that time Dr Genevive Bi notes state, "Went over his films with interventional radiology.  They did not think there was sufficient fluid for safe thoracentesis.  They did not recommend replacement of the tunneled catheter.  They did recommend a repeat CT of the chest.  I called the son and explained this to him.  Will schedule and see again next week."  Son is calling today saying that patient is "drowning in fluid and we are going to lose him" An appointment nor CT have been scheduled at this time. Son says that patient also cannot wait to see Dr Genevive Bi on Friday. Please call and advise.

## 2016-09-20 NOTE — ED Provider Notes (Signed)
[facility] Emergency Department Provider Note    First MD Initiated Contact with Patient 09/20/16 1201     (approximate)  I have reviewed the triage vital signs and the nursing notes.   HISTORY  Chief Complaint medical device problem    HPI Donald Henry is a 82 y.o. male with history of end-stage congestive heart failure with Pleurx catheter in the right thoracic cavity for palliative draining who presents the ER due to inadequate draining of fluid. Patient states he is having worsening shortness of breath. He is a DO NOT RESUSCITATE. Denies any pain. No significant weight gain. Denies any chest pain.   Past Medical History:  Diagnosis Date  . BPH (benign prostatic hyperplasia)   . Chronic combined systolic (congestive) and diastolic (congestive) heart failure ([place])    a. 01/2015 Echo: EF 40%, mod MR/PR/TR;  b. 10/2015 Echo: EF 20-25%, sev MR, sev dil LA/RA, sev TR.  . CKD (chronic kidney disease), stage III   . Diabetes mellitus without complication ([place])   . Hiatal hernia   . Hypertensive heart disease   . Ischemic cardiomyopathy   . Myocardial infarction [facility])    a. MI @ age 57-->conservatively managed.  Has never had a cath.  Reports multiple negative stress tests.  . Normocytic anemia   . Obesity   . Peptic ulcer disease   . Pulmonary embolism ([place])    a. 10/2015 ->Eliquis.  . Recurrent right pleural effusion    a. 01/28/2016 1.7L fluid removal via thoracentesis; b. 02/19/2016 2.4L removal via thoracentesis.  . Severe mitral regurgitation    a. 10/2015 Echo: sev MR.  . SSS (sick sinus syndrome) (HCC)    a. s/p MDT ADSRO1 Single Lead PPM.    Patient Active Problem List   Diagnosis Date Noted  . Pneumothorax 07/09/2016  . Chronic combined systolic and diastolic CHF, NYHA class 3 ([place]) 07/09/2016  . Hypertensive heart disease 07/09/2016  . Anemia of chronic disease 07/09/2016  . Pacemaker 07/09/2016  . Preoperative cardiovascular  examination 07/09/2016  . Mitral regurgitation 03/03/2016  . Hyperlipidemia 03/03/2016  . Acute on chronic systolic congestive heart failure ([place]) 03/03/2016  . Secondary cardiomyopathy ([place]) 03/03/2016  . Pleural effusion 02/19/2016  . Pulmonary embolism ([place]) 12/16/2015  . Breathlessness on exertion 12/16/2015  . Acute pulmonary embolism ([place]) 11/18/2015  . Atrial fibrillation ([place]) 11/18/2015  . MI (mitral incompetence) 10/29/2015  . TI (tricuspid incompetence) 10/29/2015  . Benign essential HTN 05/22/2015  . Chronic atrial fibrillation ([place]) 01/14/2015  . Chronic kidney disease (CKD), stage III (moderate) 01/14/2015  . Arteriosclerosis of coronary artery 06/25/2014  . Sick sinus syndrome ([place]) 06/25/2014  . Absolute anemia 06/25/2014  . Diabetes mellitus ([place]) 06/25/2014    Past Surgical History:  Procedure Laterality Date  . CHEST TUBE INSERTION N/A 07/12/2016   Procedure: INSERTION PLEURAL DRAINAGE CATHETER;  Surgeon: [name], MD;  Location: [facility] ORS;  Service: Thoracic;  Laterality: N/A;  . INSERT / REPLACE / REMOVE PACEMAKER  04/19/2014   [employer]  Serial # [id number]    Prior to Admission medications   Medication Sig Start Date End Date Taking? Authorizing Provider  aspirin 81 MG tablet Take 81 mg by mouth daily.    Historical Provider, MD  carvedilol (COREG) 6.25 MG tablet Take 1 tablet (6.25 mg total) by mouth 2 (two) times daily. 03/22/16   Wende Bushy, MD  glipiZIDE (GLUCOTROL) 10 MG tablet Take 10 mg by mouth daily before breakfast.  Historical Provider, MD  isosorbide mononitrate (IMDUR) 30 MG 24 hr tablet Take 30 mg by mouth daily.    Historical Provider, MD  LORazepam (ATIVAN) 0.5 MG tablet Take 0.5 mg by mouth every 8 (eight) hours as needed for anxiety.    Historical Provider, MD  torsemide (DEMADEX) 20 MG tablet Take 1 tablet (20 mg total) by mouth daily. 04/20/16   Wende Bushy, MD  vitamin B-12 (CYANOCOBALAMIN) 1000 MCG tablet Take 1,000  mcg by mouth daily.    Historical Provider, MD    Allergies Atorvastatin and Aspirin  Family History  Problem Relation Age of Onset  . Heart disease Mother   . Heart disease Father     a. First MI @ 11.  . Leukemia Sister   . Diabetes Mellitus II Sister   . Diabetes Mellitus II Brother   . Heart attack Son     a. MI @ 37  . Heart attack Son     a. MI @ 43.    Social History Social History  Substance Use Topics  . Smoking status: Never Smoker  . Smokeless tobacco: Never Used  . Alcohol use No    Review of Systems Patient denies headaches, rhinorrhea, blurry vision, numbness, shortness of breath, chest pain, edema, cough, abdominal pain, nausea, vomiting, diarrhea, dysuria, fevers, rashes or hallucinations unless otherwise stated above in HPI. ____________________________________________   PHYSICAL EXAM:  VITAL SIGNS: Vitals:   09/20/16 1050  BP: 115/67  Pulse: 67  Resp: 16  Temp: 97.6 F (36.4 C)    Constitutional: Alert and oriented. Chronically ill appearing. Eyes: Conjunctivae are normal. PERRL. EOMI. Head: Atraumatic. Nose: No congestion/rhinnorhea. Mouth/Throat: Mucous membranes are moist.  Oropharynx non-erythematous. Neck: No stridor. Painless ROM. No cervical spine tenderness to palpation Hematological/Lymphatic/Immunilogical: No cervical lymphadenopathy. Cardiovascular: Normal rate, regular rhythm. Grossly normal heart sounds.  Good peripheral circulation. Respiratory: Normal respiratory effort.  diminished breath sounds in right base. Pleurex catheter is in place. Gastrointestinal: Soft and nontender. No distention. No abdominal bruits. No CVA tenderness. Genitourinary:  Musculoskeletal: No lower extremity tenderness nor edema.  No joint effusions. Neurologic:  Normal speech and language. No gross focal neurologic deficits are appreciated. No gait instability. Skin:  Skin is warm, dry and intact. No rash noted. Psychiatric: Mood and affect are  normal. Speech and behavior are normal.  ____________________________________________   LABS (all labs ordered are listed, but only abnormal results are displayed)  No results found for this or any previous visit (from the past 24 hour(s)). ____________________________________________   ____________________________________________  RADIOLOGY CXR IMPRESSION: 1. No interval change. 2. RIGHT chest tube in place with RIGHT pleural effusion.  ____________________________________________   PROCEDURES  Procedure(s) performed: none    Critical Care performed: no ____________________________________________   INITIAL IMPRESSION / ASSESSMENT AND PLAN / ED COURSE  Pertinent labs & imaging results that were available during my care of the patient were reviewed by me and considered in my medical decision making (see chart for details).  DDX: Called catheter, pneumothorax, pleural effusion  ORYAN MABEN is a 82 y.o. who presents to the ED with chronic pleural effusion and CHF with indwelling pleurex catheter in place presents 2/2 clogged chest tube.  Patient is AFVSS in ED. Exam as above. Given current presentation have considered the above differential.  CXR shows no displacement or worsening.  Clinical Course  Comment By Time  Have paged Dr. Faith Rogue. Currently awaiting callback Merlyn Lot, MD 09/25 1243  Dr. Faith Rogue currently at bedside with patient.  Merlyn Lot, MD 09/25 1259  Spoke with Dr. Faith Rogue regarding presentation and we will proceed with attempt to declog Pleurx catheter with indwelling TPA. Merlyn Lot, MD 09/25 1313  Currently receiving tPA infusion.  Patient will be signed out to Dr. Burlene Arnt pending clinical evaluation after tPA infusion.  Have discussed with the patient and available family all diagnostics and treatments performed thus far and all questions were answered to the best of my ability. The patient demonstrates understanding and agreement  with plan.  Merlyn Lot, MD 09/25 1443     ____________________________________________   FINAL CLINICAL IMPRESSION(S) / ED DIAGNOSES  Final diagnoses:  Chronic congestive heart failure, unspecified congestive heart failure type (HCC)  Pleural effusion on right  Clotted chest tube, initial encounter      NEW MEDICATIONS STARTED DURING THIS VISIT:  New Prescriptions   No medications on file     Note:  This document was prepared using Dragon voice recognition software and may include unintentional dictation errors.    Merlyn Lot, MD 09/20/16 1447

## 2016-09-21 ENCOUNTER — Other Ambulatory Visit: Payer: Self-pay | Admitting: Cardiothoracic Surgery

## 2016-09-21 DIAGNOSIS — J90 Pleural effusion, not elsewhere classified: Secondary | ICD-10-CM

## 2016-09-23 ENCOUNTER — Other Ambulatory Visit: Payer: Self-pay

## 2016-09-23 DIAGNOSIS — J90 Pleural effusion, not elsewhere classified: Secondary | ICD-10-CM

## 2016-09-24 ENCOUNTER — Ambulatory Visit
Admission: RE | Admit: 2016-09-24 | Discharge: 2016-09-24 | Disposition: A | Discharge status: Home / Self Care | Source: Ambulatory Visit | Attending: Cardiothoracic Surgery | Admitting: Cardiothoracic Surgery

## 2016-09-24 ENCOUNTER — Ambulatory Visit (INDEPENDENT_AMBULATORY_CARE_PROVIDER_SITE_OTHER): Admitting: Cardiothoracic Surgery

## 2016-09-24 ENCOUNTER — Encounter: Payer: Self-pay | Admitting: Cardiothoracic Surgery

## 2016-09-24 ENCOUNTER — Encounter: Payer: Medicare Other | Admitting: Cardiothoracic Surgery

## 2016-09-24 ENCOUNTER — Ambulatory Visit

## 2016-09-24 DIAGNOSIS — J9811 Atelectasis: Secondary | ICD-10-CM | POA: Diagnosis not present

## 2016-09-24 DIAGNOSIS — I700 Atherosclerosis of aorta: Secondary | ICD-10-CM | POA: Diagnosis not present

## 2016-09-24 DIAGNOSIS — J90 Pleural effusion, not elsewhere classified: Secondary | ICD-10-CM

## 2016-09-24 DIAGNOSIS — I255 Ischemic cardiomyopathy: Secondary | ICD-10-CM

## 2016-09-24 NOTE — Progress Notes (Signed)
  Patient ID: Donald Henry, male   DOB: 1930-01-20, 82 y.o.   MRN: [id number]  HISTORY: He was seen earlier this week in our emergency department where TPA was inserted through his Pleurx catheter. We drained a proximally 750 cc. Yesterday he had a another 1000 cc removed from the catheter and today I removed another 750. He states that he feels better. He's less short of breath. No fever.   There were no vitals filed for this visit.   EXAM:    Resp: Lungs are clear on the left but diminished on the right..  No respiratory distress, normal effort. Heart:  Regular without murmurs Neurological: Alert and oriented to person, place, and time. Coordination normal.  Skin: Skin is warm and dry. No rash noted. No diaphoretic. No erythema. No pallor.  Psychiatric: Normal mood and affect. Normal behavior. Judgment and thought content normal.   The skin entrance site is clean dry and intact. There is a slight amount of erythema surrounding it but no drainage.  ASSESSMENT: I have independently reviewed the patient's chest x-ray. There is a right-sided pleural effusion. We drained 750 cc of serous sanguinous fluid from the right chest.   PLAN:   I explained to the patient and his family that since he had 1000 cc drained yesterday and 750 today that perhaps he should drain the catheter on a daily basis. He'll work with hospice and Dr. Raul Del with this. We will see him back as needed.    [name], MD

## 2016-09-24 NOTE — Patient Instructions (Signed)
Please call our office if you have questions or concerns.   

## 2016-09-28 ENCOUNTER — Encounter: Payer: Self-pay | Admitting: Diagnostic Radiology

## 2016-10-04 ENCOUNTER — Emergency Department

## 2016-10-04 ENCOUNTER — Emergency Department
Admission: EM | Admit: 2016-10-04 | Discharge: 2016-10-04 | Disposition: A | Discharge status: Home / Self Care | Attending: Emergency Medicine | Admitting: Emergency Medicine

## 2016-10-04 ENCOUNTER — Telehealth: Payer: Self-pay | Admitting: Cardiothoracic Surgery

## 2016-10-04 ENCOUNTER — Encounter: Payer: Self-pay | Admitting: Emergency Medicine

## 2016-10-04 DIAGNOSIS — N183 Chronic kidney disease, stage 3 (moderate): Secondary | ICD-10-CM | POA: Diagnosis not present

## 2016-10-04 DIAGNOSIS — W06XXXA Fall from bed, initial encounter: Secondary | ICD-10-CM | POA: Insufficient documentation

## 2016-10-04 DIAGNOSIS — C716 Malignant neoplasm of cerebellum: Secondary | ICD-10-CM | POA: Diagnosis not present

## 2016-10-04 DIAGNOSIS — J90 Pleural effusion, not elsewhere classified: Secondary | ICD-10-CM | POA: Insufficient documentation

## 2016-10-04 DIAGNOSIS — Z79899 Other long term (current) drug therapy: Secondary | ICD-10-CM | POA: Insufficient documentation

## 2016-10-04 DIAGNOSIS — Y929 Unspecified place or not applicable: Secondary | ICD-10-CM | POA: Insufficient documentation

## 2016-10-04 DIAGNOSIS — E1122 Type 2 diabetes mellitus with diabetic chronic kidney disease: Secondary | ICD-10-CM | POA: Diagnosis not present

## 2016-10-04 DIAGNOSIS — Y939 Activity, unspecified: Secondary | ICD-10-CM | POA: Insufficient documentation

## 2016-10-04 DIAGNOSIS — I5042 Chronic combined systolic (congestive) and diastolic (congestive) heart failure: Secondary | ICD-10-CM | POA: Diagnosis not present

## 2016-10-04 DIAGNOSIS — Y999 Unspecified external cause status: Secondary | ICD-10-CM | POA: Diagnosis not present

## 2016-10-04 DIAGNOSIS — I130 Hypertensive heart and chronic kidney disease with heart failure and stage 1 through stage 4 chronic kidney disease, or unspecified chronic kidney disease: Secondary | ICD-10-CM | POA: Insufficient documentation

## 2016-10-04 DIAGNOSIS — R0602 Shortness of breath: Secondary | ICD-10-CM | POA: Diagnosis present

## 2016-10-04 DIAGNOSIS — J918 Pleural effusion in other conditions classified elsewhere: Secondary | ICD-10-CM | POA: Diagnosis not present

## 2016-10-04 DIAGNOSIS — S51011A Laceration without foreign body of right elbow, initial encounter: Secondary | ICD-10-CM | POA: Insufficient documentation

## 2016-10-04 LAB — COMPREHENSIVE METABOLIC PANEL
ALK PHOS: 144 U/L — AB (ref 38–126)
ALT: 11 U/L — AB (ref 17–63)
ANION GAP: 7 (ref 5–15)
AST: 20 U/L (ref 15–41)
Albumin: 3 g/dL — ABNORMAL LOW (ref 3.5–5.0)
BILIRUBIN TOTAL: 2.1 mg/dL — AB (ref 0.3–1.2)
BUN: 42 mg/dL — ABNORMAL HIGH (ref 6–20)
CALCIUM: 8.7 mg/dL — AB (ref 8.9–10.3)
CO2: 30 mmol/L (ref 22–32)
CREATININE: 1.89 mg/dL — AB (ref 0.61–1.24)
Chloride: 98 mmol/L — ABNORMAL LOW (ref 101–111)
GFR calc non Af Amer: 31 mL/min — ABNORMAL LOW (ref >60)
GFR, EST AFRICAN AMERICAN: 36 mL/min — AB (ref >60)
GLUCOSE: 87 mg/dL (ref 65–99)
Potassium: 5.3 mmol/L — ABNORMAL HIGH (ref 3.5–5.1)
Sodium: 135 mmol/L (ref 135–145)
TOTAL PROTEIN: 6.9 g/dL (ref 6.5–8.1)

## 2016-10-04 LAB — CBC WITH DIFFERENTIAL/PLATELET
Basophils Absolute: 0.1 10*3/uL (ref 0–0.1)
Basophils Relative: 2 %
EOS PCT: 3 %
Eosinophils Absolute: 0.2 10*3/uL (ref 0–0.7)
HCT: 26.8 % — ABNORMAL LOW (ref 40.0–52.0)
Hemoglobin: 9.1 g/dL — ABNORMAL LOW (ref 13.0–18.0)
LYMPHS ABS: 0.7 10*3/uL — AB (ref 1.0–3.6)
LYMPHS PCT: 10 %
MCH: 31.1 pg (ref 26.0–34.0)
MCHC: 34 g/dL (ref 32.0–36.0)
MCV: 91.5 fL (ref 80.0–100.0)
MONO ABS: 0.4 10*3/uL (ref 0.2–1.0)
MONOS PCT: 7 %
Neutro Abs: 5.1 10*3/uL (ref 1.4–6.5)
Neutrophils Relative %: 78 %
PLATELETS: 248 10*3/uL (ref 150–440)
RBC: 2.93 MIL/uL — ABNORMAL LOW (ref 4.40–5.90)
RDW: 21.2 % — AB (ref 11.5–14.5)
WBC: 6.6 10*3/uL (ref 3.8–10.6)

## 2016-10-04 LAB — CK: Total CK: 26 U/L — ABNORMAL LOW (ref 49–397)

## 2016-10-04 LAB — PROTIME-INR
INR: 1.52
Prothrombin Time: 18.5 seconds — ABNORMAL HIGH (ref 11.4–15.2)

## 2016-10-04 LAB — TROPONIN I: Troponin I: 0.03 ng/mL (ref <0.03)

## 2016-10-04 MED ORDER — ALTEPLASE 100 MG IV SOLR
INTRAVENOUS | Status: AC
  Filled 2016-10-04: qty 100

## 2016-10-04 MED ORDER — SODIUM CHLORIDE 0.9 % IJ SOLN
Freq: Once | INTRAMUSCULAR | Status: AC
  Administered 2016-10-04: 13:00:00 via INTRAPLEURAL
  Filled 2016-10-04: qty 10

## 2016-10-04 NOTE — Progress Notes (Signed)
ED visit made. Patient is currently followed by Hospice and [place] at home with a  Hospice diagnosis of hypertensive heart and kidney disease. He is a DNR code. He came to the ED this morning following and unwitnessed fall from his bed. Patient seen lying in bed, alert, back from CT, then out for chest xray. Patient's sons Cecilie Lowers and Quita Skye at bedside. Discussed events of the weekend. Cecilie Lowers reported that Mr. Langlitz was able to go out to a eat on Friday, but had a difficult day on Saturday, with decreased oxygen saturations.and some shortness of breath. He was seen last evening by the hospice nurse, he had c/o shortness and of breath and did receive a dose of liquid morphine for this. He also c/o increased weakness and fatigue. Patient was able to recall waking up on the floor sometime in the night, he stated he covered himself up and went back to sleep. Family also expressed concerns that the drainage from his pleurx tube is not draining well again. Cecilie Lowers reports that he has spoken to Dr. Genevive Bi this morning. ED attending Dr. Burlene Arnt made aware of writer's visit and that Mr. Bodle is followed by hospice at home. He will review the chest xray and speak with family. Head CT negative for any acute injuries. Writer to follow through final disposition and update hospice team. Flo Shanks RN, BSN, [place] [facility], [facility] [phone] c

## 2016-10-04 NOTE — ED Provider Notes (Addendum)
[facility] Emergency Department Provider Note  ____________________________________________   I have reviewed the triage vital signs and the nursing notes.   HISTORY  Chief Complaint Fall    HPI Donald Henry is a 82 y.o. male with history of end-stage congestive heart failure with Pleurx catheter in the right thoracic cavity for palliative draining who presents the ER due to inadequate draining of fluid. Patient states he is having worsening shortness of breath. He is a DO NOT RESUSCITATE. Denies any pain. No significant weight gain. Denies any chest painpatient states he has not had any drainage of the fluid for the last 3 days. He states he is getting mildly more short of breath. He also states he woke up this morning on the floor. His number falling out of bed. Does not alleviate his head. Aside from a minor skin tear on his right elbow, he denies any injury from the fall he has no headache neck pain or hip pain. Patient was here for similar presentation not long ago, at that time, Dr. Faith Rogue cardiothoracic surgeon was able to unplug the tube.   ----------------------------------------- 9:51 AM on 10/04/2016 -----------------------------------------  Have been made aware that the patient is a hospice patient, hospice nurses at bedside. Workup was otherwise unremarkable for evidence of significant injury from the fall. This is chest x-ray is read we will see if he can drain the tube, if we can, we will discharge the patient if not, we'll discussed with cardiothoracic surgery.      Past Medical History:  Diagnosis Date  . BPH (benign prostatic hyperplasia)   . Chronic combined systolic (congestive) and diastolic (congestive) heart failure    a. 01/2015 Echo: EF 40%, mod MR/PR/TR;  b. 10/2015 Echo: EF 20-25%, sev MR, sev dil LA/RA, sev TR.  . CKD (chronic kidney disease), stage III   . Diabetes mellitus without complication ([place])   . Hiatal hernia   .  Hypertensive heart disease   . Ischemic cardiomyopathy   . Myocardial infarction    a. MI @ age 64-->conservatively managed.  Has never had a cath.  Reports multiple negative stress tests.  . Normocytic anemia   . Obesity   . Peptic ulcer disease   . Pulmonary embolism ([place])    a. 10/2015 ->Eliquis.  . Recurrent right pleural effusion    a. 01/28/2016 1.7L fluid removal via thoracentesis; b. 02/19/2016 2.4L removal via thoracentesis.  . Severe mitral regurgitation    a. 10/2015 Echo: sev MR.  . SSS (sick sinus syndrome) (HCC)    a. s/p MDT ADSRO1 Single Lead PPM.    Patient Active Problem List   Diagnosis Date Noted  . Pneumothorax 07/09/2016  . Chronic combined systolic and diastolic CHF, NYHA class 3 ([place]) 07/09/2016  . Hypertensive heart disease 07/09/2016  . Anemia of chronic disease 07/09/2016  . Pacemaker 07/09/2016  . Preoperative cardiovascular examination 07/09/2016  . Mitral regurgitation 03/03/2016  . Hyperlipidemia 03/03/2016  . Acute on chronic systolic congestive heart failure ([place]) 03/03/2016  . Secondary cardiomyopathy ([place]) 03/03/2016  . Pleural effusion 02/19/2016  . Pulmonary embolism ([place]) 12/16/2015  . Breathlessness on exertion 12/16/2015  . Acute pulmonary embolism ([place]) 11/18/2015  . Atrial fibrillation ([place]) 11/18/2015  . MI (mitral incompetence) 10/29/2015  . TI (tricuspid incompetence) 10/29/2015  . Benign essential HTN 05/22/2015  . Chronic atrial fibrillation ([place]) 01/14/2015  . Chronic kidney disease (CKD), stage III (moderate) 01/14/2015  . Arteriosclerosis of coronary artery 06/25/2014  . Sick sinus  syndrome ([place]) 06/25/2014  . Absolute anemia 06/25/2014  . Diabetes mellitus ([place]) 06/25/2014    Past Surgical History:  Procedure Laterality Date  . CHEST TUBE INSERTION N/A 07/12/2016   Procedure: INSERTION PLEURAL DRAINAGE CATHETER;  Surgeon: [name], MD;  Location: [facility] ORS;  Service: Thoracic;  Laterality: N/A;  . INSERT / REPLACE /  REMOVE PACEMAKER  04/19/2014   [employer]  Serial # [id number]  . IR GENERIC HISTORICAL  04/28/2016   IR RADIOLOGIST EVAL & MGMT 04/28/2016 Markus Daft, MD [facility] INTERV RAD    Prior to Admission medications   Medication Sig Start Date End Date Taking? Authorizing Provider  aspirin 81 MG tablet Take 81 mg by mouth daily.    Historical Provider, MD  carvedilol (COREG) 6.25 MG tablet Take 1 tablet (6.25 mg total) by mouth 2 (two) times daily. 09/20/16   Wende Bushy, MD  glipiZIDE (GLUCOTROL) 10 MG tablet Take 10 mg by mouth daily before breakfast.    Historical Provider, MD  isosorbide mononitrate (IMDUR) 30 MG 24 hr tablet Take 30 mg by mouth daily.    Historical Provider, MD  LORazepam (ATIVAN) 0.5 MG tablet Take 0.5 mg by mouth every 8 (eight) hours as needed for anxiety.    Historical Provider, MD  torsemide (DEMADEX) 20 MG tablet Take 1 tablet (20 mg total) by mouth daily. 04/20/16   Wende Bushy, MD  vitamin B-12 (CYANOCOBALAMIN) 1000 MCG tablet Take 1,000 mcg by mouth daily.    Historical Provider, MD    Allergies Atorvastatin and Aspirin  Family History  Problem Relation Age of Onset  . Heart disease Mother   . Heart disease Father     a. First MI @ 80.  . Leukemia Sister   . Diabetes Mellitus II Sister   . Diabetes Mellitus II Brother   . Heart attack Son     a. MI @ 75  . Heart attack Son     a. MI @ 13.    Social History Social History  Substance Use Topics  . Smoking status: Never Smoker  . Smokeless tobacco: Never Used  . Alcohol use No    Review of Systems Constitutional: No fever/chills Eyes: No visual changes. ENT: No sore throat. No stiff neck no neck pain Cardiovascular: Denies chest pain. Respiratory: As above mild shortness of breath. Gastrointestinal:   no vomiting.  No diarrhea.  No constipation. Genitourinary: Negative for dysuria. Musculoskeletal: Negative lower extremity swelling Skin: Negative for rash. Neurological: Negative for severe  headaches, focal weakness or numbness. 10-point ROS otherwise negative.  ____________________________________________   PHYSICAL EXAM:  VITAL SIGNS: ED Triage Vitals  Enc Vitals Group     BP 10/04/16 0846 123/78     Pulse Rate 10/04/16 0846 74     Resp 10/04/16 0846 12     Temp 10/04/16 0846 97.9 F (36.6 C)     Temp Source 10/04/16 0846 Oral     SpO2 10/04/16 0846 95 %     Weight 10/04/16 0848 167 lb (75.8 kg)     Height 10/04/16 0848 5\' 7"  (1.702 m)     Head Circumference --      Peak Flow --      Pain Score 10/04/16 0848 0     Pain Loc --      Pain Edu? --      Excl. in [place]? --     Constitutional: Alert and oriented. Well appearing and in no acute distress. Eyes: Conjunctivae are normal. PERRL.  EOMI. Head: Atraumatic. Nose: No congestion/rhinnorhea. Mouth/Throat: Mucous membranes are moist.  Oropharynx non-erythematous. Neck: No stridor.   Nontender with no meningismus Cardiovascular: Normal rate, regular rhythm. Grossly normal heart sounds.  Good peripheral circulation. Respiratory: Normal respiratory effort.  No retractions. Medicine the bases, chest tube present on the right with no obvious drainage.  Abdominal: Soft and nontender. No distention. No guarding no rebound Back:  There is no focal tenderness or step off.  there is no midline tenderness there are no lesions noted. there is no CVA tenderness Musculoskeletal: No lower extremity tenderness, no upper extremity tenderness. No joint effusions, no DVT signs strong distal pulses no edema Neurologic:  Normal speech and language. No gross focal neurologic deficits are appreciated.  Skin:  Skin is warm, dry and intact. Superficial skin tears noted to the right elbow with no evidence of bony tenderness Psychiatric: Mood and affect are normal. Speech and behavior are normal.  ____________________________________________   LABS (all labs ordered are listed, but only abnormal results are displayed)  Labs Reviewed   CBC WITH DIFFERENTIAL/PLATELET  PROTIME-INR  COMPREHENSIVE METABOLIC PANEL  TROPONIN I  CK   ____________________________________________  EKG  I personally interpreted any EKGs ordered by me or triage Ventricular paced rhythm rate 62 bpm ____________________________________________  RADIOLOGY  I reviewed any imaging ordered by me or triage that were performed during my shift and, if possible, patient and/or family made aware of any abnormal findings. ____________________________________________   PROCEDURES  Procedure(s) performed: None  Procedures  Critical Care performed: None  ____________________________________________   INITIAL IMPRESSION / ASSESSMENT AND PLAN / ED COURSE  Pertinent labs & imaging results that were available during my care of the patient were reviewed by me and considered in my medical decision making (see chart for details).  Patient with a non-syncopal fall this morning, rolled out of bed according to his history. Given his age we will obtain CT of his head although I do not detect any significant head injury, patient also complained of no drainage from his chest tube for the last few days. We'll get a chest x-ray, check basic blood work initially a CK conceded sleep on the floor he states after he fell, and we will then discussed with cardiothoracic surgery with their wishes [place] to do for further management of his chronic indwelling chest tube. Patient otherwise well appearing. Sats are in the mid 90s.  ----------------------------------------  ----------------------------------------- 11:08 AM on 10/04/2016 -----------------------------------------  Have paged dr. Genevive Bi.   ----------------------------------------- 2:01 PM on 10/04/2016 -----------------------------------------  Discussed with Dr. Raul Del, we discussed the patient's chest x-ray and his worsening condition, he knows the patient well, agrees with no antibiotic management and  outpatient follow-up. Discussed with Dr. Gilford Rile, we also discussed all these considerations as well as his status as a patient on anticoagulation despite frequent falls and they will readdress this as an outpatient. For now, they wish to keep him on that medication. Dr. Genevive Bi, cardiothoracic surgery is here in the emergency room trying to see if he can release the obstruction to the chest catheter. Patient is in no acute distress eating cheeseburgers. Certainly no clinical evidence of pneumonia.  ----------------------------------------- 3:40 PM on 10/04/2016 -----------------------------------------  Signed out to dr. Joni Fears at the end of my shift.  Dr. Genevive Bi will inform us of when he is cleared for d.c    Clinical Course   ____________________________________________   FINAL CLINICAL IMPRESSION(S) / ED DIAGNOSES  Final diagnoses:  None      This chart  was dictated using voice recognition software.  Despite best efforts to proofread,  errors can occur which can change meaning.      Schuyler Amor, MD 10/04/16 [id number]    Schuyler Amor, MD 10/04/16 [id number]    Schuyler Amor, MD 10/04/16 1108    Schuyler Amor, MD 10/04/16 [place], MD 10/04/16 1540

## 2016-10-04 NOTE — Progress Notes (Signed)
Emergency Room Note  I was asked to see this patient at the request of the ER physicians for management of a loculated right pleural effusion.  This patient is well known to me. He is an 82 year old gentleman with a recurrent right pleural effusion status post insertion of Pleurx catheter. He is currently on hospice care. He's had problems with the catheter clotting and has been seen on multiple occasions for intrapleural thrombolytics and catheter thrombolytics. He was in his usual state of health until the last week when the hospice nurse again noticed difficulty and draining his pleural effusion. Only a small amount could be withdrawn. He became more short of breath and last evening had a dose of morphine to help with his symptoms of shortness of breath and anxiety. Apparently the patient fell out of bed and was discovered this morning at the side of his bed and covers. The patient states he does not remember falling. He came to the emergency room where a head CT scan was obtained which was negative for any intracranial pathology. The chest x-ray showed a loculated right-sided pleural effusion. I have independently reviewed the patient's chest x-ray and CT scan. I see no evidence of intracerebral bleed.  The patient states he does feel quite short of breath. He has been unable to drain any significant volume from his Pleurx catheter over the last week or so. He would like something done.  He was accompanied today by his 2 sons and a daughter-in-law. I explained to them in great detail again the options. We could use intrapleural thrombolytics.  The patient and family are aware that any recent trauma is a relative contraindication to use of intrapleural thrombolytics. I explained this to them. They understand the risks and would like [place] to proceed. I also reviewed with them the options of removing the catheter and allowing the natural process to take place. We also discussed the option of replacing the  catheter to a different location with perhaps better results. After an extensive discussion with the family and with the patient they would like [place] to proceed with intrapleural thrombolytics.  Today on exam he is a well developed well-nourished gentleman in no distress. He had a Pleurx catheter in place on the right side. The insertion site was clean and dry. There is no erythema or drainage. His lungs show diminished breath sounds on the right. His heart is regular. The left lung was clear. Did have some superficial skin tears along the right elbow. There is no evidence of external trauma to the cranium.  After reviewing the head CT and the chest x-ray I instilled 10 mg a TPA into the Pleurx catheter using sterile technique. We waited approximately 3 hours and then withdrew 750 mL's of serosanguineous fluid. This again was performed under sterile technique and the catheter was redressed. A chest x-ray was obtained afterwards which have independently reviewed and found the pleural space to be much improved. There remains some chronic pleural changes. There was no evidence of a pneumothorax.  I again talked to the family and the patient told him that I would be happy to replace the catheter in the future if that is what they would desire. I would also be willing to remove the catheter if they choose to do that as well. I did not make a return visit for but would be happy to see him should the need arise. I did counsel them on draining the catheter on a daily basis to help prevent  any clot formation from obstructing the catheter. This may or may not work but this has not yet been tried.

## 2016-10-04 NOTE — Telephone Encounter (Signed)
Patient's son has called and stated that he would like to talk with Dr Genevive Bi directly in reference to the patient being taken by EMS to [facility] ED due to patient's chest tube. He stated that the patient has a significant amount of fluid buildup. I have contacted Dr Genevive Bi and Dr Genevive Bi was given the son's phone number, listed in the chart. Per Dr Genevive Bi, he stated that he would like to contact the son and speak with him in regards to the patient. Patient's son, Cecilie Lowers, was advised that Dr Genevive Bi will be calling him directly.

## 2016-10-04 NOTE — ED Notes (Signed)
Dr. Burlene Arnt notified of troponin 0.03.

## 2016-10-04 NOTE — ED Notes (Signed)
Patient transported to CT 

## 2016-10-04 NOTE — ED Triage Notes (Signed)
Pt arrived via EMS from home for reports of falling out of bed. Pt states he does not remember falling out of bed. Pt states he woke up on the floor. Pt reports his chest tube is clogged. Pt has skin tears to right arm. EMS reports VSS, CBG 111.

## 2016-10-12 ENCOUNTER — Other Ambulatory Visit: Payer: Self-pay | Admitting: Specialist

## 2016-10-12 DIAGNOSIS — J90 Pleural effusion, not elsewhere classified: Secondary | ICD-10-CM

## 2016-10-13 ENCOUNTER — Ambulatory Visit
Admission: RE | Admit: 2016-10-13 | Discharge: 2016-10-13 | Disposition: A | Discharge status: Home / Self Care | Source: Ambulatory Visit | Attending: Specialist | Admitting: Specialist

## 2016-10-13 ENCOUNTER — Other Ambulatory Visit: Payer: Self-pay | Admitting: Specialist

## 2016-10-13 DIAGNOSIS — J90 Pleural effusion, not elsewhere classified: Secondary | ICD-10-CM | POA: Diagnosis not present

## 2016-10-14 ENCOUNTER — Telehealth: Payer: Self-pay

## 2016-10-14 DIAGNOSIS — J90 Pleural effusion, not elsewhere classified: Secondary | ICD-10-CM

## 2016-10-14 NOTE — Telephone Encounter (Signed)
Stephanie-RN from Hospice called stating that patient is scheduled to have a thoracentesis every Wednesday ordered by Dr. Raul Del. She stated that she took him to do his thoracentesis and not much of anything drained out. It seemed to be clogged up again. She also stated that last night she found patient with a pair of scissors trying to cut off his pleurx catheter. [name] wants to know if we could remove it.  I told her that I would ask Dr. Genevive Bi tomorrow and then I would call her with his recommendations. She agreed.

## 2016-10-15 NOTE — Telephone Encounter (Signed)
Called [place] and had to leave a voicemail.  I spoke with Dr. Genevive Bi and he wants to see the patient next week on Friday. Awaiting on Stephanie to call me back.

## 2016-10-15 NOTE — Telephone Encounter (Signed)
[name] called me back and I told her that Dr. Genevive Bi wants to see the patient next week on Friday to discuss what needs to be done. I gave [name] the appointment date and time to give patient's son. I also told her that he needed a chest x-ray prior to his appointment.

## 2016-10-19 NOTE — Discharge Instructions (Signed)
Thoracentesis, Care After °Refer to this sheet in the next few weeks. These instructions provide you with information about caring for yourself after your procedure. Your health care provider may also give you more specific instructions. Your treatment has been planned according to current medical practices, but problems sometimes occur. Call your health care provider if you have any problems or questions after your procedure. °WHAT TO EXPECT AFTER THE PROCEDURE °After your procedure, it is common to have pain at the puncture site. °HOME CARE INSTRUCTIONS °· Take medicines only as directed by your health care provider. °· You may return to your normal diet and normal activities as directed by your health care provider. °· Drink enough fluid to keep your urine clear or pale yellow. °· Do not take baths, swim, or use a hot tub until your health care provider approves. °· Follow your health care provider's instructions about: °¨ Puncture site care. °¨ Bandage (dressing) changes and removal. °· Check your puncture site every day for signs of infection. Watch for: °¨ Redness, swelling, or pain. °¨ Fluid, blood, or pus. °· Keep all follow-up visits as directed by your health care provider. This is important. °SEEK MEDICAL CARE IF: °· You have redness, swelling, or pain at your puncture site. °· You have fluid, blood, or pus coming from your puncture site. °· You have a fever. °· You have chills. °· You have nausea or vomiting. °· You have trouble breathing. °· You develop a worsening cough. °SEEK IMMEDIATE MEDICAL CARE IF: °· You have extreme shortness of breath. °· You develop chest pain. °· You faint or feel light-headed. °  °This information is not intended to replace advice given to you by your health care provider. Make sure you discuss any questions you have with your health care provider. °  °Document Released: 01/03/2015 Document Reviewed: 01/03/2015 °Elsevier Interactive Patient Education ©2016 [employer] ° °

## 2016-10-20 ENCOUNTER — Ambulatory Visit
Admission: RE | Admit: 2016-10-20 | Discharge: 2016-10-20 | Disposition: A | Discharge status: Home / Self Care | Source: Ambulatory Visit | Attending: Specialist | Admitting: Specialist

## 2016-10-20 ENCOUNTER — Ambulatory Visit
Admission: RE | Admit: 2016-10-20 | Discharge: 2016-10-20 | Disposition: A | Discharge status: Home / Self Care | Source: Ambulatory Visit | Attending: Interventional Radiology | Admitting: Interventional Radiology

## 2016-10-20 ENCOUNTER — Other Ambulatory Visit: Payer: Self-pay

## 2016-10-20 DIAGNOSIS — J90 Pleural effusion, not elsewhere classified: Secondary | ICD-10-CM | POA: Insufficient documentation

## 2016-10-20 LAB — BODY FLUID CELL COUNT WITH DIFFERENTIAL
Eos, Fluid: 0 %
Lymphs, Fluid: 48 %
MONOCYTE-MACROPHAGE-SEROUS FLUID: 10 %
Neutrophil Count, Fluid: 42 %
Total Nucleated Cell Count, Fluid: 283 cu mm

## 2016-10-20 LAB — PROTEIN, BODY FLUID

## 2016-10-20 LAB — GLUCOSE, SEROUS FLUID: GLUCOSE FL: 149 mg/dL

## 2016-10-20 LAB — LACTATE DEHYDROGENASE, PLEURAL OR PERITONEAL FLUID: LD, Fluid: 52 U/L — ABNORMAL HIGH (ref 3–23)

## 2016-10-20 NOTE — Procedures (Signed)
Pleural effusions, shortness of breath, CHF  Ultrasound left thoracentesis  425 mL amber colored pleural fluid removed  No immediate complication  EBL 0  Chest x-ray pending  Full report in PACs

## 2016-10-21 LAB — ACID FAST SMEAR (AFB, MYCOBACTERIA): Acid Fast Smear: NEGATIVE

## 2016-10-21 LAB — MISC LABCORP TEST (SEND OUT): Labcorp test code: 19588

## 2016-10-21 LAB — ACID FAST SMEAR (AFB)

## 2016-10-22 ENCOUNTER — Ambulatory Visit
Admission: RE | Admit: 2016-10-22 | Discharge: 2016-10-22 | Disposition: A | Discharge status: Home / Self Care | Source: Ambulatory Visit | Attending: Cardiothoracic Surgery | Admitting: Cardiothoracic Surgery

## 2016-10-22 ENCOUNTER — Telehealth: Payer: Self-pay

## 2016-10-22 ENCOUNTER — Encounter: Payer: Self-pay | Admitting: Cardiothoracic Surgery

## 2016-10-22 ENCOUNTER — Ambulatory Visit (INDEPENDENT_AMBULATORY_CARE_PROVIDER_SITE_OTHER): Payer: Medicare Other | Admitting: Cardiothoracic Surgery

## 2016-10-22 ENCOUNTER — Inpatient Hospital Stay: Admission: RE | Admit: 2016-10-22 | Payer: Medicare Other | Source: Ambulatory Visit

## 2016-10-22 ENCOUNTER — Encounter
Admission: RE | Admit: 2016-10-22 | Discharge: 2016-10-22 | Disposition: A | Discharge status: Home / Self Care | Source: Ambulatory Visit | Attending: Cardiothoracic Surgery | Admitting: Cardiothoracic Surgery

## 2016-10-22 VITALS — BP 104/67 | HR 83 | Temp 97.9°F | Resp 18 | Ht 67.0 in | Wt 154.8 lb

## 2016-10-22 DIAGNOSIS — J90 Pleural effusion, not elsewhere classified: Secondary | ICD-10-CM

## 2016-10-22 DIAGNOSIS — I255 Ischemic cardiomyopathy: Secondary | ICD-10-CM | POA: Diagnosis not present

## 2016-10-22 DIAGNOSIS — Z01812 Encounter for preprocedural laboratory examination: Secondary | ICD-10-CM | POA: Insufficient documentation

## 2016-10-22 DIAGNOSIS — Z01818 Encounter for other preprocedural examination: Secondary | ICD-10-CM | POA: Insufficient documentation

## 2016-10-22 HISTORY — DX: Unspecified atrial fibrillation: I48.91

## 2016-10-22 HISTORY — DX: Presence of cardiac pacemaker: Z95.0

## 2016-10-22 HISTORY — DX: Anxiety disorder, unspecified: F41.9

## 2016-10-22 LAB — SURGICAL PCR SCREEN
MRSA, PCR: NEGATIVE
STAPHYLOCOCCUS AUREUS: POSITIVE — AB

## 2016-10-22 LAB — COMPREHENSIVE METABOLIC PANEL
ALBUMIN: 3 g/dL — AB (ref 3.5–5.0)
ALK PHOS: 176 U/L — AB (ref 38–126)
ALT: 11 U/L — ABNORMAL LOW (ref 17–63)
ANION GAP: 7 (ref 5–15)
AST: 18 U/L (ref 15–41)
BILIRUBIN TOTAL: 1.9 mg/dL — AB (ref 0.3–1.2)
BUN: 37 mg/dL — ABNORMAL HIGH (ref 6–20)
CALCIUM: 8.5 mg/dL — AB (ref 8.9–10.3)
CO2: 31 mmol/L (ref 22–32)
Chloride: 96 mmol/L — ABNORMAL LOW (ref 101–111)
Creatinine, Ser: 1.54 mg/dL — ABNORMAL HIGH (ref 0.61–1.24)
GFR calc non Af Amer: 40 mL/min — ABNORMAL LOW (ref >60)
GFR, EST AFRICAN AMERICAN: 46 mL/min — AB (ref >60)
Glucose, Bld: 74 mg/dL (ref 65–99)
POTASSIUM: 4.6 mmol/L (ref 3.5–5.1)
SODIUM: 134 mmol/L — AB (ref 135–145)
TOTAL PROTEIN: 7 g/dL (ref 6.5–8.1)

## 2016-10-22 LAB — CBC
HCT: 25.7 % — ABNORMAL LOW (ref 40.0–52.0)
HEMOGLOBIN: 8.6 g/dL — AB (ref 13.0–18.0)
MCH: 30.6 pg (ref 26.0–34.0)
MCHC: 33.4 g/dL (ref 32.0–36.0)
MCV: 91.6 fL (ref 80.0–100.0)
Platelets: 239 10*3/uL (ref 150–440)
RBC: 2.81 MIL/uL — ABNORMAL LOW (ref 4.40–5.90)
RDW: 20.1 % — AB (ref 11.5–14.5)
WBC: 7.4 10*3/uL (ref 3.8–10.6)

## 2016-10-22 LAB — CYTOLOGY - NON PAP

## 2016-10-22 LAB — PROTIME-INR
INR: 1.22
Prothrombin Time: 15.5 seconds — ABNORMAL HIGH (ref 11.4–15.2)

## 2016-10-22 LAB — APTT: APTT: 41 s — AB (ref 24–36)

## 2016-10-22 NOTE — Patient Instructions (Signed)
  Your procedure is scheduled [id number] 30, 2017 (Monday) Report to [facility] Day [facility] 2nd floor [facility] To find out your arrival time please call [phone] between 1PM - 3PM on October 22, 2016 (Friday)  Remember: Instructions that are not followed completely may result in serious medical risk, up to and including death, or upon the discretion of your surgeon and anesthesiologist your surgery may need to be rescheduled.    _x___ 1. Do not eat food or drink liquids after midnight. No gum chewing or hard candies.     __x__ 2. No Alcohol for 24 hours before or after surgery.   __x__3. No Smoking for 24 prior to surgery.   ____  4. Bring all medications with you on the day of surgery if instructed.    __x__ 5. Notify your doctor if there is any change in your medical condition     (cold, fever, infections).     Do not wear jewelry, make-up, hairpins, clips or nail polish.  Do not wear lotions, powders, or perfumes. You may wear deodorant.  Do not shave 48 hours prior to surgery. Men may shave face and neck.  Do not bring valuables to the hospital.    [facility] is not responsible for any belongings or valuables.               Contacts, dentures or bridgework may not be worn into surgery.  Leave your suitcase in the car. After surgery it may be brought to your room.  For patients admitted to the hospital, discharge time is determined by your treatment team.   Patients discharged the day of surgery will not be allowed to drive home.    Please read over the following fact sheets that you were given:   [facility] Preparing for Surgery and or MRSA Information   _x___ Take these medicines the morning of surgery with A SIP OF WATER:    1. Carvedilol  2. Isosorbide  3.  4.  5.  6.  ____Fleets enema or Magnesium Citrate as directed.   _x___ Use CHG Soap or sage wipes as directed on instruction sheet   ____ Use inhalers on the day of surgery and bring to hospital day of  surgery  ____ Stop metformin 2 days prior to surgery    ____ Take 1/2 of usual insulin dose the night before surgery and none on the morning of           surgery.   __x__ Stop aspirin or coumadin, or plavix (DO NOT GIVE ASPIRIN THE MORNING OF SURGERY)  x__ Stop Anti-inflammatories such as Advil, Aleve, Ibuprofen, Motrin, Naproxen,          Naprosyn, Goodies powders or aspirin products. Ok to take Tylenol.   x___ Stop supplements until after surgery.  (Stop Vitamin B-12 now)  ____ Bring C-Pap to the hospital.

## 2016-10-22 NOTE — Telephone Encounter (Signed)
Spoke with patient at this time. Patient was instructed to stop Aspirin at this time due to having surgery on 10/25/16 with Dr.Oaks. We will let him know when to start back taking the Aspirin. Patient verbalized understanding.

## 2016-10-22 NOTE — Pre-Procedure Instructions (Signed)
Dr. Randa Lynn made aware of patient cardiac history and recent EKG and stated patient okay for surgery.l

## 2016-10-22 NOTE — Progress Notes (Signed)
  Patient ID: Donald Henry, male   DOB: 27-Jan-1930, 82 y.o.   MRN: [id number]  HISTORY: This gentleman presents today with a nonfunctioning right-sided Pleurx catheter. I have been seeing him for the last several months and we have used intrapleural thrombolytics on several occasions. He and his family have had extensive discussions in the past about what to do with the nonfunctioning Pleurx. We have discussed removal of the catheter or replacement. The patient states that he did undergo a thoracentesis earlier this week with removal of about 400 cc of amber-colored fluid. He would like to have the catheter removed as it is no longer functioning and is a nuisance to him.   Vitals:   10/22/16 0808  BP: 104/67  Pulse: 83  Resp: 18  Temp: 97.9 F (36.6 C)     EXAM:    Resp: Lungs are clear on the left but diminished on the right.  No respiratory distress, normal effort. Heart:  Regular without murmurs Abd:  Abdomen is soft, non distended and non tender. No masses are palpable.  There is no rebound and no guarding.  Neurological: Alert and oriented to person, place, and time. Coordination normal.  Skin: Skin is warm and dry. No rash noted. No diaphoretic. No erythema. No pallor. Pleurx site is clean without drainage. Psychiatric: Normal mood and affect. Normal behavior. Judgment and thought content normal.    ASSESSMENT: I had a long discussion with he and his family again today. They would like to have the catheter removed. I told him that we could replace the catheter as well but they feel like they've had their fill of these catheters and would like to just come back to the ultrasound-guided thoracentesis if needed.   PLAN:   We will schedule the patient have the catheter removed. I reviewed with him the indications and risks. He understands that this will be performed as an outpatient procedure. We'll go ahead and set him up to see our preadmission testing folks today. All of his  questions were answered.    [name], MD

## 2016-10-22 NOTE — Pre-Procedure Instructions (Signed)
Positive staph results and CBC (Hgb 8.6/ Hct 25.7) results called and faxed to Dr. Genevive Bi office (spoke to [place])

## 2016-10-22 NOTE — Patient Instructions (Signed)
Please go to pre-admit today when leaving our office. We will plan to remove your catheter 10/25/16. Please refer to your blue pre-care sheet for your surgery information. Please call our office if you have any questions or concerns.

## 2016-10-22 NOTE — Telephone Encounter (Signed)
Received Pre-op labs from Pre-admit. Addressed the Hemoglobin of 8.6 with Dr. Genevive Bi. He wishes to proceed with surgery as planned.  Pre protocol, we will not medicate patient for positive staph PCR as his MRSA test was negative.  Patient has been educated to stop Aspirin for upcoming surgery.

## 2016-10-24 LAB — BODY FLUID CULTURE: CULTURE: NO GROWTH

## 2016-10-24 MED ORDER — DEXTROSE 5 % IV SOLN
1.5000 g | INTRAVENOUS | Status: AC
  Administered 2016-10-25: 1.5 g via INTRAVENOUS
  Filled 2016-10-24 (×2): qty 1.5

## 2016-10-25 ENCOUNTER — Encounter
Admission: RE | Disposition: A | Discharge status: Home / Self Care | Payer: Self-pay | Source: Ambulatory Visit | Attending: Cardiothoracic Surgery

## 2016-10-25 ENCOUNTER — Encounter: Payer: Self-pay | Admitting: *Deleted

## 2016-10-25 ENCOUNTER — Ambulatory Visit: Admitting: Anesthesiology

## 2016-10-25 ENCOUNTER — Ambulatory Visit
Admission: RE | Admit: 2016-10-25 | Discharge: 2016-10-25 | Disposition: A | Discharge status: Home / Self Care | Source: Ambulatory Visit | Attending: Cardiothoracic Surgery | Admitting: Cardiothoracic Surgery

## 2016-10-25 DIAGNOSIS — I4891 Unspecified atrial fibrillation: Secondary | ICD-10-CM | POA: Diagnosis not present

## 2016-10-25 DIAGNOSIS — Z7982 Long term (current) use of aspirin: Secondary | ICD-10-CM | POA: Insufficient documentation

## 2016-10-25 DIAGNOSIS — I252 Old myocardial infarction: Secondary | ICD-10-CM | POA: Diagnosis not present

## 2016-10-25 DIAGNOSIS — T859XXA Unspecified complication of internal prosthetic device, implant and graft, initial encounter: Secondary | ICD-10-CM | POA: Diagnosis not present

## 2016-10-25 DIAGNOSIS — Z950 Presence of cardiac pacemaker: Secondary | ICD-10-CM | POA: Diagnosis not present

## 2016-10-25 DIAGNOSIS — I081 Rheumatic disorders of both mitral and tricuspid valves: Secondary | ICD-10-CM | POA: Insufficient documentation

## 2016-10-25 DIAGNOSIS — I110 Hypertensive heart disease with heart failure: Secondary | ICD-10-CM | POA: Diagnosis not present

## 2016-10-25 DIAGNOSIS — Z888 Allergy status to other drugs, medicaments and biological substances status: Secondary | ICD-10-CM | POA: Diagnosis not present

## 2016-10-25 DIAGNOSIS — R011 Cardiac murmur, unspecified: Secondary | ICD-10-CM | POA: Insufficient documentation

## 2016-10-25 DIAGNOSIS — Y828 Other medical devices associated with adverse incidents: Secondary | ICD-10-CM | POA: Insufficient documentation

## 2016-10-25 DIAGNOSIS — J90 Pleural effusion, not elsewhere classified: Secondary | ICD-10-CM | POA: Diagnosis not present

## 2016-10-25 DIAGNOSIS — Y838 Other surgical procedures as the cause of abnormal reaction of the patient, or of later complication, without mention of misadventure at the time of the procedure: Secondary | ICD-10-CM | POA: Diagnosis not present

## 2016-10-25 DIAGNOSIS — I509 Heart failure, unspecified: Secondary | ICD-10-CM | POA: Insufficient documentation

## 2016-10-25 HISTORY — PX: REMOVAL OF PLEURAL DRAINAGE CATHETER: SHX5080

## 2016-10-25 LAB — GLUCOSE, CAPILLARY
GLUCOSE-CAPILLARY: 145 mg/dL — AB (ref 65–99)
Glucose-Capillary: 142 mg/dL — ABNORMAL HIGH (ref 65–99)

## 2016-10-25 SURGERY — MINOR REMOVAL OF PLEURAL DRAINAGE CATHETER
Anesthesia: General | Site: Abdomen | Wound class: Clean Contaminated

## 2016-10-25 MED ORDER — LIDOCAINE HCL (PF) 1 % IJ SOLN
INTRAMUSCULAR | Status: AC
  Filled 2016-10-25: qty 30

## 2016-10-25 MED ORDER — ONDANSETRON HCL 4 MG/2ML IJ SOLN: 4.0000 mg | Freq: Once | INTRAMUSCULAR | Status: DC | PRN

## 2016-10-25 MED ORDER — FAMOTIDINE 20 MG PO TABS
ORAL_TABLET | ORAL | Status: AC
  Administered 2016-10-25: 20 mg via ORAL
  Filled 2016-10-25: qty 1

## 2016-10-25 MED ORDER — FENTANYL CITRATE (PF) 100 MCG/2ML IJ SOLN: 25.0000 ug | INTRAMUSCULAR | Status: DC | PRN

## 2016-10-25 MED ORDER — LIDOCAINE HCL (PF) 1 % IJ SOLN
INTRAMUSCULAR | Status: DC | PRN
  Administered 2016-10-25: 2 mL

## 2016-10-25 MED ORDER — FAMOTIDINE 20 MG PO TABS
20.0000 mg | ORAL_TABLET | Freq: Once | ORAL | Status: AC
  Administered 2016-10-25: 20 mg via ORAL

## 2016-10-25 MED ORDER — FENTANYL CITRATE (PF) 100 MCG/2ML IJ SOLN
INTRAMUSCULAR | Status: DC | PRN
  Administered 2016-10-25 (×2): 25 ug via INTRAVENOUS

## 2016-10-25 MED ORDER — SODIUM CHLORIDE 0.9 % IV SOLN
INTRAVENOUS | Status: DC
  Administered 2016-10-25 (×2): via INTRAVENOUS

## 2016-10-25 MED ORDER — PROPOFOL 10 MG/ML IV BOLUS
INTRAVENOUS | Status: DC | PRN
  Administered 2016-10-25 (×4): 10 mg via INTRAVENOUS

## 2016-10-25 MED ORDER — PROPOFOL 500 MG/50ML IV EMUL
INTRAVENOUS | Status: DC | PRN
  Administered 2016-10-25: 25 ug/kg/min via INTRAVENOUS

## 2016-10-25 MED ORDER — EPHEDRINE SULFATE 50 MG/ML IJ SOLN
INTRAMUSCULAR | Status: DC | PRN
  Administered 2016-10-25: 10 mg via INTRAVENOUS

## 2016-10-25 SURGICAL SUPPLY — 43 items
BLADE SURG 15 STRL LF DISP TIS (BLADE) ×2 IMPLANT
BLADE SURG 15 STRL SS (BLADE) ×1
CANISTER SUCT 1200ML W/VALVE (MISCELLANEOUS) ×3 IMPLANT
CHLORAPREP W/TINT 26ML (MISCELLANEOUS) ×3 IMPLANT
CNTNR SPEC 2.5X3XGRAD LEK (MISCELLANEOUS) ×2
CONT SPEC 4OZ STER OR WHT (MISCELLANEOUS) ×1
CONTAINER SPEC 2.5X3XGRAD LEK (MISCELLANEOUS) ×2 IMPLANT
CORD BIP STRL DISP 12FT (MISCELLANEOUS) ×3 IMPLANT
DRAIN CHEST DRY SUCT SGL (MISCELLANEOUS) IMPLANT
DRAPE INCISE IOBAN 66X45 STRL (DRAPES) ×3 IMPLANT
DRAPE LAPAROTOMY 77X122 PED (DRAPES) ×3 IMPLANT
DRESSING TELFA 4X3 1S ST N-ADH (GAUZE/BANDAGES/DRESSINGS) ×3 IMPLANT
ELECT REM PT RETURN 9FT ADLT (ELECTROSURGICAL) ×3
ELECTRODE REM PT RTRN 9FT ADLT (ELECTROSURGICAL) ×2 IMPLANT
FORCEPS JEWEL BIP 4-3/4 STR (INSTRUMENTS) ×3 IMPLANT
GAUZE SPONGE 4X4 12PLY STRL (GAUZE/BANDAGES/DRESSINGS) ×3 IMPLANT
GLOVE SURG SYN 7.5  E (GLOVE) ×6
GLOVE SURG SYN 7.5 E (GLOVE) ×12 IMPLANT
GOWN STRL REUS W/ TWL LRG LVL3 (GOWN DISPOSABLE) ×6 IMPLANT
GOWN STRL REUS W/TWL LRG LVL3 (GOWN DISPOSABLE) ×3
KIT PLEURX DRAIN CATH 500ML (KITS) IMPLANT
KIT RM TURNOVER STRD PROC AR (KITS) ×3 IMPLANT
LABEL OR SOLS (LABEL) IMPLANT
MARKER SKIN DUAL TIP RULER LAB (MISCELLANEOUS) ×3 IMPLANT
NEEDLE HYPO 22GX1.5 SAFETY (NEEDLE) ×3 IMPLANT
PACK BASIN MINOR ARMC (MISCELLANEOUS) ×3 IMPLANT
SUCTION FRAZIER HANDLE 10FR (MISCELLANEOUS) ×1
SUCTION TUBE FRAZIER 10FR DISP (MISCELLANEOUS) ×2 IMPLANT
SUT ETH BLK MONO 3 0 FS 1 12/B (SUTURE) IMPLANT
SUT ETHILON 4-0 (SUTURE) ×1
SUT ETHILON 4-0 FS2 18XMFL BLK (SUTURE) ×2
SUT ETHILON NAB PS2 4-0 18IN (SUTURE) ×3 IMPLANT
SUT SILK 0 (SUTURE) ×1
SUT SILK 0 30XBRD TIE 6 (SUTURE) ×2 IMPLANT
SUT SILK 1 SH (SUTURE) IMPLANT
SUT VIC AB 0 SH 27 (SUTURE) ×3 IMPLANT
SUT VIC AB 2-0 SH 27 (SUTURE) ×1
SUT VIC AB 2-0 SH 27XBRD (SUTURE) ×2 IMPLANT
SUT VIC AB 3-0 SH 27 (SUTURE) ×1
SUT VIC AB 3-0 SH 27X BRD (SUTURE) ×2 IMPLANT
SUTURE ETHLN 4-0 FS2 18XMF BLK (SUTURE) ×2 IMPLANT
SYR 20CC LL (SYRINGE) ×3 IMPLANT
WATER STERILE IRR 1000ML POUR (IV SOLUTION) ×3 IMPLANT

## 2016-10-25 NOTE — Transfer of Care (Signed)
Immediate Anesthesia Transfer of Care Note  Patient: Donald Henry  Procedure(s) Performed: Procedure(s): PLEUREX CATHETER REMOVAL (Right)  Patient Location: PACU  Anesthesia Type:MAC  Level of Consciousness: sedated  Airway & Oxygen Therapy: Patient Spontanous Breathing and Patient connected to nasal cannula oxygen  Post-op Assessment: Report given to RN and Post -op Vital signs reviewed and stable  Post vital signs: Reviewed and stable  Last Vitals:  Vitals:   10/25/16 0842  BP: 131/63  Pulse: (!) 59  Resp: 18  Temp: (!) 36 C    Last Pain:  Vitals:   10/25/16 0842  TempSrc: Tympanic  PainSc: 0-No pain         Complications: No apparent anesthesia complications

## 2016-10-25 NOTE — Progress Notes (Signed)
Sat variable   88 to 95   When more awake higher  No sob  Called dr Rosey Bath  No new orders   Lungs clear

## 2016-10-25 NOTE — Discharge Instructions (Signed)
AMBULATORY SURGERY  DISCHARGE INSTRUCTIONS   1) The drugs that you were given will stay in your system until tomorrow so for the next 24 hours you should not:  A) Drive an automobile B) Make any legal decisions C) Drink any alcoholic beverage   2) You may resume regular meals tomorrow.  Today it is better to start with liquids and gradually work up to solid foods.  You may eat anything you prefer, but it is better to start with liquids, then soup and crackers, and gradually work up to solid foods.   3) Please notify your doctor immediately if you have any unusual bleeding, trouble breathing, redness and pain at the surgery site, drainage, fever, or pain not relieved by medication.    4) Additional Instructions: sudden SOB   Please contact your physician with any problems or Same Day Surgery at [phone], Monday through Friday 6 am to 4 pm, or [place] [facility] number at [phone].

## 2016-10-25 NOTE — Op Note (Signed)
  10/25/2016  10:54 AM  PATIENT:  Donald Henry  82 y.o. male  PRE-OPERATIVE DIAGNOSIS:  Non Functioning PleurX catheter   POST-OPERATIVE DIAGNOSIS:  Same    PROCEDURE:  Removal of PleurX catheter   SURGEON:  Surgeon(s) and Role:    * [name], MD - Primary  ASSISTANTS: Sophia Caccavale PAS  ANESTHESIA: MAC   INDICATIONS FOR PROCEDURE Nonfunctioning PleurX catheter  DICTATION: Patient was brought to the operating suite and placed in the supine position. After the patient was appropriately sedated he was then turned into the left lateral decubitus with exposure of the right Pleurx catheter. Patient was prepped and draped in usual sterile fashion. Using 1% lidocaine as a local anesthetic a skin wheal was raised overlying the Pleurx catheter cuff. The catheter was then dissected from the subcutaneous tissues using blunt and sharp dissection with bipolar cautery as needed. The catheter was withdrawn from the pleural space. It was then transected at the skin level and removed completely. Hemostasis was complete. Vicryl was used to approximate the subcutaneous tissues and nylon was used on the skin. Sterile dressings were applied. The patient tolerated the procedure well was taken to the recovery room in stable condition.   [name], MD

## 2016-10-25 NOTE — Interval H&P Note (Signed)
History and Physical Interval Note:  10/25/2016 9:25 AM  Donald Henry  has presented today for surgery, with the diagnosis of PLEURAL EFFUSION  The various methods of treatment have been discussed with the patient and family. After consideration of risks, benefits and other options for treatment, the patient has consented to  Procedure(s): [place] (N/A) as a surgical intervention .  The patient's history has been reviewed, patient examined, no change in status, stable for surgery.  I have reviewed the patient's chart and labs.  Questions were answered to the patient's satisfaction.     [name]

## 2016-10-25 NOTE — Anesthesia Procedure Notes (Signed)
Date/Time: 10/25/2016 10:10 AM Performed by: Allean Found Pre-anesthesia Checklist: Emergency Drugs available, Patient identified, Suction available, Patient being monitored and Timeout performed Patient Re-evaluated:Patient Re-evaluated prior to inductionOxygen Delivery Method: Nasal cannula Preoxygenation: Pre-oxygenation with 100% oxygen Intubation Type: IV induction Placement Confirmation: positive ETCO2

## 2016-10-25 NOTE — H&P (View-Only) (Signed)
  Patient ID: Donald Henry, male   DOB: 03/28/1930, 82 y.o.   MRN: [id number]  HISTORY: This gentleman presents today with a nonfunctioning right-sided Pleurx catheter. I have been seeing him for the last several months and we have used intrapleural thrombolytics on several occasions. He and his family have had extensive discussions in the past about what to do with the nonfunctioning Pleurx. We have discussed removal of the catheter or replacement. The patient states that he did undergo a thoracentesis earlier this week with removal of about 400 cc of amber-colored fluid. He would like to have the catheter removed as it is no longer functioning and is a nuisance to him.   Vitals:   10/22/16 0808  BP: 104/67  Pulse: 83  Resp: 18  Temp: 97.9 F (36.6 C)     EXAM:    Resp: Lungs are clear on the left but diminished on the right.  No respiratory distress, normal effort. Heart:  Regular without murmurs Abd:  Abdomen is soft, non distended and non tender. No masses are palpable.  There is no rebound and no guarding.  Neurological: Alert and oriented to person, place, and time. Coordination normal.  Skin: Skin is warm and dry. No rash noted. No diaphoretic. No erythema. No pallor. Pleurx site is clean without drainage. Psychiatric: Normal mood and affect. Normal behavior. Judgment and thought content normal.    ASSESSMENT: I had a long discussion with he and his family again today. They would like to have the catheter removed. I told him that we could replace the catheter as well but they feel like they've had their fill of these catheters and would like to just come back to the ultrasound-guided thoracentesis if needed.   PLAN:   We will schedule the patient have the catheter removed. I reviewed with him the indications and risks. He understands that this will be performed as an outpatient procedure. We'll go ahead and set him up to see our preadmission testing folks today. All of his  questions were answered.    [name], MD

## 2016-10-25 NOTE — Anesthesia Postprocedure Evaluation (Signed)
Anesthesia Post Note  Patient: Donald Henry  Procedure(s) Performed: Procedure(s) (LRB): PLEUREX CATHETER REMOVAL (Right)  Patient location during evaluation: PACU Anesthesia Type: General Level of consciousness: awake and alert Pain management: pain level controlled Vital Signs Assessment: post-procedure vital signs reviewed and stable Respiratory status: spontaneous breathing, nonlabored ventilation and respiratory function stable Cardiovascular status: blood pressure returned to baseline and stable Postop Assessment: no signs of nausea or vomiting Anesthetic complications: no    Last Vitals:  Vitals:   10/25/16 1123 10/25/16 1135  BP: (!) 97/59 109/62  Pulse: 61 64  Resp: 18 16  Temp: 36.2 C 36.4 C    Last Pain:  Vitals:   10/25/16 1135  TempSrc: Oral  PainSc:                  Martha Clan

## 2016-10-25 NOTE — Anesthesia Preprocedure Evaluation (Signed)
Anesthesia Evaluation    History of Anesthesia Complications Negative for: history of anesthetic complications  Airway Mallampati: III       Dental  (+) Caps, Poor Dentition   Pulmonary shortness of breath, neg sleep apnea, neg COPD, neg recent URI,  Recurrent pleural effusion    Pulmonary exam normal breath sounds clear to auscultation       Cardiovascular Exercise Tolerance: Poor hypertension, (-) angina+ CAD, + Past MI and +CHF  (-) Cardiac Stents and (-) CABG + dysrhythmias Atrial Fibrillation + pacemaker (-) Cardiac Defibrillator + Valvular Problems/Murmurs MR  Rhythm:irregular Rate:Abnormal + Systolic murmurs Echo [id number]: - Left ventricle: The cavity size was normal. There was moderate   concentric hypertrophy. Systolic function was moderately to   severely reduced. The estimated ejection fraction was in the   range of 30% to 35%. - Mitral valve: There was moderate regurgitation. - Left atrium: The atrium was moderately dilated. - Right atrium: The atrium was mildly dilated. - Tricuspid valve: There was moderate regurgitation. - Pulmonary arteries: Systolic pressure was moderately increased.   PA peak pressure: 50 mm Hg (S). - Inferior vena cava: The vessel was severely dilated. The   respirophasic diameter changes were blunted (< 50%), severely   elevated central venous pressure.  NM myocardial perfusion 03/29/16:  Defect 1: There is a large defect of severe severity present in the mid anterior, mid anterolateral, apical anterior and apex location.  Findings consistent with prior myocardial infarction with moderate peri-infarct ischemia.  This is a high risk study.  Nuclear stress EF: 33%.    Neuro/Psych negative neurological ROS  negative psych ROS   GI/Hepatic hiatal hernia, PUD,   Endo/Other  diabetes, Type 2, Oral Hypoglycemic Agents  Renal/GU CRFRenal disease     Musculoskeletal   Abdominal    Peds  Hematology  (+) anemia ,   Anesthesia Other Findings Past Medical History: No date: Anxiety No date: Atrial fibrillation (HCC) No date: BPH (benign prostatic hyperplasia) No date: Chronic combined systolic (congestive) and dia*     Comment: a. 01/2015 Echo: EF 40%, mod MR/PR/TR;  b.               10/2015 Echo: EF 20-25%, sev MR, sev dil LA/RA,              sev TR. No date: CKD (chronic kidney disease), stage III No date: Diabetes mellitus without complication (HCC) No date: Hiatal hernia No date: Hypertension No date: Hypertensive heart disease No date: Ischemic cardiomyopathy No date: Myocardial infarction     Comment: a. MI @ age 82-->conservatively managed.  Has               never had a cath.  Reports multiple negative               stress tests. No date: Normocytic anemia No date: Obesity No date: Peptic ulcer disease No date: Presence of permanent cardiac pacemaker No date: Pulmonary embolism ([place])     Comment: a. 10/2015 ->Eliquis. No date: Recurrent right pleural effusion     Comment: a. 01/28/2016 1.7L fluid removal via               thoracentesis; b. 02/19/2016 2.4L removal via               thoracentesis. No date: Severe mitral regurgitation     Comment: a. 10/2015 Echo: sev MR. No date: SSS (sick sinus syndrome) (HCC)     Comment: a. s/p MDT  ADSRO1 Single Lead PPM.   Reproductive/Obstetrics negative OB ROS                             Anesthesia Physical  Anesthesia Plan  ASA: IV  Anesthesia Plan: General   Post-op Pain Management:    Induction:   Airway Management Planned:   Additional Equipment:   Intra-op Plan:   Post-operative Plan:   Informed Consent: I have reviewed the patients History and Physical, chart, labs and discussed the procedure including the risks, benefits and alternatives for the proposed anesthesia with the patient or authorized representative who has indicated his/her understanding and  acceptance.   Dental Advisory Given  Plan Discussed with: Anesthesiologist, CRNA and Surgeon  Anesthesia Plan Comments:         Anesthesia Quick Evaluation

## 2016-10-26 LAB — SURGICAL PATHOLOGY

## 2016-10-27 ENCOUNTER — Ambulatory Visit: Payer: Medicare Other

## 2016-10-29 ENCOUNTER — Telehealth: Payer: Self-pay

## 2016-10-29 ENCOUNTER — Ambulatory Visit (INDEPENDENT_AMBULATORY_CARE_PROVIDER_SITE_OTHER): Payer: Medicare Other | Admitting: Cardiothoracic Surgery

## 2016-10-29 ENCOUNTER — Encounter: Payer: Self-pay | Admitting: Cardiothoracic Surgery

## 2016-10-29 VITALS — BP 116/71 | HR 65 | Temp 97.5°F

## 2016-10-29 DIAGNOSIS — J90 Pleural effusion, not elsewhere classified: Secondary | ICD-10-CM

## 2016-10-29 NOTE — Patient Instructions (Addendum)
Please give [place] a call if you have any questions or concerns.  I will call [name] to give her a verbal order to remove your stitches on Wednesday.

## 2016-10-29 NOTE — Progress Notes (Signed)
His wounds look quite good. He had his Pleurx catheter removed 4 days ago. The wound itself is clean dry and intact. On physical exam his lungs are markedly diminished on the right. He is scheduled to have another thoracentesis done next Wednesday. I believe his sutures can come out in one week and the family said that the hospice nurse will be able to do that. I did not make a follow-up appointment form.

## 2016-10-29 NOTE — Telephone Encounter (Signed)
Called Stephanie's cell phone but she had a voicemail stating that she would be out of the office today (10/29/2016) so to please call [phone]. I called and spoke with Sharon-Triage RN form Hosp[facility] to let her know that Dr. Genevive Bi is giving a verbal order for her to remove patient's stitches on November 05, 2016. Ivin Booty stated that she would give [name] the message on Monday 11/01/2016 she came back. I told Ivin Booty to call me in case they had questions.

## 2016-11-01 ENCOUNTER — Encounter: Payer: Self-pay | Admitting: Cardiothoracic Surgery

## 2016-11-02 ENCOUNTER — Telehealth: Payer: Self-pay | Admitting: Cardiology

## 2016-11-02 NOTE — Telephone Encounter (Signed)
Attempted to schedule fu from recall list.  Per daughter in law patient not doing well.  Hospice has been stated and he is getting a hospital bed at home.   Family aware to call office if needing to be seen.

## 2016-11-03 ENCOUNTER — Ambulatory Visit
Admission: RE | Admit: 2016-11-03 | Discharge: 2016-11-03 | Disposition: A | Discharge status: Home / Self Care | Source: Ambulatory Visit | Attending: Diagnostic Radiology | Admitting: Diagnostic Radiology

## 2016-11-03 ENCOUNTER — Ambulatory Visit
Admission: RE | Admit: 2016-11-03 | Discharge: 2016-11-03 | Disposition: A | Discharge status: Home / Self Care | Source: Ambulatory Visit | Attending: Specialist | Admitting: Specialist

## 2016-11-03 ENCOUNTER — Other Ambulatory Visit: Payer: Self-pay | Admitting: Specialist

## 2016-11-03 DIAGNOSIS — J90 Pleural effusion, not elsewhere classified: Secondary | ICD-10-CM

## 2016-11-03 DIAGNOSIS — Z9889 Other specified postprocedural states: Secondary | ICD-10-CM

## 2016-11-03 LAB — LACTATE DEHYDROGENASE, PLEURAL OR PERITONEAL FLUID: LD, Fluid: 59 U/L — ABNORMAL HIGH (ref 3–23)

## 2016-11-03 LAB — PROTEIN, BODY FLUID: Total protein, fluid: <3 g/dL

## 2016-11-03 LAB — BODY FLUID CELL COUNT WITH DIFFERENTIAL
Eos, Fluid: 0 %
Lymphs, Fluid: 17 %
Monocyte-Macrophage-Serous Fluid: 14 %
NEUTROPHIL FLUID: 69 %
OTHER CELLS FL: 0 %
WBC FLUID: 172 uL

## 2016-11-03 LAB — GLUCOSE, SEROUS FLUID: Glucose, Fluid: 141 mg/dL

## 2016-11-03 NOTE — Procedures (Signed)
Left thoracentesis without difficulty  Complications:  None  Blood Loss: none  See dictation in canopy pacs  

## 2016-11-04 LAB — MISC LABCORP TEST (SEND OUT): Labcorp test code: 19588

## 2016-11-05 LAB — CYTOLOGY - NON PAP

## 2016-11-07 LAB — BODY FLUID CULTURE: CULTURE: NO GROWTH

## 2016-11-10 ENCOUNTER — Ambulatory Visit
Admission: RE | Admit: 2016-11-10 | Discharge: 2016-11-10 | Disposition: A | Discharge status: Home / Self Care | Source: Ambulatory Visit | Attending: Interventional Radiology | Admitting: Interventional Radiology

## 2016-11-10 ENCOUNTER — Ambulatory Visit
Admission: RE | Admit: 2016-11-10 | Discharge: 2016-11-10 | Disposition: A | Discharge status: Home / Self Care | Source: Ambulatory Visit | Attending: Specialist | Admitting: Specialist

## 2016-11-10 DIAGNOSIS — J90 Pleural effusion, not elsewhere classified: Secondary | ICD-10-CM | POA: Insufficient documentation

## 2016-11-10 DIAGNOSIS — Z9889 Other specified postprocedural states: Secondary | ICD-10-CM

## 2016-11-17 ENCOUNTER — Other Ambulatory Visit: Payer: Self-pay | Admitting: Specialist

## 2016-11-17 ENCOUNTER — Ambulatory Visit: Admission: RE | Admit: 2016-11-17 | Source: Ambulatory Visit

## 2016-11-17 DIAGNOSIS — J90 Pleural effusion, not elsewhere classified: Secondary | ICD-10-CM | POA: Insufficient documentation

## 2016-11-17 LAB — FUNGUS CULTURE WITH STAIN

## 2016-11-17 LAB — FUNGAL ORGANISM REFLEX

## 2016-11-17 LAB — FUNGUS CULTURE RESULT

## 2016-11-24 ENCOUNTER — Ambulatory Visit
Admission: RE | Admit: 2016-11-24 | Discharge: 2016-11-24 | Disposition: A | Discharge status: Home / Self Care | Source: Ambulatory Visit | Attending: Interventional Radiology | Admitting: Interventional Radiology

## 2016-11-24 ENCOUNTER — Ambulatory Visit
Admission: RE | Admit: 2016-11-24 | Discharge: 2016-11-24 | Disposition: A | Discharge status: Home / Self Care | Source: Ambulatory Visit | Attending: Specialist | Admitting: Specialist

## 2016-11-24 ENCOUNTER — Other Ambulatory Visit: Payer: Self-pay | Admitting: Specialist

## 2016-11-24 VITALS — BP 101/50 | HR 61

## 2016-11-24 DIAGNOSIS — J90 Pleural effusion, not elsewhere classified: Secondary | ICD-10-CM

## 2016-11-24 NOTE — Procedures (Signed)
Recurrent left pleural effusion, shortness of breath, chronic CHF  Ultrasound left thoracentesis  620 mL removed  No immediate complication  Full report in PACs

## 2016-12-01 ENCOUNTER — Ambulatory Visit: Admission: RE | Admit: 2016-12-01 | Source: Ambulatory Visit

## 2016-12-02 LAB — FUNGUS CULTURE WITH STAIN

## 2016-12-02 LAB — FUNGUS CULTURE RESULT

## 2016-12-02 LAB — FUNGAL ORGANISM REFLEX

## 2016-12-08 ENCOUNTER — Ambulatory Visit

## 2016-12-15 ENCOUNTER — Ambulatory Visit

## 2016-12-17 LAB — ACID FAST CULTURE WITH REFLEXED SENSITIVITIES (MYCOBACTERIA)

## 2016-12-22 ENCOUNTER — Ambulatory Visit

## 2016-12-27 DEATH — deceased

## 2017-04-22 ENCOUNTER — Encounter: Payer: Self-pay | Admitting: Internal Medicine

## 2017-10-26 IMAGING — CR DG CHEST 2V
2 series · 2 of 2 positions shown · non-contrast
Comparison: 01/29/2016

CLINICAL DATA: Status post right thoracentesis

EXAM:
CHEST  2 VIEW

[chest pa]
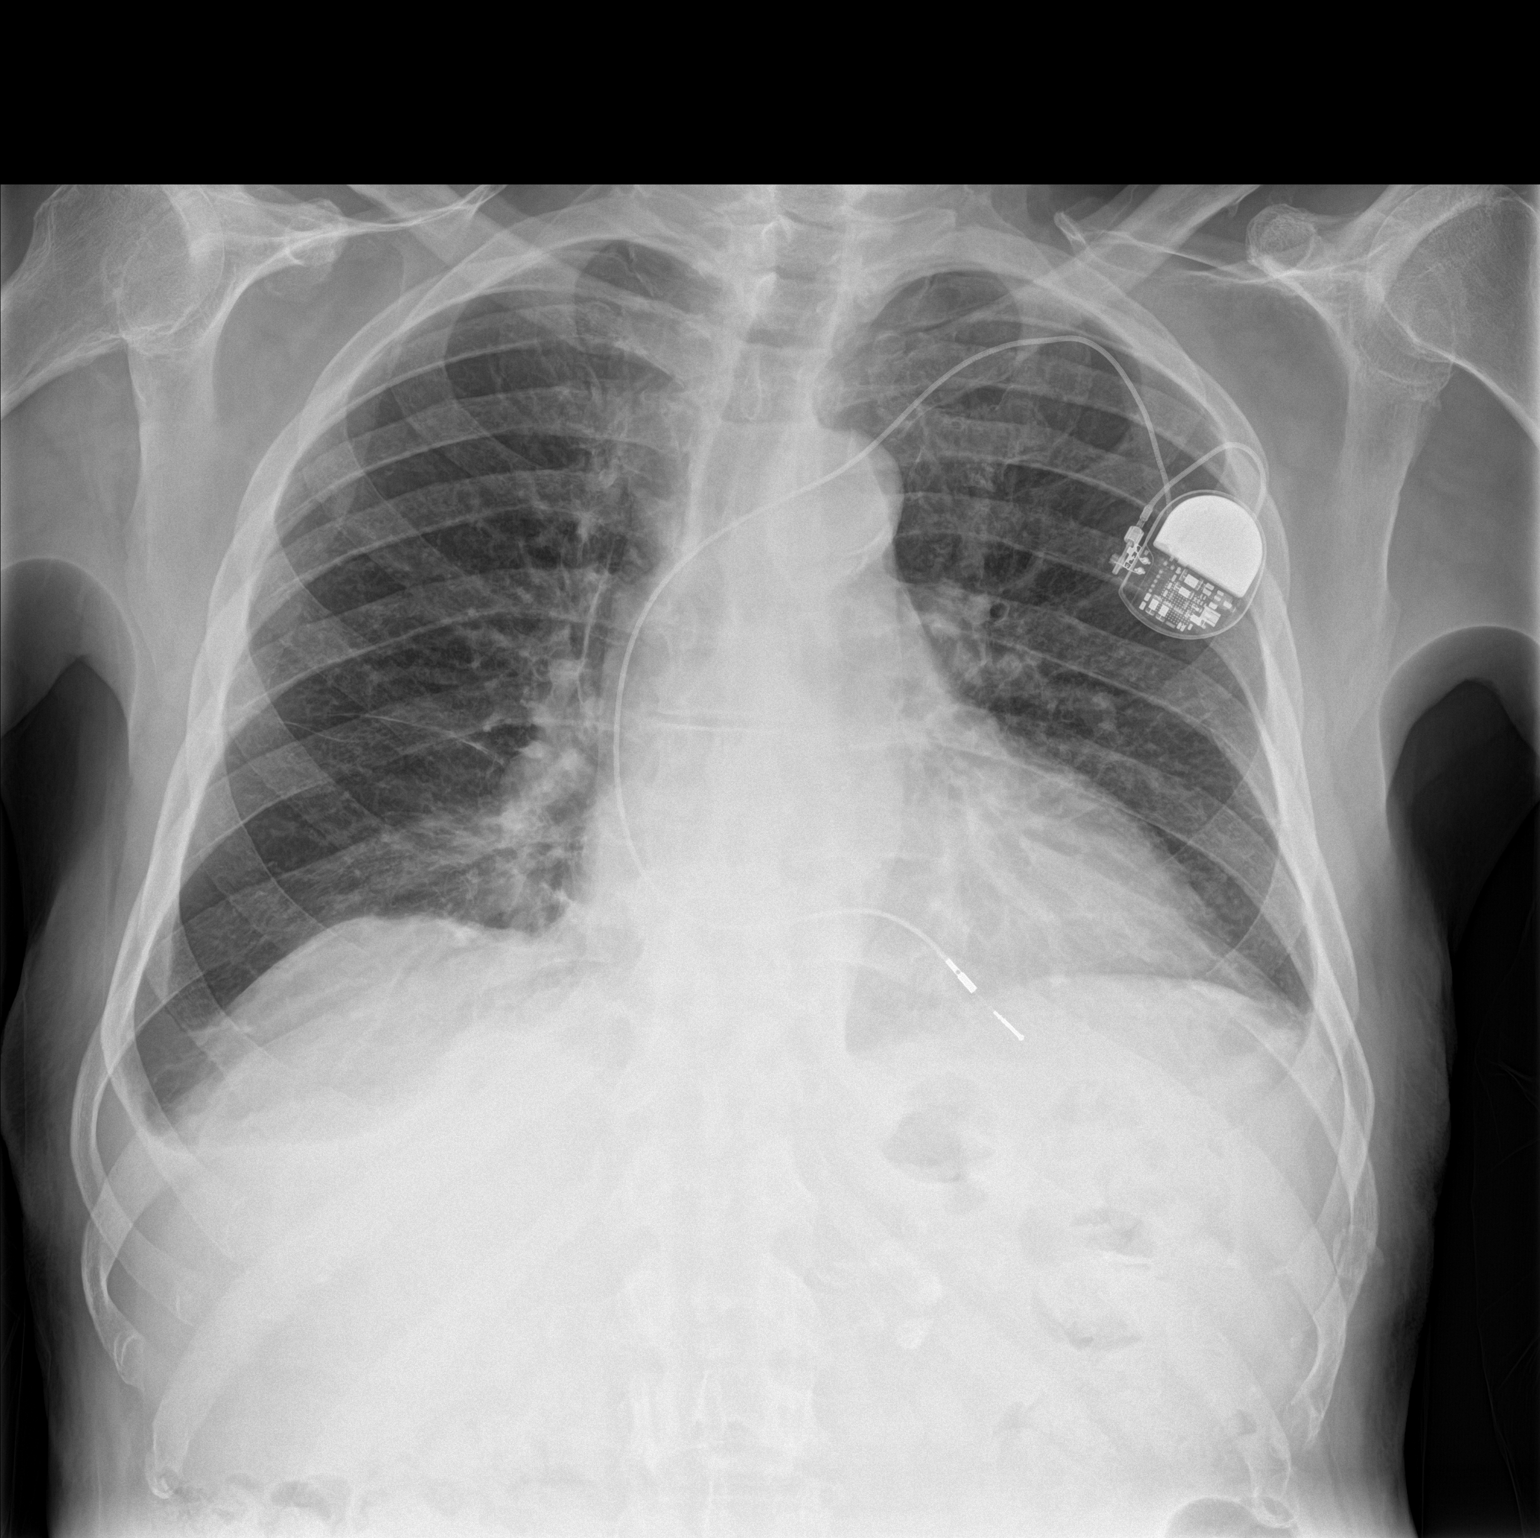

[chest lat]
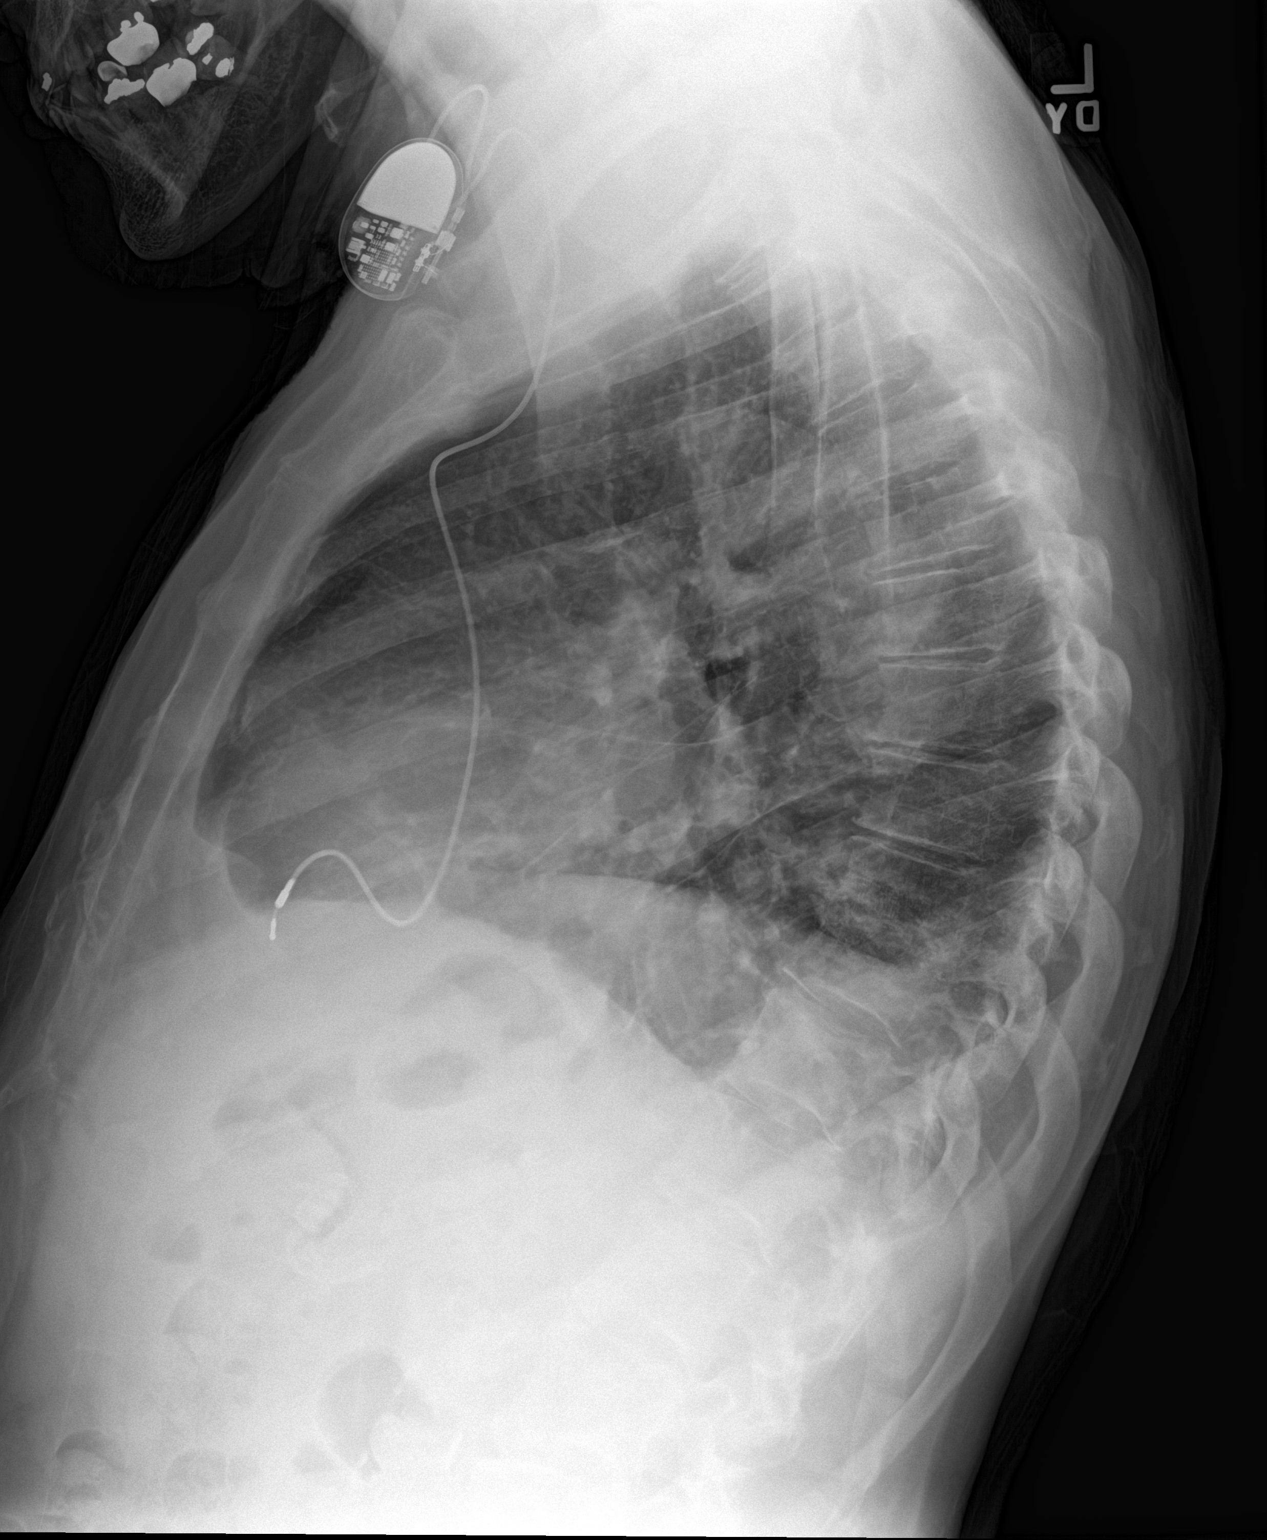

[2 of 2 positions shown; findings below may reference images not displayed]

FINDINGS: Cardiac shadow is within normal limits. A pacing device is noted.
The previously seen right-sided pleural effusion is now almost
completely resolved. A tiny amount of residual fluid is noted. No
pneumothorax is seen. The left lung is clear. No bony abnormality is
noted.
IMPRESSION: No evidence of post thoracentesis pneumothorax.

Patient is scheduled for discharge to home.

## 2018-06-10 IMAGING — CR DG CHEST 2V
2 series · 2 of 2 positions shown · non-contrast
Comparison: 09/16/2016

CLINICAL DATA: Follow-up chest x-ray.  Chest tube placement.

EXAM:
CHEST  2 VIEW

[chest pa]
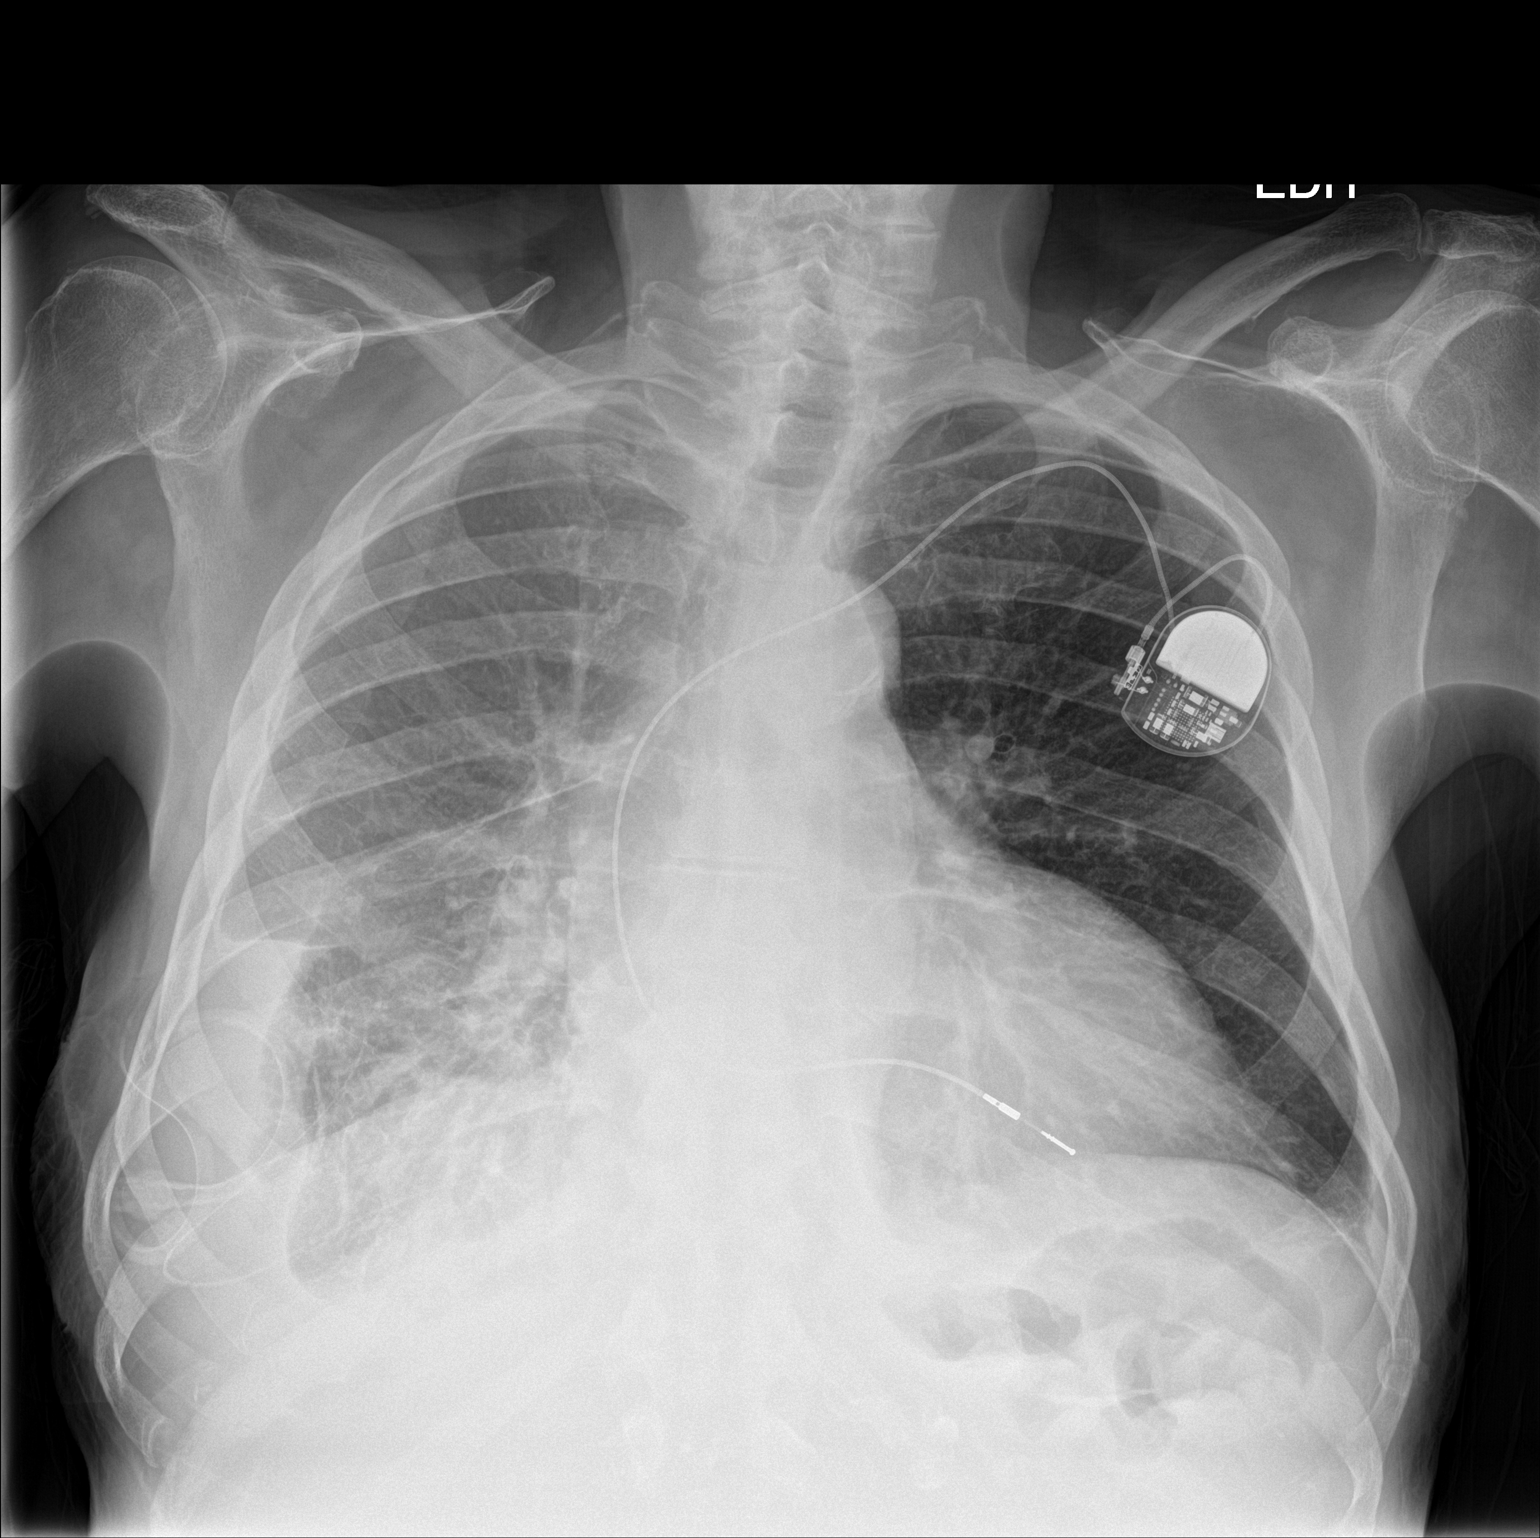

[chest lat]
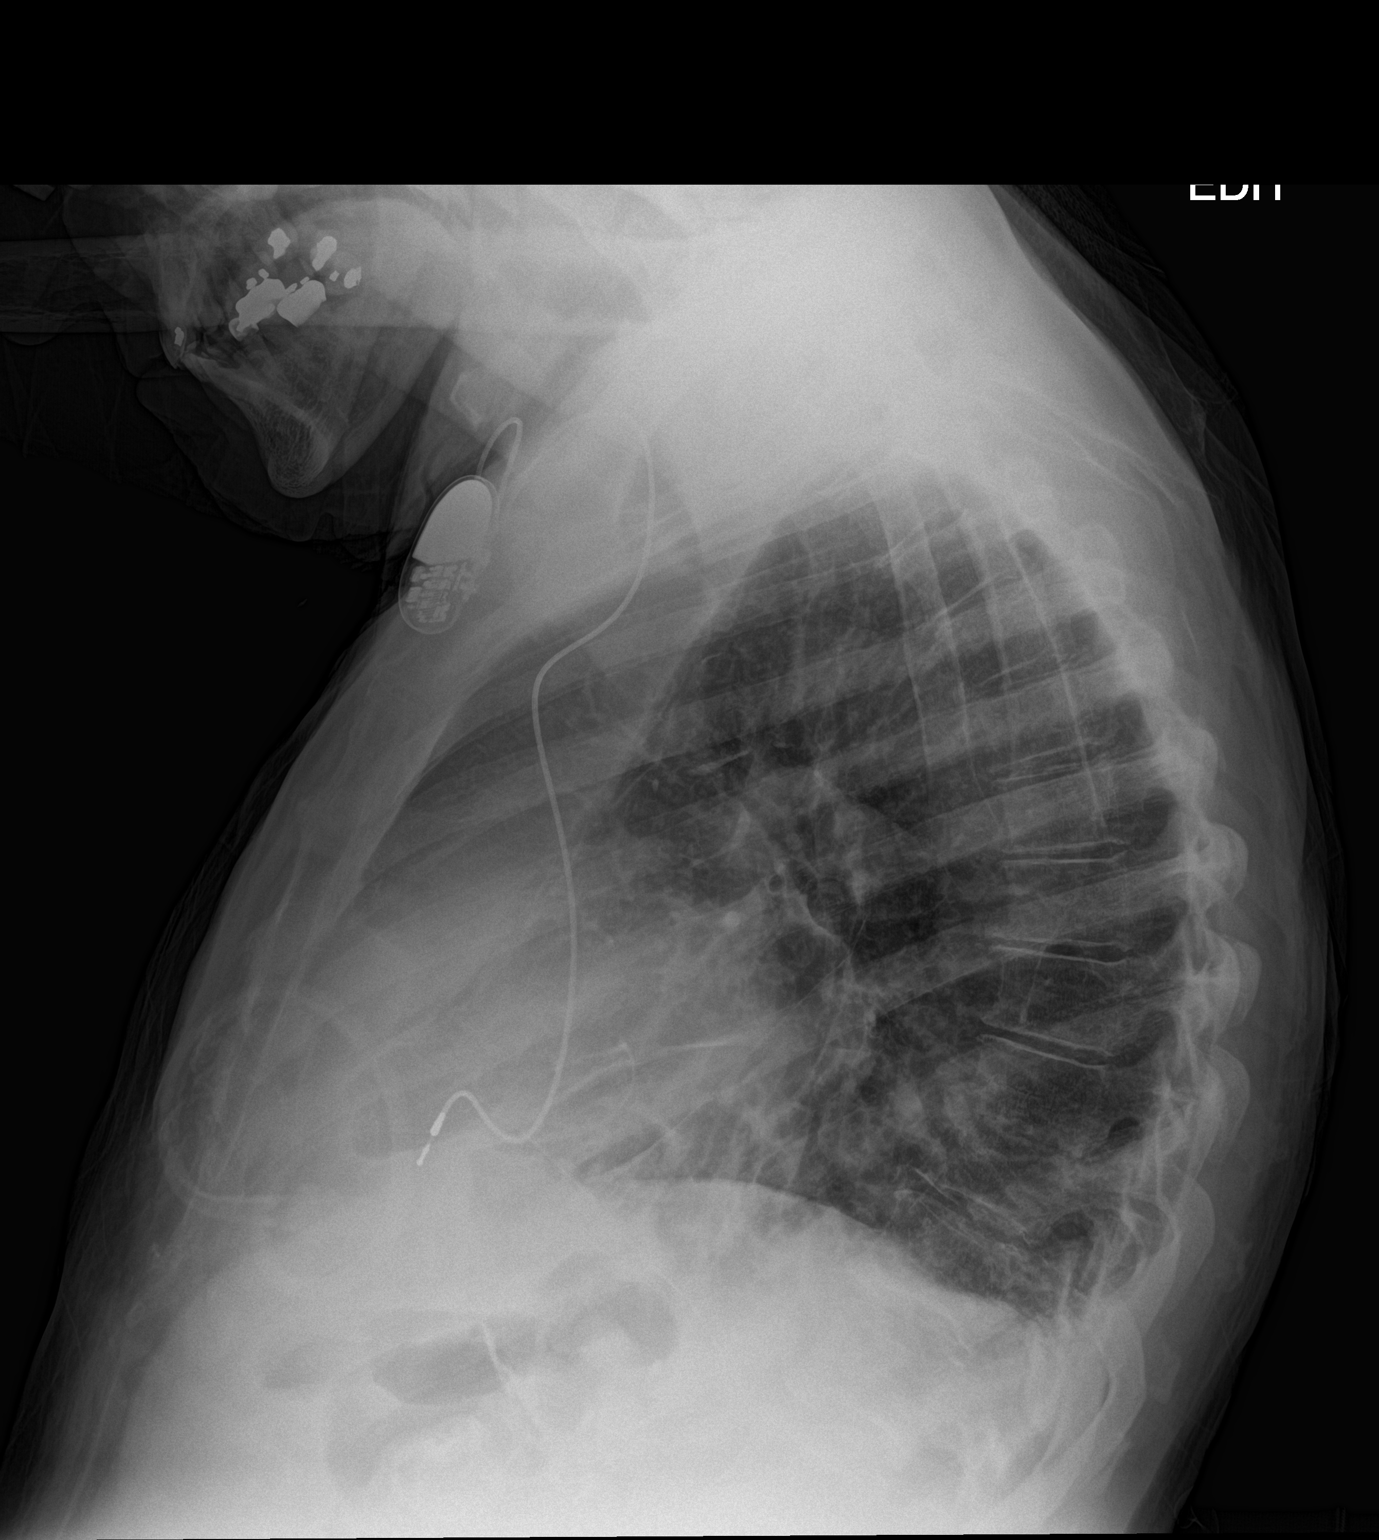

[2 of 2 positions shown; findings below may reference images not displayed]

FINDINGS: Stable cardiac silhouette. RIGHT chest tube in place. There is a
loculated fluid collection along the RIGHT lateral lower hemi
thorax. No pneumothorax. There is atelectasis in the RIGHT lower
lobe. LEFT lung is clear.
IMPRESSION: 1. No interval change.
2. RIGHT chest tube in place with RIGHT pleural effusion.
